# Patient Record
Sex: Male | Born: 1970 | Race: Black or African American | Hispanic: No | Marital: Married | Smoking: Never smoker
Health system: Southern US, Community
[De-identification: ages and names within clinical notes are randomized; demographics above are authoritative.]

## PROBLEM LIST (undated history)

## (undated) DIAGNOSIS — K5790 Diverticulosis of intestine, part unspecified, without perforation or abscess without bleeding: Secondary | ICD-10-CM

## (undated) DIAGNOSIS — I10 Essential (primary) hypertension: Secondary | ICD-10-CM

## (undated) DIAGNOSIS — M542 Cervicalgia: Secondary | ICD-10-CM

## (undated) DIAGNOSIS — N390 Urinary tract infection, site not specified: Secondary | ICD-10-CM

## (undated) DIAGNOSIS — M109 Gout, unspecified: Secondary | ICD-10-CM

## (undated) DIAGNOSIS — Z87442 Personal history of urinary calculi: Secondary | ICD-10-CM

## (undated) DIAGNOSIS — F419 Anxiety disorder, unspecified: Secondary | ICD-10-CM

## (undated) DIAGNOSIS — K219 Gastro-esophageal reflux disease without esophagitis: Secondary | ICD-10-CM

## (undated) DIAGNOSIS — K3184 Gastroparesis: Secondary | ICD-10-CM

## (undated) DIAGNOSIS — G8929 Other chronic pain: Secondary | ICD-10-CM

## (undated) DIAGNOSIS — G4730 Sleep apnea, unspecified: Secondary | ICD-10-CM

## (undated) DIAGNOSIS — M25569 Pain in unspecified knee: Secondary | ICD-10-CM

## (undated) DIAGNOSIS — F32A Depression, unspecified: Secondary | ICD-10-CM

## (undated) DIAGNOSIS — R51 Headache: Secondary | ICD-10-CM

## (undated) DIAGNOSIS — R519 Headache, unspecified: Secondary | ICD-10-CM

## (undated) DIAGNOSIS — M1990 Unspecified osteoarthritis, unspecified site: Secondary | ICD-10-CM

## (undated) DIAGNOSIS — K760 Fatty (change of) liver, not elsewhere classified: Secondary | ICD-10-CM

## (undated) DIAGNOSIS — E119 Type 2 diabetes mellitus without complications: Secondary | ICD-10-CM

## (undated) DIAGNOSIS — R002 Palpitations: Secondary | ICD-10-CM

## (undated) DIAGNOSIS — E669 Obesity, unspecified: Secondary | ICD-10-CM

## (undated) DIAGNOSIS — N200 Calculus of kidney: Secondary | ICD-10-CM

## (undated) DIAGNOSIS — E785 Hyperlipidemia, unspecified: Secondary | ICD-10-CM

## (undated) HISTORY — DX: Fatty (change of) liver, not elsewhere classified: K76.0

## (undated) HISTORY — DX: Palpitations: R00.2

## (undated) HISTORY — DX: Essential (primary) hypertension: I10

## (undated) HISTORY — DX: Unspecified osteoarthritis, unspecified site: M19.90

## (undated) HISTORY — DX: Hyperlipidemia, unspecified: E78.5

## (undated) HISTORY — DX: Sleep apnea, unspecified: G47.30

## (undated) HISTORY — DX: Gout, unspecified: M10.9

## (undated) HISTORY — PX: ESOPHAGOGASTRODUODENOSCOPY ENDOSCOPY: SHX5814

## (undated) HISTORY — DX: Pain in unspecified knee: M25.569

## (undated) HISTORY — DX: Urinary tract infection, site not specified: N39.0

## (undated) HISTORY — PX: KIDNEY STONE SURGERY: SHX686

## (undated) HISTORY — DX: Headache, unspecified: R51.9

## (undated) HISTORY — DX: Obesity, unspecified: E66.9

## (undated) HISTORY — DX: Type 2 diabetes mellitus without complications: E11.9

## (undated) HISTORY — DX: Headache: R51

## (undated) HISTORY — DX: Calculus of kidney: N20.0

---

## 2000-08-10 ENCOUNTER — Emergency Department (HOSPITAL_COMMUNITY): Admission: EM | Admit: 2000-08-10 | Discharge: 2000-08-10 | Payer: Self-pay | Admitting: Emergency Medicine

## 2002-02-13 ENCOUNTER — Emergency Department (HOSPITAL_COMMUNITY): Admission: EM | Admit: 2002-02-13 | Discharge: 2002-02-13 | Payer: Self-pay | Admitting: Emergency Medicine

## 2002-03-03 ENCOUNTER — Emergency Department (HOSPITAL_COMMUNITY): Admission: EM | Admit: 2002-03-03 | Discharge: 2002-03-03 | Payer: Self-pay | Admitting: *Deleted

## 2002-10-19 ENCOUNTER — Encounter: Payer: Self-pay | Admitting: Emergency Medicine

## 2002-10-19 ENCOUNTER — Emergency Department (HOSPITAL_COMMUNITY): Admission: EM | Admit: 2002-10-19 | Discharge: 2002-10-19 | Payer: Self-pay | Admitting: Emergency Medicine

## 2003-01-01 ENCOUNTER — Emergency Department (HOSPITAL_COMMUNITY): Admission: EM | Admit: 2003-01-01 | Discharge: 2003-01-01 | Payer: Self-pay | Admitting: Emergency Medicine

## 2003-02-12 ENCOUNTER — Ambulatory Visit: Admission: RE | Admit: 2003-02-12 | Discharge: 2003-02-12 | Payer: Self-pay | Admitting: Family Medicine

## 2003-05-18 ENCOUNTER — Ambulatory Visit (HOSPITAL_COMMUNITY): Admission: RE | Admit: 2003-05-18 | Discharge: 2003-05-18 | Payer: Self-pay | Admitting: Family Medicine

## 2003-10-11 ENCOUNTER — Ambulatory Visit (HOSPITAL_COMMUNITY): Admission: RE | Admit: 2003-10-11 | Discharge: 2003-10-12 | Payer: Self-pay | Admitting: Family Medicine

## 2004-03-02 ENCOUNTER — Ambulatory Visit (HOSPITAL_COMMUNITY): Admission: RE | Admit: 2004-03-02 | Discharge: 2004-03-02 | Payer: Self-pay | Admitting: Pediatrics

## 2004-03-25 ENCOUNTER — Emergency Department (HOSPITAL_COMMUNITY): Admission: EM | Admit: 2004-03-25 | Discharge: 2004-03-25 | Payer: Self-pay | Admitting: Emergency Medicine

## 2004-09-08 ENCOUNTER — Ambulatory Visit (HOSPITAL_COMMUNITY): Admission: RE | Admit: 2004-09-08 | Discharge: 2004-09-08 | Payer: Self-pay | Admitting: Family Medicine

## 2005-10-12 ENCOUNTER — Emergency Department (HOSPITAL_COMMUNITY): Admission: EM | Admit: 2005-10-12 | Discharge: 2005-10-12 | Payer: Self-pay | Admitting: Emergency Medicine

## 2006-09-05 ENCOUNTER — Ambulatory Visit (HOSPITAL_COMMUNITY): Admission: RE | Admit: 2006-09-05 | Discharge: 2006-09-05 | Payer: Self-pay | Admitting: Family Medicine

## 2007-12-07 ENCOUNTER — Emergency Department (HOSPITAL_COMMUNITY): Admission: EM | Admit: 2007-12-07 | Discharge: 2007-12-07 | Payer: Self-pay | Admitting: Emergency Medicine

## 2009-01-25 ENCOUNTER — Emergency Department (HOSPITAL_COMMUNITY): Admission: EM | Admit: 2009-01-25 | Discharge: 2009-01-25 | Payer: Self-pay | Admitting: Emergency Medicine

## 2009-04-24 ENCOUNTER — Emergency Department (HOSPITAL_COMMUNITY): Admission: EM | Admit: 2009-04-24 | Discharge: 2009-04-24 | Payer: Self-pay | Admitting: Emergency Medicine

## 2009-05-13 ENCOUNTER — Inpatient Hospital Stay (HOSPITAL_COMMUNITY): Admission: EM | Admit: 2009-05-13 | Discharge: 2009-05-16 | Payer: Self-pay | Admitting: Emergency Medicine

## 2009-08-22 ENCOUNTER — Encounter: Admission: RE | Admit: 2009-08-22 | Discharge: 2009-08-22 | Payer: Self-pay | Admitting: Family Medicine

## 2009-09-09 ENCOUNTER — Ambulatory Visit: Payer: Self-pay | Admitting: Adult Health

## 2009-09-09 DIAGNOSIS — I10 Essential (primary) hypertension: Secondary | ICD-10-CM

## 2009-09-09 DIAGNOSIS — G4730 Sleep apnea, unspecified: Secondary | ICD-10-CM

## 2009-09-09 DIAGNOSIS — G4733 Obstructive sleep apnea (adult) (pediatric): Secondary | ICD-10-CM | POA: Insufficient documentation

## 2009-09-09 HISTORY — DX: Essential (primary) hypertension: I10

## 2009-09-09 HISTORY — DX: Sleep apnea, unspecified: G47.30

## 2009-09-29 ENCOUNTER — Encounter: Payer: Self-pay | Admitting: Cardiology

## 2009-09-30 ENCOUNTER — Ambulatory Visit: Payer: Self-pay | Admitting: Cardiology

## 2009-09-30 ENCOUNTER — Encounter: Payer: Self-pay | Admitting: Cardiology

## 2009-09-30 ENCOUNTER — Ambulatory Visit (HOSPITAL_COMMUNITY): Admission: RE | Admit: 2009-09-30 | Discharge: 2009-09-30 | Payer: Self-pay | Admitting: Cardiology

## 2009-09-30 ENCOUNTER — Ambulatory Visit: Payer: Self-pay

## 2009-10-06 ENCOUNTER — Ambulatory Visit: Payer: Self-pay | Admitting: Cardiology

## 2009-10-28 ENCOUNTER — Ambulatory Visit: Admission: RE | Admit: 2009-10-28 | Discharge: 2009-10-28 | Payer: Self-pay | Admitting: Cardiology

## 2009-11-02 ENCOUNTER — Telehealth (INDEPENDENT_AMBULATORY_CARE_PROVIDER_SITE_OTHER): Payer: Self-pay | Admitting: *Deleted

## 2009-11-17 ENCOUNTER — Ambulatory Visit: Payer: Self-pay | Admitting: Cardiology

## 2009-11-18 ENCOUNTER — Encounter: Payer: Self-pay | Admitting: Adult Health

## 2009-11-22 ENCOUNTER — Encounter: Payer: Self-pay | Admitting: Cardiology

## 2010-01-06 ENCOUNTER — Ambulatory Visit: Payer: Self-pay | Admitting: Cardiology

## 2010-02-10 ENCOUNTER — Encounter: Payer: Self-pay | Admitting: Adult Health

## 2010-02-12 ENCOUNTER — Encounter: Payer: Self-pay | Admitting: Family Medicine

## 2010-02-20 ENCOUNTER — Ambulatory Visit
Admission: RE | Admit: 2010-02-20 | Discharge: 2010-02-20 | Payer: Self-pay | Source: Home / Self Care | Attending: Cardiology | Admitting: Cardiology

## 2010-02-21 NOTE — Miscellaneous (Signed)
Summary: Orders Update  Clinical Lists Changes  Orders: Added new Test order of Renal Artery Duplex (Renal Artery Duplex) - Signed 

## 2010-02-21 NOTE — Assessment & Plan Note (Signed)
Summary: 4 wk f/u per checkout on 09/09/09/tg   Visit Type:  Follow-up Primary Provider:  Dr.Mcgowen  CC:  some edema in feet and ankles.  History of Present Illness: Nicholas Turner is a very pleasant morbidly obese AAM who we are seeing on follow-up after evaluation at the request of Dr. Milinda Cave for uncontrolled hypertension.  At that office visit we planned a sleep study, echocardiogram and increased his Doxazosin from 4mg  to 8 mg, and also added Norvac 10mg  daily.  He statea he is feeling better with less headaches and chest fullness.  Unfortunatley the sleep study was not completed or scheduled since last visit. This is to be rescheduled. He also had a renal ultrasound completed but was nondiagnostic secondary to poor images in the setting of large body habitus.  Current Medications (verified): 1)  Oxycodone Hcl 5 Mg Tabs (Oxycodone Hcl) .... Take 1 To 2 Tabs As Needed For Pain 2)  Metoprolol Tartrate 100 Mg Tabs (Metoprolol Tartrate) .... Take 1 Tab Two Times A Day 3)  Allopurinol 300 Mg Tabs (Allopurinol) .... Take 1 Tab Two Times A Day 4)  Glimepiride 4 Mg Tabs (Glimepiride) .... Take 1 Tab Dialy 5)  Colcrys 0.6 Mg Tabs (Colchicine) .... Take 1 Tab Two Times A Day 6)  Pravastatin Sodium 20 Mg Tabs (Pravastatin Sodium) .... Take 1 Tab Daily 7)  Lisinopril 20 Mg Tabs (Lisinopril) .... Take 1 Tab Two Times A Day 8)  Hydrochlorothiazide 25 Mg Tabs (Hydrochlorothiazide) .... Take 1 Tab Daily 9)  Doxazosin Mesylate 8 Mg Tabs (Doxazosin Mesylate) .... Take 1 Tablet By Mouth Once Daily 10)  Norvasc 10 Mg Tabs (Amlodipine Besylate) .... Take 1 Tablet By Mouth Once Daily  Allergies (verified): No Known Drug Allergies  Review of Systems       All other systems have been reviewed and are negative unless stated above.   Vital Signs:  Patient profile:   40 year old male Weight:      332 pounds BMI:     50.66 Pulse rate:   69 / minute BP sitting:   153 / 95  (right arm)  Vitals Entered By:  Dreama Saa, CNA (October 06, 2009 3:05 PM)  Physical Exam  General:  Well developed, well nourished, in no acute distress. Lungs:  Clear bilaterally to auscultation and percussion. Heart:  Distant RRR Abdomen:  Obese, nontender with normal bowel sounds Msk:  Back normal, normal gait. Muscle strength and tone normal. Extremities:  trace left pedal edema and trace right pedal edema.   Neurologic:  Alert and oriented x 3. Psych:  Normal affect.   Impression & Recommendations:  Problem # 1:  HYPERTENSION, UNCONTROLLED (ICD-401.9) His blood pressure is significantly improved with medication changes!  We will make sure before he leaves today to have sleep study completed.  Echocardiogram demonstrated milld LVH, EF 55%-60%, No WMA. Diastolic dysfx was noted, Grade 2. I have counseled him on low sodium diet, written instructions were provided to take home.  I am encourged by his BP improvemnt, but will not add more medications until after sleep study is completed. His updated medication list for this problem includes:    Metoprolol Tartrate 100 Mg Tabs (Metoprolol tartrate) .Marland Kitchen... Take 1 tab two times a day    Lisinopril 20 Mg Tabs (Lisinopril) .Marland Kitchen... Take 1 tab two times a day    Hydrochlorothiazide 25 Mg Tabs (Hydrochlorothiazide) .Marland Kitchen... Take 1 tab daily    Doxazosin Mesylate 8 Mg Tabs (Doxazosin mesylate) .Marland Kitchen... Take  1 tablet by mouth once daily    Norvasc 10 Mg Tabs (Amlodipine besylate) .Marland Kitchen... Take 1 tablet by mouth once daily  Problem # 2:  SLEEP APNEA (ICD-780.57) As above.  Patient Instructions: 1)  Your physician recommends that you schedule a follow-up appointment in: post sleep study 2)  Your physician recommends that you continue on your current medications as directed. Please refer to the Current Medication list given to you today. 3)  Your physician has recommended that you have a sleep study.  This test records several body functions during sleep, including:  brain activity, eye  movement, oxygen and carbon dioxide blood levels, heart rate and rhythm, breathing rate and rhythm, the flow of air through your mouth and nose, snoring, body muscle movements, and chest and belly movement. 4)  You have been given a copy of the Dash diet to help you cut down on sodium and improve yor blood pressure

## 2010-02-21 NOTE — Progress Notes (Signed)
Summary: WAS TOLD TO CALL AFTER SLEPP STUDY WAS DONE  Phone Note Call from Patient Call back at Home Phone [phone]   Caller: PT Reason for Call: Talk to Nurse Summary of Call: PT WAS TOLD TO CALL BACK AFTER HAVING SLEEP STUDY DONE. IT WAS DONE LAST FRIDAY AND THEY ALREADY HAVE RESULTS. PT IS UNSURE WHY KATHRYN LAWRENCE WANTED HIM TO CALL BACK.  I TOLD PT I WOULD HAVE A NURSE TALK TO KATHRYN ON THURSDAY AND SEE WHAT NEEDS TO HAPPEN AND GIVE HIM A CALL BACK. Initial call taken by: Faythe Ghee,  November 02, 2009 2:45 PM  Follow-up for Phone Call        sent message to [facility] [phone] Follow-up by: Teressa Lower RN,  November 04, 2009 4:49 PM  Additional Follow-up for Phone Call Additional follow up Details #1::        [facility] for pt to schedule follow up for results of sleep study Additional Follow-up by: Teressa Lower RN,  November 08, 2009 2:18 PM

## 2010-02-21 NOTE — Assessment & Plan Note (Signed)
Summary: NP6 UNCONTROLLED HYPERTENTION   Visit Type:  Initial Consult Primary Provider:  Dr.Mcgowen  CC:  edema occasionally in feet and legs.  History of Present Illness: [name] is a 40 year old obese AAM who we are seeing on consult as the result of difficult ot control hypertension on multiple medical trails. 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  Since discharge he has been continually hypertensive.  We are asked to evaluate and make recommendations.  He denies chest pain, but has chronic musculoskeletal pain, especially in his knees.  He is not able to exercise as a result of this. He admits to snoring and difficulty sleeping.  He has not had a cardiac w/u in the past.    Preventive Screening-Counseling & Management  Alcohol-Tobacco     Alcohol drinks/day: 0     Smoking Status: never  Caffeine-Diet-Exercise     Diet Counseling: to improve diet; diet is suboptimal     Does Patient Exercise: no  Current Medications (verified): 1)  Oxycodone Hcl 5 Mg Tabs (Oxycodone Hcl) .... Take 1 To 2 Tabs As Needed For Pain 2)  Metoprolol Tartrate 100 Mg Tabs (Metoprolol Tartrate) .... Take 1 Tab Two Times A Day 3)  Allopurinol 300 Mg Tabs (Allopurinol) .... Take 1 Tab Two Times A Day 4)  Glimepiride 4 Mg Tabs (Glimepiride) .... Take 1 Tab Dialy 5)  Colcrys 0.6 Mg Tabs (Colchicine) .... Take 1 Tab Two Times A Day 6)  Pravastatin Sodium 20 Mg Tabs (Pravastatin Sodium) .... Take 1 Tab Daily 7)  Lisinopril 20 Mg Tabs (Lisinopril) .... Take 1 Tab Two Times A Day 8)  Hydrochlorothiazide 25 Mg Tabs (Hydrochlorothiazide) .... Take 1 Tab Daily 9)  Doxazosin Mesylate 8 Mg Tabs (Doxazosin Mesylate) .... Take 1 Tablet By Mouth Once Daily 10)  Norvasc 10 Mg Tabs (Amlodipine Besylate) .... Take 1 Tablet By Mouth Once Daily  Allergies (verified): No Known Drug Allergies  Family History: Father:deceased in his 87's due to presumed  myocardial infarction ESRD, Hypertension Mother:alive age 67 hypertension and some thyroid problems  Social History: Full Time-P[employer] Married  Tobacco Use - No.  Alcohol Use - no Regular Exercise - no Drug Use - no Alcohol drinks/day:  0  Review of Systems       Musculoskeletal pain.  Fatigue  All other systems have been reviewed and are negative unless stated above.   Vital Signs:  Patient profile:   40 year old male Height:      68 inches Weight:      327 pounds BMI:     49.90 Pulse rate:   58 / minute BP sitting:   198 / 105  (right arm)  Vitals Entered By: Dreama Saa, CNA (September 09, 2009 2:13 PM)  Physical Exam  General:  Well developed, well nourished, in no acute distress. Head:  normocephalic and atraumatic Eyes:  PERRLA/EOM intact; conjunctiva and lids normal. Neck:  Obese Lungs:  Clear bilaterally to auscultation and percussion. Heart:  Distant HS, unable to hear S4 murmur Abdomen:  Bowel sounds positive; abdomen soft and non-tender without masses, organomegaly, or hernias noted. No hepatosplenomegaly.Obese Msk:  Back normal, normal gait. Muscle strength and tone normal. Pulses:  pulses normal in all 4 extremities Extremities:  No clubbing or cyanosis. Neurologic:  Alert and oriented x 3. Psych:  Normal affect.   EKG  Procedure date:  09/09/2009  Findings:      Normal sinus  rhythm with rate of: 63 bpm Non-specific ST-T wave changes noted.  Left ventricular hypertrophy.    Impression & Recommendations:  Problem # 1:  HYPERTENSION, UNCONTROLLED (ICD-401.9) Mr. 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  We will add Norvasc 10mg  daily, increase doxazosin to 8mg  from 4 mg daily.  A renal ultrasound will be completed at the [facility] cardiology office along with an ECHO for LV fx.  High risk for end organ damange if BP is not controlled This  has been discussed with the patient and his wife by me and by Dr. Dietrich Pates. He is to bring his BP cuff with him on next visit to compare readings to ours. His updated medication list for this problem includes:    Metoprolol Tartrate 100 Mg Tabs (Metoprolol tartrate) .Marland Kitchen... Take 1 tab two times a day    Lisinopril 20 Mg Tabs (Lisinopril) .Marland Kitchen... Take 1 tab two times a day    Hydrochlorothiazide 25 Mg Tabs (Hydrochlorothiazide) .Marland Kitchen... Take 1 tab daily    Doxazosin Mesylate 8 Mg Tabs (Doxazosin mesylate) .Marland Kitchen... Take 1 tablet by mouth once daily    Norvasc 10 Mg Tabs (Amlodipine besylate) .Marland Kitchen... Take 1 tablet by mouth once daily  Orders: Renal Artery Duplex (Renal Artery Duplex) 2-D Echocardiogram (2D Echo)  Problem # 2:  SLEEP APNEA (ICD-780.57) He admits to snoring and his wife states he stops breathing.  He certainly has the body habitus to have this syndrome.  Sleep study will be completed for evaluation for sleep apnea.  Wt loss is strongly recommended, with consideration for bariatric surgery if necessary. Orders: Sleep Study Other (Sleep Study Other)  Patient Instructions: 1)  Your physician recommends that you schedule a follow-up appointment in: 2 to 3 weeks (post tests) 2)  Your physician has recommended you make the following change in your medication: Start taking Norvasc (amlodipine 10mg  by mouth once daily  and increase Doxazosin to 8mg  by mouth once daily  3)  Your physician has requested that you have a renal artery duplex. During this test, an ultrasound is used to evaluate blood flow to the kidneys. Allow one hour for this exam. Do not eat after midnight the day before and avoid carbonated beverages. Take your medications as you usually do. 4)  Your physician has requested that you have an echocardiogram.  Echocardiography is a painless test that uses sound waves to create images of your heart. It provides your doctor with information about the size and shape of your heart and how well  your heart's chambers and valves are working.  This procedure takes approximately one hour. There are no restrictions for this procedure. 5)  Your physician has recommended that you have a sleep study.  This test records several body functions during sleep, including:  brain activity, eye movement, oxygen and carbon dioxide blood levels, heart rate and rhythm, breathing rate and rhythm, the flow of air through your mouth and nose, snoring, body muscle movements, and chest and belly movement. Prescriptions: DOXAZOSIN MESYLATE 8 MG TABS (DOXAZOSIN MESYLATE) take 1 tablet by mouth once daily  #30 x 3   Entered by:   Larita Fife Via LPN   Authorized by:   Joni Reining, NP   Signed by:   Larita Fife Via LPN on 16/10/9602   Method used:   Print then Give to Patient   RxID:   [id number] NORVASC 10 MG TABS (AMLODIPINE BESYLATE)  take 1 tablet by mouth once daily  #30 x 3   Entered by:   Larita Fife Via LPN   Authorized by:   Joni Reining, NP   Signed by:   Larita Fife Via LPN on 16/10/9602   Method used:   Print then Give to Patient   RxID:   [id number]

## 2010-02-21 NOTE — Assessment & Plan Note (Signed)
Summary: rov post sleep study   Primary Provider:  Dr.Mcgowen   History of Present Illness: Nicholas Turner is a pleasnt 40 y/o morbidly obese AAF with history of difficult to control hypertension on multiple medications and diabetes.  We sent him for a sleep study on last visit and he is here to discuss the results.  He has been medically compliant and is without complaint.  Current Medications (verified): 1)  Oxycodone Hcl 5 Mg Tabs (Oxycodone Hcl) .... Take 1 To 2 Tabs As Needed For Pain 2)  Metoprolol Tartrate 100 Mg Tabs (Metoprolol Tartrate) .... Take 1 Tab Two Times A Day 3)  Allopurinol 300 Mg Tabs (Allopurinol) .... Take 1 Tab Two Times A Day 4)  Glimepiride 4 Mg Tabs (Glimepiride) .... Take 1 Tab Dialy 5)  Colcrys 0.6 Mg Tabs (Colchicine) .... Take 1 Tab Two Times A Day 6)  Pravastatin Sodium 20 Mg Tabs (Pravastatin Sodium) .... Take 1 Tab Daily 7)  Lisinopril 20 Mg Tabs (Lisinopril) .... Take 1 Tab Two Times A Day 8)  Hydrochlorothiazide 25 Mg Tabs (Hydrochlorothiazide) .... Take 1 Tab Daily 9)  Doxazosin Mesylate 8 Mg Tabs (Doxazosin Mesylate) .... Take 1 Tablet By Mouth Once Daily 10)  Norvasc 10 Mg Tabs (Amlodipine Besylate) .... Take 1 Tablet By Mouth Once Daily  Allergies (verified): No Known Drug Allergies  Comments:  Nurse/Medical Assistant: patient reviewed meds from previous ov and stated meds are correct  Review of Systems       All other systems have been reviewed and are negative unless stated above.   Vital Signs:  Patient profile:   40 year old male Weight:      341 pounds BMI:     52.04 O2 Sat:      97 % on Room air Pulse rate:   67 / minute BP sitting:   158 / 98  (right arm)  Vitals Entered By: Dreama Saa, CNA (November 17, 2009 3:45 PM)  O2 Flow:  Room air  Physical Exam  General:  Well developed, well nourished, in no acute distress. Lungs:  Clear bilaterally to auscultation and percussion. Heart:  Non-displaced PMI, chest non-tender;  regular rate and rhythm, S1, S2 without murmurs, rubs or gallops. Carotid upstroke normal, no bruit. Normal abdominal aortic size, no bruits. Femorals normal pulses, no bruits. Pedals normal pulses. No edema, no varicosities. Pulses:  pulses normal in all 4 extremities Extremities:  No clubbing or cyanosis. Psych:  Normal affect.   Impression & Recommendations:  Problem # 1:  SLEEP APNEA (ICD-780.57) Review of sleep study completed on 10/28/2009 demonstrated moderate obstructive sleep apnea.  Average HR of 56 bpm with no significant dysrhythmias noted.  O2 saturation at lowest was 76% during REM sleep.  I have advised CPAP fitting.  He agrees to try this and is willing to proceed with fitting at [facility] in [place].Marland Kitchen He will follow up with primary care physician for continued assessment. Orders: Durable Medical Equipment (DME)  Problem # 2:  HYPERTENSION, UNCONTROLLED (ICD-401.9) Continues moderately elevated. Will not adjust meds at this time as CPAP will be institued.  If sleep apnea improves will need to titrate medications down to prevent hyptension.  Will will followhim in one month and 3 months intervals. His updated medication list for this problem includes:    Metoprolol Tartrate 100 Mg Tabs (Metoprolol tartrate) .Marland Kitchen... Take 1 tab two times a day    Lisinopril 20 Mg Tabs (Lisinopril) .Marland Kitchen... Take 1 tab two times a day  Hydrochlorothiazide 25 Mg Tabs (Hydrochlorothiazide) .Marland Kitchen... Take 1 tab daily    Doxazosin Mesylate 8 Mg Tabs (Doxazosin mesylate) .Marland Kitchen... Take 1 tablet by mouth once daily    Norvasc 10 Mg Tabs (Amlodipine besylate) .Marland Kitchen... Take 1 tablet by mouth once daily  Patient Instructions: 1)  Your physician recommends that you schedule a follow-up appointment in: after cpap machine

## 2010-02-23 NOTE — Assessment & Plan Note (Signed)
Summary: [id number]   Visit Type:  Follow-up Primary Provider:  Dr.Mcgowen  CC:  no cardiology complaints.  History of Present Illness: Nicholas Turner is a friendly AAM we are following for uncontrolled hypertension. Since last visit he was sent for a sleep study as he is morbidly obese and often snores.  He was found to have moderate sleep apnea and qualifies for CPAP.  He is without complaint and is feeling better.    Current Medications (verified): 1)  Metoprolol Tartrate 100 Mg Tabs (Metoprolol Tartrate) .... Take 1 Tab Two Times A Day 2)  Allopurinol 300 Mg Tabs (Allopurinol) .... Take 1 Tab Two Times A Day 3)  Glimepiride 4 Mg Tabs (Glimepiride) .... Take 1 Tab Dialy 4)  Colcrys 0.6 Mg Tabs (Colchicine) .... Take 1 Tab Two Times A Day 5)  Pravastatin Sodium 20 Mg Tabs (Pravastatin Sodium) .... Take 1 Tab Daily 6)  Lisinopril 20 Mg Tabs (Lisinopril) .... Take 1 Tab Two Times A Day 7)  Hydrochlorothiazide 25 Mg Tabs (Hydrochlorothiazide) .... Take 1 Tab Daily 8)  Doxazosin Mesylate 8 Mg Tabs (Doxazosin Mesylate) .... Take 1 Tablet By Mouth Once Daily 9)  Norvasc 10 Mg Tabs (Amlodipine Besylate) .... Take 1 Tablet By Mouth Once Daily  Allergies (verified): No Known Drug Allergies  Comments:  Nurse/Medical Assistant: [facility] in [place] is patients pharmacy reviewed meds from previous ov  Review of Systems       All other systems have been reviewed and are negative unless stated above.    Vital Signs:  Patient profile:   40 year old male Weight:      347 pounds O2 Sat:      95 % on Room air Pulse rate:   68 / minute BP sitting:   139 / 91  (right arm)  Vitals Entered By: Dreama Saa, CNA (January 06, 2010 3:07 PM)  O2 Flow:  Room air  Physical Exam  General:  Well developed, well nourished, in no acute distress. Lungs:  Clear bilaterally to auscultation and percussion. Heart:  Non-displaced PMI, chest non-tender; regular rate and rhythm, S1, S2 without murmurs, rubs  or gallops. Carotid upstroke normal, no bruit. Normal abdominal aortic size, no bruits. Femorals normal pulses, no bruits. Pedals normal pulses. No edema, no varicosities. Abdomen:  Bowel sounds positive; abdomen soft and non-tender without masses, organomegaly, or hernias noted. No hepatosplenomegaly. Extremities:  No clubbing or cyanosis. Neurologic:  Alert and oriented x 3.   Impression & Recommendations:  Problem # 1:  SLEEP APNEA (ICD-780.57) He is having a great deal of difficulty obtaining a CPAP machine.  He has no insurance.  We are referring him to [facility] who are willing to work with him and give him a discount concerning the cost of the the machine.  He will follow-up within a month of beginning this therapy.  Problem # 2:  HYPERTENSION, UNCONTROLLED (ICD-401.9) Much better controlled on current medications.  I am pleased with his progress despite not having the CPAP on board to assist.  Once he able to percure CPAP he will need follow-up in one month to titrate medications. He is also encouraged to lose wt and exercise more. His updated medication list for this problem includes:    Metoprolol Tartrate 100 Mg Tabs (Metoprolol tartrate) .Marland Kitchen... Take 1 tab two times a day    Lisinopril 20 Mg Tabs (Lisinopril) .Marland Kitchen... Take 1 tab two times a day    Hydrochlorothiazide 25 Mg Tabs (Hydrochlorothiazide) .Marland Kitchen... Take 1  tab daily    Doxazosin Mesylate 8 Mg Tabs (Doxazosin mesylate) .Marland Kitchen... Take 1 tablet by mouth once daily    Norvasc 10 Mg Tabs (Amlodipine besylate) .Marland Kitchen... Take 1 tablet by mouth once daily  Other Orders: Durable Medical Equipment (DME) Home Health Referral (Home Health)  Patient Instructions: 1)  Your physician recommends that you schedule a follow-up appointment in: 1 month 2)  Your physician recommends that you continue on your current medications as directed. Please refer to the Current Medication list given to you today. 3)  You have been referred to [facility]  for CPAP machine.  Appended Document: Orders Update    Clinical Lists Changes  Orders: Added new Test order of Durable Medical Equipment (DME) - Signed

## 2010-02-23 NOTE — Miscellaneous (Signed)
Summary: CPAP  CPAP   Imported By: Faythe Ghee 02/10/2010 11:06:43  _____________________________________________________________________  External Attachment:    Type:   Image     Comment:   External Document

## 2010-03-01 NOTE — Assessment & Plan Note (Signed)
Summary: 1 mth f/u per checkout on 01/06/10/tg   Visit Type:  Follow-up Primary Provider:  Dr.Mcgowen   History of Present Illness: Nicholas Turner is a very nice morbidly obese AAM we are following for difficult to control hypertension, OSA recently placed on CPAP.  He has recently obtained CPAP and has been using it over the last few weeks. He expresses much better sleep and energy levels.  His sleep has not been interrupted and he is very pleased with this.  He is without complaints.  Current Medications (verified): 1)  Metoprolol Tartrate 100 Mg Tabs (Metoprolol Tartrate) .... Take 1 Tab Two Times A Day 2)  Allopurinol 300 Mg Tabs (Allopurinol) .... Take 1 Tab Two Times A Day 3)  Glimepiride 4 Mg Tabs (Glimepiride) .... Take 1 Tab Dialy 4)  Colcrys 0.6 Mg Tabs (Colchicine) .... Take 1 Tab Two Times A Day 5)  Pravastatin Sodium 20 Mg Tabs (Pravastatin Sodium) .... Take 1 Tab Daily 6)  Lisinopril 20 Mg Tabs (Lisinopril) .... Take 1 Tab Two Times A Day 7)  Hydrochlorothiazide 25 Mg Tabs (Hydrochlorothiazide) .... Take 1 Tab Daily 8)  Doxazosin Mesylate 8 Mg Tabs (Doxazosin Mesylate) .... Take 1 Tablet By Mouth Once Daily 9)  Norvasc 10 Mg Tabs (Amlodipine Besylate) .... Take 1 Tablet By Mouth Once Daily  Allergies (verified): No Known Drug Allergies  Comments:  Nurse/Medical Assistant: patient and i reviewed meds no list brought metoprolol bottle he is now on 100 mg two times a day instead of 200 mg two times a day [facility] in [place]  Review of Systems       All other systems have been reviewed and are negative unless stated above.   Vital Signs:  Patient profile:   40 year old male Weight:      358 pounds BMI:     54.63 O2 Sat:      95 % on Room air Pulse rate:   86 / minute BP sitting:   136 / 81  (left arm)  Vitals Entered By: Dreama Saa, CNA (February 20, 2010 2:57 PM)  O2 Flow:  Room air  Physical Exam  General:  Well developed, well nourished, in no acute  distress.obese.   Lungs:  Clear bilaterally to auscultation and percussion. Heart:  Non-displaced PMI, chest non-tender; regular rate and rhythm, S1, S2 without murmurs, rubs or gallops. Carotid upstroke normal, no bruit. Normal abdominal aortic size, no bruits. Femorals normal pulses, no bruits. Pedals normal pulses. No edema, no varicosities. Abdomen:  Bowel sounds positive; abdomen soft and non-tender without masses, organomegaly, or hernias noted. No hepatosplenomegaly. Msk:  Back normal, normal gait. Muscle strength and tone normal. Pulses:  pulses normal in all 4 extremities Extremities:  No clubbing or cyanosis. Neurologic:  Alert and oriented x 3. Psych:  Normal affect.   Impression & Recommendations:  Problem # 1:  HYPERTENSION, UNCONTROLLED (ICD-401.9) Blood pressure remains controlled.  I am pleased with this as he is going to have improvement, hopfully every visit since now on CPAP.  He has unforutnately gained about 10 lbs since last visit, before the holidays.  Now that he has better energy he is advised to begin an exercise program. He has a stationary bike at home which he now plans to begin using.  I have advised a day of exercise on this. When the weather gets better, he should try and be outside more and walk.  He verbalizes understanding. Will watch BP closely as use of CPAP  and wt loss my decrease BP.  I will see him every 3 months for a while to manage this. His updated medication list for this problem includes:    Metoprolol Tartrate 100 Mg Tabs (Metoprolol tartrate) .Marland Kitchen... Take 1 tab two times a day    Lisinopril 20 Mg Tabs (Lisinopril) .Marland Kitchen... Take 1 tab two times a day    Hydrochlorothiazide 25 Mg Tabs (Hydrochlorothiazide) .Marland Kitchen... Take 1 tab daily    Doxazosin Mesylate 8 Mg Tabs (Doxazosin mesylate) .Marland Kitchen... Take 1 tablet by mouth once daily    Norvasc 10 Mg Tabs (Amlodipine besylate) .Marland Kitchen... Take 1 tablet by mouth once daily  Problem # 2:  SLEEP APNEA  (ICD-780.57) Use of CPAP is just beginning. He is to continue this every PM to promote better sleep and to keep BP controlled.  Patient Instructions: 1)  Your physician recommends that you schedule a follow-up appointment in: 3 months 2)  Your physician recommends that you continue on your current medications as directed. Please refer to the Current Medication list given to you today. Prescriptions: NORVASC 10 MG TABS (AMLODIPINE BESYLATE) take 1 tablet by mouth once daily  #30 x 12   Entered by:   Larita Fife Via LPN   Authorized by:   Joni Reining, NP   Signed by:   Larita Fife Via LPN on 56/21/3086   Method used:   Electronically to        [facility]  [address] (retail)       [address]       [place], [place]  [address]       Ph: [phone]       Fax: [phone]   RxID:   [id number] DOXAZOSIN MESYLATE 8 MG TABS (DOXAZOSIN MESYLATE) take 1 tablet by mouth once daily  #30 x 12   Entered by:   Larita Fife Via LPN   Authorized by:   Joni Reining, NP   Signed by:   Larita Fife Via LPN on 87/56/4332   Method used:   Electronically to        [facility]  [address] (retail)       [address]       [place], [place]  [address]       Ph: [phone]       Fax: [phone]   RxID:   [id number] HYDROCHLOROTHIAZIDE 25 MG TABS (HYDROCHLOROTHIAZIDE) take 1 tab daily  #30 x 12   Entered by:   Larita Fife Via LPN   Authorized by:   Joni Reining, NP   Signed by:   Larita Fife Via LPN on 83/15/1761   Method used:   Electronically to        [facility]  [address] (retail)       [address]       [place], [place]  [address]       Ph: [phone]       Fax: [phone]   RxID:   [id number] LISINOPRIL 20 MG TABS (LISINOPRIL) take 1 tab two times a day  #60 x 12   Entered by:   Larita Fife Via LPN   Authorized by:   Joni Reining, NP   Signed by:   Larita Fife Via LPN on 81/01/7508   Method used:   Electronically to        [facility]  [address] (retail)  [address]       [place], [place]  [address]       Ph: [phone]       Fax: [phone]   RxID:   [id number] PRAVASTATIN SODIUM 20 MG TABS (PRAVASTATIN SODIUM) take 1 tab daily  #30 x 12   Entered by:   Larita Fife Via LPN   Authorized by:   Joni Reining, NP   Signed by:   Larita Fife Via LPN on 87/56/4332   Method used:   Electronically to        [facility]  [address] (retail)       [address]       [place], [place]  [address]       Ph: [phone]       Fax: [phone]   RxID:   [id number] METOPROLOL TARTRATE 100 MG TABS (METOPROLOL TARTRATE) take 1 tab two times a day  #60 x 12   Entered by:   Larita Fife Via LPN   Authorized by:   Joni Reining, NP   Signed by:   Larita Fife Via LPN on 83/15/1761   Method used:   Electronically to        [facility]  [address] (retail)       [address]       [place], [place]  [address]       Ph: [phone]       Fax: [phone]   RxID:   [id number]

## 2010-03-20 ENCOUNTER — Encounter: Payer: Self-pay | Admitting: Cardiology

## 2010-04-11 LAB — URINALYSIS, ROUTINE W REFLEX MICROSCOPIC
Glucose, UA: 100 mg/dL — AB
Ketones, ur: NEGATIVE mg/dL
Leukocytes, UA: NEGATIVE
Nitrite: NEGATIVE
Protein, ur: 100 mg/dL — AB
Specific Gravity, Urine: >1.03 — ABNORMAL HIGH (ref 1.005–1.030)
Urobilinogen, UA: 4 mg/dL — ABNORMAL HIGH (ref 0.0–1.0)
pH: 5.5 (ref 5.0–8.0)

## 2010-04-11 LAB — DIFFERENTIAL
Basophils Absolute: 0 10*3/uL (ref 0.0–0.1)
Basophils Absolute: 0 10*3/uL (ref 0.0–0.1)
Basophils Absolute: 0 10*3/uL (ref 0.0–0.1)
Basophils Relative: 0 % (ref 0–1)
Basophils Relative: 0 % (ref 0–1)
Basophils Relative: 0 % (ref 0–1)
Eosinophils Absolute: 0 10*3/uL (ref 0.0–0.7)
Eosinophils Absolute: 0 10*3/uL (ref 0.0–0.7)
Eosinophils Absolute: 0.1 10*3/uL (ref 0.0–0.7)
Eosinophils Relative: 0 % (ref 0–5)
Eosinophils Relative: 0 % (ref 0–5)
Eosinophils Relative: 0 % (ref 0–5)
Lymphocytes Relative: 22 % (ref 12–46)
Lymphocytes Relative: 8 % — ABNORMAL LOW (ref 12–46)
Lymphocytes Relative: 8 % — ABNORMAL LOW (ref 12–46)
Lymphs Abs: 1 10*3/uL (ref 0.7–4.0)
Lymphs Abs: 1.1 10*3/uL (ref 0.7–4.0)
Lymphs Abs: 3 10*3/uL (ref 0.7–4.0)
Monocytes Absolute: 0.2 10*3/uL (ref 0.1–1.0)
Monocytes Absolute: 0.5 10*3/uL (ref 0.1–1.0)
Monocytes Absolute: 0.7 10*3/uL (ref 0.1–1.0)
Monocytes Relative: 2 % — ABNORMAL LOW (ref 3–12)
Monocytes Relative: 4 % (ref 3–12)
Monocytes Relative: 5 % (ref 3–12)
Neutro Abs: 10.2 10*3/uL — ABNORMAL HIGH (ref 1.7–7.7)
Neutro Abs: 10.4 10*3/uL — ABNORMAL HIGH (ref 1.7–7.7)
Neutro Abs: 12.3 10*3/uL — ABNORMAL HIGH (ref 1.7–7.7)
Neutrophils Relative %: 73 % (ref 43–77)
Neutrophils Relative %: 87 % — ABNORMAL HIGH (ref 43–77)
Neutrophils Relative %: 90 % — ABNORMAL HIGH (ref 43–77)

## 2010-04-11 LAB — CBC
HCT: 33.8 % — ABNORMAL LOW (ref 39.0–52.0)
HCT: 37.7 % — ABNORMAL LOW (ref 39.0–52.0)
HCT: 38.1 % — ABNORMAL LOW (ref 39.0–52.0)
Hemoglobin: 11.4 g/dL — ABNORMAL LOW (ref 13.0–17.0)
Hemoglobin: 12.8 g/dL — ABNORMAL LOW (ref 13.0–17.0)
Hemoglobin: 12.8 g/dL — ABNORMAL LOW (ref 13.0–17.0)
MCHC: 33.7 g/dL (ref 30.0–36.0)
MCHC: 33.7 g/dL (ref 30.0–36.0)
MCHC: 33.9 g/dL (ref 30.0–36.0)
MCV: 88 fL (ref 78.0–100.0)
MCV: 88.2 fL (ref 78.0–100.0)
MCV: 88.7 fL (ref 78.0–100.0)
Platelets: 329 10*3/uL (ref 150–400)
Platelets: 353 10*3/uL (ref 150–400)
Platelets: 359 10*3/uL (ref 150–400)
RBC: 3.81 MIL/uL — ABNORMAL LOW (ref 4.22–5.81)
RBC: 4.27 MIL/uL (ref 4.22–5.81)
RBC: 4.33 MIL/uL (ref 4.22–5.81)
RDW: 12.7 % (ref 11.5–15.5)
RDW: 12.7 % (ref 11.5–15.5)
RDW: 12.8 % (ref 11.5–15.5)
WBC: 11.9 10*3/uL — ABNORMAL HIGH (ref 4.0–10.5)
WBC: 13.6 10*3/uL — ABNORMAL HIGH (ref 4.0–10.5)
WBC: 13.9 10*3/uL — ABNORMAL HIGH (ref 4.0–10.5)

## 2010-04-11 LAB — LIPID PANEL
Cholesterol: 233 mg/dL — ABNORMAL HIGH (ref 0–200)
HDL: 32 mg/dL — ABNORMAL LOW (ref >39)
LDL Cholesterol: 177 mg/dL — ABNORMAL HIGH (ref 0–99)
Total CHOL/HDL Ratio: 7.3 RATIO
Triglycerides: 121 mg/dL (ref <150)
VLDL: 24 mg/dL (ref 0–40)

## 2010-04-11 LAB — HEPATIC FUNCTION PANEL
ALT: 44 U/L (ref 0–53)
AST: 23 U/L (ref 0–37)
Albumin: 2.6 g/dL — ABNORMAL LOW (ref 3.5–5.2)
Alkaline Phosphatase: 129 U/L — ABNORMAL HIGH (ref 39–117)
Bilirubin, Direct: 0.2 mg/dL (ref 0.0–0.3)
Indirect Bilirubin: 0.5 mg/dL (ref 0.3–0.9)
Total Bilirubin: 0.7 mg/dL (ref 0.3–1.2)
Total Protein: 7.8 g/dL (ref 6.0–8.3)

## 2010-04-11 LAB — GLUCOSE, CAPILLARY
Glucose-Capillary: 117 mg/dL — ABNORMAL HIGH (ref 70–99)
Glucose-Capillary: 147 mg/dL — ABNORMAL HIGH (ref 70–99)
Glucose-Capillary: 147 mg/dL — ABNORMAL HIGH (ref 70–99)
Glucose-Capillary: 152 mg/dL — ABNORMAL HIGH (ref 70–99)
Glucose-Capillary: 154 mg/dL — ABNORMAL HIGH (ref 70–99)
Glucose-Capillary: 155 mg/dL — ABNORMAL HIGH (ref 70–99)
Glucose-Capillary: 161 mg/dL — ABNORMAL HIGH (ref 70–99)
Glucose-Capillary: 189 mg/dL — ABNORMAL HIGH (ref 70–99)
Glucose-Capillary: 200 mg/dL — ABNORMAL HIGH (ref 70–99)
Glucose-Capillary: 206 mg/dL — ABNORMAL HIGH (ref 70–99)
Glucose-Capillary: 245 mg/dL — ABNORMAL HIGH (ref 70–99)
Glucose-Capillary: 252 mg/dL — ABNORMAL HIGH (ref 70–99)
Glucose-Capillary: 262 mg/dL — ABNORMAL HIGH (ref 70–99)

## 2010-04-11 LAB — URINE MICROSCOPIC-ADD ON

## 2010-04-11 LAB — BASIC METABOLIC PANEL
BUN: 11 mg/dL (ref 6–23)
BUN: 14 mg/dL (ref 6–23)
BUN: 16 mg/dL (ref 6–23)
CO2: 29 mEq/L (ref 19–32)
CO2: 30 mEq/L (ref 19–32)
CO2: 32 mEq/L (ref 19–32)
Calcium: 8.7 mg/dL (ref 8.4–10.5)
Calcium: 9.1 mg/dL (ref 8.4–10.5)
Calcium: 9.3 mg/dL (ref 8.4–10.5)
Chloride: 100 mEq/L (ref 96–112)
Chloride: 103 mEq/L (ref 96–112)
Chloride: 96 mEq/L (ref 96–112)
Creatinine, Ser: 0.69 mg/dL (ref 0.4–1.5)
Creatinine, Ser: 0.71 mg/dL (ref 0.4–1.5)
Creatinine, Ser: 0.78 mg/dL (ref 0.4–1.5)
GFR calc Af Amer: >60 mL/min (ref >60)
GFR calc Af Amer: >60 mL/min (ref >60)
GFR calc Af Amer: >60 mL/min (ref >60)
GFR calc non Af Amer: >60 mL/min (ref >60)
GFR calc non Af Amer: >60 mL/min (ref >60)
GFR calc non Af Amer: >60 mL/min (ref >60)
Glucose, Bld: 107 mg/dL — ABNORMAL HIGH (ref 70–99)
Glucose, Bld: 201 mg/dL — ABNORMAL HIGH (ref 70–99)
Glucose, Bld: 216 mg/dL — ABNORMAL HIGH (ref 70–99)
Potassium: 3.8 mEq/L (ref 3.5–5.1)
Potassium: 4.5 mEq/L (ref 3.5–5.1)
Potassium: 4.8 mEq/L (ref 3.5–5.1)
Sodium: 136 mEq/L (ref 135–145)
Sodium: 137 mEq/L (ref 135–145)
Sodium: 137 mEq/L (ref 135–145)

## 2010-04-11 LAB — IRON AND TIBC
Iron: 57 ug/dL (ref 42–135)
Saturation Ratios: 25 % (ref 20–55)
TIBC: 229 ug/dL (ref 215–435)
UIBC: 172 ug/dL

## 2010-04-11 LAB — TSH: TSH: 0.928 u[IU]/mL (ref 0.350–4.500)

## 2010-04-11 LAB — URINE CULTURE
Colony Count: NO GROWTH
Culture: NO GROWTH
Special Requests: POSITIVE

## 2010-04-11 LAB — RHEUMATOID FACTOR: Rhuematoid fact SerPl-aCnc: <20 IU/mL (ref 0–20)

## 2010-04-11 LAB — SEDIMENTATION RATE
Sed Rate: 120 mm/hr — ABNORMAL HIGH (ref 0–16)
Sed Rate: 122 mm/hr — ABNORMAL HIGH (ref 0–16)

## 2010-04-11 LAB — FERRITIN: Ferritin: 1219 ng/mL — ABNORMAL HIGH (ref 22–322)

## 2010-04-11 LAB — HEMOGLOBIN A1C
Hgb A1c MFr Bld: 7.7 % — ABNORMAL HIGH (ref <5.7)
Mean Plasma Glucose: 174 mg/dL — ABNORMAL HIGH (ref <117)

## 2010-04-11 LAB — URIC ACID: Uric Acid, Serum: 3.5 mg/dL — ABNORMAL LOW (ref 4.0–7.8)

## 2010-04-11 LAB — T4, FREE: Free T4: 1.15 ng/dL (ref 0.80–1.80)

## 2010-04-11 LAB — C-REACTIVE PROTEIN: CRP: 5.8 mg/dL — ABNORMAL HIGH (ref <0.6)

## 2010-04-11 LAB — ANA: Anti Nuclear Antibody(ANA): NEGATIVE

## 2010-04-11 LAB — VITAMIN B12: Vitamin B-12: 691 pg/mL (ref 211–911)

## 2010-04-12 LAB — DIFFERENTIAL
Basophils Absolute: 0.2 10*3/uL — ABNORMAL HIGH (ref 0.0–0.1)
Basophils Relative: 2 % — ABNORMAL HIGH (ref 0–1)
Eosinophils Absolute: 0.1 10*3/uL (ref 0.0–0.7)
Eosinophils Relative: 1 % (ref 0–5)
Lymphocytes Relative: 42 % (ref 12–46)
Lymphs Abs: 4.8 10*3/uL — ABNORMAL HIGH (ref 0.7–4.0)
Monocytes Absolute: 0.6 10*3/uL (ref 0.1–1.0)
Monocytes Relative: 5 % (ref 3–12)
Neutro Abs: 5.8 10*3/uL (ref 1.7–7.7)
Neutrophils Relative %: 51 % (ref 43–77)

## 2010-04-12 LAB — BASIC METABOLIC PANEL
BUN: 16 mg/dL (ref 6–23)
CO2: 30 mEq/L (ref 19–32)
Calcium: 9 mg/dL (ref 8.4–10.5)
Chloride: 98 mEq/L (ref 96–112)
Creatinine, Ser: 0.75 mg/dL (ref 0.4–1.5)
GFR calc Af Amer: >60 mL/min (ref >60)
GFR calc non Af Amer: >60 mL/min (ref >60)
Glucose, Bld: 197 mg/dL — ABNORMAL HIGH (ref 70–99)
Potassium: 3.9 mEq/L (ref 3.5–5.1)
Sodium: 137 mEq/L (ref 135–145)

## 2010-04-12 LAB — CBC
HCT: 41.9 % (ref 39.0–52.0)
Hemoglobin: 13.7 g/dL (ref 13.0–17.0)
MCHC: 32.8 g/dL (ref 30.0–36.0)
MCV: 89.2 fL (ref 78.0–100.0)
Platelets: 336 10*3/uL (ref 150–400)
RBC: 4.7 MIL/uL (ref 4.22–5.81)
RDW: 12.9 % (ref 11.5–15.5)
WBC: 11.5 10*3/uL — ABNORMAL HIGH (ref 4.0–10.5)

## 2010-04-12 LAB — URIC ACID: Uric Acid, Serum: 7.1 mg/dL (ref 4.0–7.8)

## 2010-05-10 ENCOUNTER — Ambulatory Visit (INDEPENDENT_AMBULATORY_CARE_PROVIDER_SITE_OTHER): Payer: Self-pay | Admitting: Adult Health

## 2010-05-10 ENCOUNTER — Encounter: Payer: Self-pay | Admitting: Adult Health

## 2010-05-10 DIAGNOSIS — I10 Essential (primary) hypertension: Secondary | ICD-10-CM

## 2010-05-10 DIAGNOSIS — G4730 Sleep apnea, unspecified: Secondary | ICD-10-CM

## 2010-05-10 NOTE — Progress Notes (Signed)
HPI:  Nicholas Turner is a very pleasant morbidly obese AAM we are following with difficult to control hypertension,OSA.  He is compliant with CPAP and medications and on last visit was doing extremely well with BP control.  He comes today feeling very well.  He has lost 30 lbs and is cutting his calories to 1200 a day, eating low fat and no sweets. He states he is feeling much better, his energy is starting to come back. He says that the seat belt actually fits across his stomach now and he in not breathing as hard.  His wife, who is with him, states that he is very strict on his diet and actually is using a iPhone app to track his calories and progress.  He is without complaint.  Allergies not on file  Current Outpatient Prescriptions  Medication Sig Dispense Refill  . allopurinol (ZYLOPRIM) 300 MG tablet Take 300 mg by mouth 2 (two) times daily.        Marland Kitchen amLODipine (NORVASC) 10 MG tablet Take 10 mg by mouth daily.        . colchicine 0.6 MG tablet Take 0.6 mg by mouth 2 (two) times daily.        Marland Kitchen doxazosin (CARDURA) 8 MG tablet Take 8 mg by mouth at bedtime.        Marland Kitchen glimepiride (AMARYL) 4 MG tablet Take 4 mg by mouth daily before breakfast.        . hydrochlorothiazide 25 MG tablet Take 25 mg by mouth daily.        Marland Kitchen lisinopril (PRINIVIL,ZESTRIL) 20 MG tablet Take 20 mg by mouth 2 (two) times daily.        . metoprolol (LOPRESSOR) 100 MG tablet Take 100 mg by mouth 2 (two) times daily.        . pravastatin (PRAVACHOL) 20 MG tablet Take 20 mg by mouth daily.          Past Medical History  Diagnosis Date  . Knee pain     feet   . Gout   . Hypertension   . Diabetes mellitus   . Kidney disease   . Palpitation     Past Surgical History  Procedure Date  . Kidney stone surgery     post ureteral stent and status post lithotripsy (10-15 yrs Ago)    JSE:GBTDVV of systems complete and found to be negative unless listed above  PHYSICAL EXAM General: Well developed, well nourished,obese  in  no acute distress Head: Eyes PERRLA, No xanthomas.   Normal cephalic and atramatic  Lungs: Clear bilaterally to auscultation and percussion. Heart: HRRR S1 S2, .  Pulses are 2+ & equal.            No carotid bruit. No JVD.  No abdominal bruits. No femoral bruits. Abdomen: Bowel sounds are positive, abdomen soft and non-tender without masses or                  Hernia's noted. Msk:  Back normal, normal gait. Normal strength and tone for age. Extremities: No clubbing, cyanosis or edema.  DP +1 Neuro: Alert and oriented X 3. Psych:  Good affect, responds appropriately  EKG:  ASSESSMENT AND PLAN

## 2010-05-10 NOTE — Assessment & Plan Note (Signed)
He is complaint with CPAP and will continue this as long as necessary.

## 2010-05-10 NOTE — Patient Instructions (Signed)
**Note De-Identified Cheria Sadiq Obfuscation** Your physician recommends that you schedule a follow-up appointment in: 3 months  Your physician has requested that you regularly monitor your blood pressure at home. Please use the same machine at around the same time of day everyday.

## 2010-05-10 NOTE — Assessment & Plan Note (Signed)
I am extremely pleased with Nicholas Turner progress.  He has really taken responsibility for his health.  I have encouraged him to continue his low calorie diet and increase his exercise.  I have warned him that with the weight loss, we will have to be more careful with his BP medications and titrate them down as necessary.  He is to take his BP daily. If his BP is persistently lower than 110 systolic or if he is having dizzy spells, he is to call me.  We will see him in 3 months unless symptomatic.

## 2010-05-10 NOTE — Progress Notes (Signed)
Npt

## 2010-06-21 ENCOUNTER — Other Ambulatory Visit: Payer: Self-pay | Admitting: Family Medicine

## 2010-09-01 ENCOUNTER — Emergency Department (HOSPITAL_COMMUNITY): Payer: Medicaid Other

## 2010-09-01 ENCOUNTER — Inpatient Hospital Stay (HOSPITAL_COMMUNITY)
Admission: EM | Admit: 2010-09-01 | Discharge: 2010-09-03 | DRG: 554 | Disposition: A | Discharge status: Home / Self Care | Payer: Medicaid Other | Attending: Internal Medicine | Admitting: Internal Medicine

## 2010-09-01 ENCOUNTER — Encounter (HOSPITAL_COMMUNITY): Payer: Self-pay | Admitting: *Deleted

## 2010-09-01 DIAGNOSIS — E119 Type 2 diabetes mellitus without complications: Secondary | ICD-10-CM

## 2010-09-01 DIAGNOSIS — I10 Essential (primary) hypertension: Secondary | ICD-10-CM

## 2010-09-01 DIAGNOSIS — N390 Urinary tract infection, site not specified: Secondary | ICD-10-CM

## 2010-09-01 DIAGNOSIS — E669 Obesity, unspecified: Secondary | ICD-10-CM

## 2010-09-01 DIAGNOSIS — G4730 Sleep apnea, unspecified: Secondary | ICD-10-CM

## 2010-09-01 DIAGNOSIS — M109 Gout, unspecified: Principal | ICD-10-CM

## 2010-09-01 DIAGNOSIS — M7120 Synovial cyst of popliteal space [Baker], unspecified knee: Secondary | ICD-10-CM | POA: Diagnosis present

## 2010-09-01 HISTORY — DX: Obesity, unspecified: E66.9

## 2010-09-01 HISTORY — DX: Type 2 diabetes mellitus without complications: E11.9

## 2010-09-01 LAB — GLUCOSE, CAPILLARY
Glucose-Capillary: 174 mg/dL — ABNORMAL HIGH (ref 70–99)
Glucose-Capillary: 303 mg/dL — ABNORMAL HIGH (ref 70–99)

## 2010-09-01 LAB — BASIC METABOLIC PANEL
BUN: 15 mg/dL (ref 6–23)
CO2: 27 mEq/L (ref 19–32)
Calcium: 9.2 mg/dL (ref 8.4–10.5)
Chloride: 98 mEq/L (ref 96–112)
Creatinine, Ser: 0.9 mg/dL (ref 0.50–1.35)
GFR calc Af Amer: >60 mL/min (ref >60)
GFR calc non Af Amer: >60 mL/min (ref >60)
Glucose, Bld: 172 mg/dL — ABNORMAL HIGH (ref 70–99)
Potassium: 3.3 mEq/L — ABNORMAL LOW (ref 3.5–5.1)
Sodium: 135 mEq/L (ref 135–145)

## 2010-09-01 LAB — DIFFERENTIAL
Basophils Absolute: 0 10*3/uL (ref 0.0–0.1)
Basophils Relative: 0 % (ref 0–1)
Eosinophils Absolute: 0 10*3/uL (ref 0.0–0.7)
Eosinophils Relative: 0 % (ref 0–5)
Lymphocytes Relative: 16 % (ref 12–46)
Lymphs Abs: 1.7 10*3/uL (ref 0.7–4.0)
Monocytes Absolute: 1.4 10*3/uL — ABNORMAL HIGH (ref 0.1–1.0)
Monocytes Relative: 13 % — ABNORMAL HIGH (ref 3–12)
Neutro Abs: 7.6 10*3/uL (ref 1.7–7.7)
Neutrophils Relative %: 71 % (ref 43–77)

## 2010-09-01 LAB — CBC
HCT: 37.6 % — ABNORMAL LOW (ref 39.0–52.0)
Hemoglobin: 12.3 g/dL — ABNORMAL LOW (ref 13.0–17.0)
MCH: 29.5 pg (ref 26.0–34.0)
MCHC: 32.7 g/dL (ref 30.0–36.0)
MCV: 90.2 fL (ref 78.0–100.0)
Platelets: 218 10*3/uL (ref 150–400)
RBC: 4.17 MIL/uL — ABNORMAL LOW (ref 4.22–5.81)
RDW: 12.8 % (ref 11.5–15.5)
WBC: 10.7 10*3/uL — ABNORMAL HIGH (ref 4.0–10.5)

## 2010-09-01 LAB — URINALYSIS, ROUTINE W REFLEX MICROSCOPIC
Glucose, UA: 100 mg/dL — AB
Hgb urine dipstick: NEGATIVE
Leukocytes, UA: NEGATIVE
Nitrite: POSITIVE — AB
Protein, ur: 30 mg/dL — AB
Specific Gravity, Urine: >1.03 — ABNORMAL HIGH (ref 1.005–1.030)
Urobilinogen, UA: 4 mg/dL — ABNORMAL HIGH (ref 0.0–1.0)
pH: 5.5 (ref 5.0–8.0)

## 2010-09-01 LAB — URINE MICROSCOPIC-ADD ON

## 2010-09-01 LAB — URIC ACID: Uric Acid, Serum: 8.2 mg/dL — ABNORMAL HIGH (ref 4.0–7.8)

## 2010-09-01 MED ORDER — AMLODIPINE BESYLATE 5 MG PO TABS
10.0000 mg | ORAL_TABLET | Freq: Every day | ORAL | Status: DC
  Administered 2010-09-02 – 2010-09-03 (×2): 10 mg via ORAL
  Filled 2010-09-01 (×2): qty 2

## 2010-09-01 MED ORDER — PANTOPRAZOLE SODIUM 40 MG PO TBEC
40.0000 mg | DELAYED_RELEASE_TABLET | Freq: Every day | ORAL | Status: DC
  Administered 2010-09-01 – 2010-09-02 (×2): 40 mg via ORAL
  Filled 2010-09-01 (×2): qty 1

## 2010-09-01 MED ORDER — METOPROLOL TARTRATE 50 MG PO TABS
100.0000 mg | ORAL_TABLET | Freq: Two times a day (BID) | ORAL | Status: DC
  Administered 2010-09-01 – 2010-09-03 (×4): 100 mg via ORAL
  Filled 2010-09-01 (×4): qty 2

## 2010-09-01 MED ORDER — ACETAMINOPHEN 500 MG PO TABS: 1000.0000 mg | ORAL_TABLET | Freq: Four times a day (QID) | ORAL | Status: DC | PRN

## 2010-09-01 MED ORDER — ZOLPIDEM TARTRATE 5 MG PO TABS: 5.0000 mg | ORAL_TABLET | Freq: Every evening | ORAL | Status: DC | PRN

## 2010-09-01 MED ORDER — ENOXAPARIN SODIUM 40 MG/0.4ML ~~LOC~~ SOLN
40.0000 mg | SUBCUTANEOUS | Status: DC
  Administered 2010-09-01 – 2010-09-02 (×2): 40 mg via SUBCUTANEOUS
  Filled 2010-09-01 (×2): qty 0.4

## 2010-09-01 MED ORDER — OXYCODONE HCL 5 MG PO TABS: 5.0000 mg | ORAL_TABLET | ORAL | Status: DC | PRN

## 2010-09-01 MED ORDER — INSULIN ASPART 100 UNIT/ML ~~LOC~~ SOLN
0.0000 [IU] | Freq: Three times a day (TID) | SUBCUTANEOUS | Status: DC
  Administered 2010-09-01: 4 [IU] via SUBCUTANEOUS
  Administered 2010-09-02: 7 [IU] via SUBCUTANEOUS
  Administered 2010-09-02 – 2010-09-03 (×3): 11 [IU] via SUBCUTANEOUS
  Filled 2010-09-01: qty 3

## 2010-09-01 MED ORDER — MORPHINE SULFATE 4 MG/ML IJ SOLN
4.0000 mg | Freq: Once | INTRAMUSCULAR | Status: AC
  Administered 2010-09-01: 4 mg via INTRAVENOUS
  Filled 2010-09-01: qty 1

## 2010-09-01 MED ORDER — SODIUM CHLORIDE 0.9 % IV BOLUS (SEPSIS): 500.0000 mL | Freq: Once | INTRAVENOUS | Status: DC

## 2010-09-01 MED ORDER — ONDANSETRON HCL 4 MG/2ML IJ SOLN
4.0000 mg | Freq: Once | INTRAMUSCULAR | Status: AC
  Administered 2010-09-01: 4 mg via INTRAVENOUS
  Filled 2010-09-01: qty 2

## 2010-09-01 MED ORDER — ONDANSETRON HCL 4 MG PO TABS: 4.0000 mg | ORAL_TABLET | Freq: Four times a day (QID) | ORAL | Status: DC | PRN

## 2010-09-01 MED ORDER — SIMVASTATIN 20 MG PO TABS
20.0000 mg | ORAL_TABLET | Freq: Every day | ORAL | Status: DC
  Administered 2010-09-01 – 2010-09-02 (×2): 20 mg via ORAL
  Filled 2010-09-01 (×2): qty 1

## 2010-09-01 MED ORDER — SODIUM CHLORIDE 0.9 % IJ SOLN
INTRAMUSCULAR | Status: AC
  Filled 2010-09-01: qty 10

## 2010-09-01 MED ORDER — METHYLPREDNISOLONE SODIUM SUCC 125 MG IJ SOLR
125.0000 mg | Freq: Three times a day (TID) | INTRAMUSCULAR | Status: DC
  Administered 2010-09-01 – 2010-09-02 (×2): 125 mg via INTRAVENOUS
  Filled 2010-09-01 (×2): qty 2

## 2010-09-01 MED ORDER — SODIUM CHLORIDE 0.9 % IV BOLUS (SEPSIS)
500.0000 mL | Freq: Once | INTRAVENOUS | Status: AC
  Administered 2010-09-01: 500 mL via INTRAVENOUS

## 2010-09-01 MED ORDER — COLCHICINE 0.6 MG PO TABS
0.6000 mg | ORAL_TABLET | Freq: Two times a day (BID) | ORAL | Status: DC
  Administered 2010-09-01 – 2010-09-03 (×4): 0.6 mg via ORAL
  Filled 2010-09-01 (×4): qty 1

## 2010-09-01 MED ORDER — DOXAZOSIN MESYLATE 8 MG PO TABS
8.0000 mg | ORAL_TABLET | Freq: Every day | ORAL | Status: DC
  Administered 2010-09-01 – 2010-09-02 (×2): 8 mg via ORAL
  Filled 2010-09-01 (×2): qty 1

## 2010-09-01 MED ORDER — METHYLPREDNISOLONE SODIUM SUCC 125 MG IJ SOLR: 125.0000 mg | Freq: Three times a day (TID) | INTRAMUSCULAR | Status: DC

## 2010-09-01 MED ORDER — SENNOSIDES-DOCUSATE SODIUM 8.6-50 MG PO TABS: 1.0000 | ORAL_TABLET | Freq: Every day | ORAL | Status: DC | PRN

## 2010-09-01 MED ORDER — ONDANSETRON HCL 4 MG/2ML IJ SOLN: 4.0000 mg | Freq: Four times a day (QID) | INTRAMUSCULAR | Status: DC | PRN

## 2010-09-01 MED ORDER — LISINOPRIL 10 MG PO TABS
20.0000 mg | ORAL_TABLET | Freq: Two times a day (BID) | ORAL | Status: DC
  Administered 2010-09-01 – 2010-09-03 (×4): 20 mg via ORAL
  Filled 2010-09-01 (×4): qty 2

## 2010-09-01 MED ORDER — DEXTROSE 5 % IV SOLN
1.0000 g | Freq: Once | INTRAVENOUS | Status: AC
  Administered 2010-09-01: 1 g via INTRAVENOUS
  Filled 2010-09-01: qty 1

## 2010-09-01 MED ORDER — METHYLPREDNISOLONE SODIUM SUCC 125 MG IJ SOLR
125.0000 mg | Freq: Once | INTRAMUSCULAR | Status: AC
  Administered 2010-09-01: 125 mg via INTRAVENOUS
  Filled 2010-09-01: qty 2

## 2010-09-01 NOTE — ED Notes (Signed)
Attempted to ambulate pt in room pt stood and was only able to bear weight to right leg. Pt states he felt like his left leg would give out and could not tolerate weight to left side. Pt did not ambulate.

## 2010-09-01 NOTE — ED Provider Notes (Signed)
History     CSN: [id number] Arrival date & time: 09/01/2010  7:57 AM  Chief Complaint  Patient presents with  . Gout   HPI Comments: Patient with hx of gout c/o increasing pain, swelling to both knees for 4-5 days.  Pain to left knee is greater than right.  Unable to bear weight due to pain. Also c/o "knots" to the posterior left knee and lateral right calf.  He denies recent injury, fever, chest pain or shortness of breath  Patient is a 40 y.o. male presenting with knee pain. The history is provided by the patient.  Knee Pain This is a recurrent problem. The current episode started in the past 7 days. The problem occurs constantly. The problem has been gradually worsening. Associated symptoms include arthralgias and joint swelling. Pertinent negatives include no abdominal pain, anorexia, chest pain, chills, fatigue, fever, headaches, nausea, neck pain, numbness, rash, sore throat, swollen glands, vomiting or weakness. The symptoms are aggravated by standing and walking. Treatments tried: his own medication. The treatment provided no relief.    Past Medical History  Diagnosis Date  . Knee pain     feet   . Gout   . Hypertension   . Diabetes mellitus   . Kidney disease   . Palpitation     Past Surgical History  Procedure Date  . Kidney stone surgery     post ureteral stent and status post lithotripsy (10-15 yrs Ago)    History reviewed. No pertinent family history.  History  Substance Use Topics  . Smoking status: Never Smoker   . Smokeless tobacco: Never Used  . Alcohol Use: No      Review of Systems  Constitutional: Positive for activity change. Negative for fever, chills, appetite change and fatigue.  HENT: Negative for sore throat, trouble swallowing, neck pain and neck stiffness.   Respiratory: Negative.   Cardiovascular: Negative.  Negative for chest pain.  Gastrointestinal: Negative for nausea, vomiting, abdominal pain and anorexia.  Genitourinary: Negative for  dysuria and hematuria.  Musculoskeletal: Positive for joint swelling, arthralgias and gait problem. Negative for back pain.  Skin: Negative.  Negative for rash.  Neurological: Negative for dizziness, weakness, numbness and headaches.  Hematological: Negative for adenopathy. Does not bruise/bleed easily.    Physical Exam  BP 121/74  Pulse 89  Temp(Src) 99.8 F (37.7 C) (Oral)  Resp 16  Ht 5\' 9"  (1.753 m)  Wt 315 lb (142.883 kg)  BMI 46.52 kg/m2  SpO2 98%  Physical Exam  Nursing note and vitals reviewed. Constitutional: He is oriented to person, place, and time. He appears well-developed and well-nourished. No distress.  HENT:  Head: Normocephalic and atraumatic.  Mouth/Throat: Oropharynx is clear and moist.  Neck: Normal range of motion. Neck supple. No thyromegaly present.  Cardiovascular: Normal rate, regular rhythm and normal heart sounds.   Pulmonary/Chest: Effort normal and breath sounds normal. No respiratory distress. He exhibits no tenderness.  Abdominal: Soft. There is no tenderness. There is no rebound and no guarding.  Musculoskeletal: He exhibits edema and tenderness.       Right knee: He exhibits swelling and effusion. He exhibits no ecchymosis and no erythema. tenderness found. No patellar tendon tenderness noted.       Left knee: He exhibits decreased range of motion, swelling and effusion. He exhibits no ecchymosis, no deformity and no erythema. tenderness found. No patellar tendon tenderness noted.  Lymphadenopathy:    He has no cervical adenopathy.  Neurological: He is alert and  oriented to person, place, and time. He has normal reflexes. He displays normal reflexes. No cranial nerve deficit. He exhibits normal muscle tone. Coordination normal.  Skin: Skin is warm. No rash noted. No erythema. No pallor.    ED Course  Procedures  MDM  1100  Patient is resting, states pain has improved somewhat.  Vitals remain stable.  I have reviewed his lab and imagining  results and discussed them with him and the EDP.  Pain, effusion and elevated uric acid level c/w acute gouty arthritis.  Clinical suspicion for septic arthritis is low.    1250  Patient tried to ambulate but only able to make two steps.  Antalgic gait.  Also has UTI, urine culture is pending  I will consult for admission  1340  Consulted Dr. Karilyn Cota.  Will admit  Isack Lavalley L. Trisha Mangle, [place] 09/01/10 1341

## 2010-09-01 NOTE — ED Notes (Signed)
Pt c/o swelling and pain to bilateral knees since Monday. Pt also c/o a knot behind his left knee. Hardened area noted. Pt able to move extremities but causes pain.

## 2010-09-01 NOTE — H&P (Signed)
Nicholas Turner MRN: [id number] DOB/AGE: October 04, 1970 40 y.o. Primary Care Physician:HELMAN,STEVEN, MD Admit date: 09/01/2010 Chief Complaint: Left knee pain and swelling. HPI: This  pleasant 40 year old African American gentleman gives a 3 to four-day history of severe left knee pain associated with inability to walk. He has been seen in the emergency room and despite analgesics he still is not able to walk. He has a history of gout and he has had a gout flare just like this one previously. Denies any trauma to the leg or the knee.   Past medical history: 1. Gout affecting the knee. 2. Hypertension. 3. Diabetes mellitus, diet controlled at the present time. 4. Obesity.      Family history: Noncontributory.  Social history: He has been married for 18 years and lives with his wife and 3 daughters. He does not smoke. He does not drink alcohol. He is currently unemployed.  Allergies:  Allergies  Allergen Reactions  . Clonidine Derivatives Other (See Comments)    Welps  . Oxycodone Nausea And Vomiting    Medications Prior to Admission  Medication Dose Route Frequency Provider Last Rate Last Dose  . cefTRIAXone (ROCEPHIN) 1 g in dextrose 5 % 50 mL IVPB  1 g Intravenous Once Tammy L. Triplett, PA   1 g at 09/01/10 1347  . methylPREDNISolone sodium succinate (SOLU-MEDROL) injection 125 mg  125 mg Intravenous Once Tammy L. Triplett, PA   125 mg at 09/01/10 1153  . morphine injection 4 mg  4 mg Intravenous Once Tammy L. Triplett, PA   4 mg at 09/01/10 0904  . morphine injection 4 mg  4 mg Intravenous Once Tammy L. Triplett, PA   4 mg at 09/01/10 1108  . ondansetron (ZOFRAN) injection 4 mg  4 mg Intravenous Once Tammy L. Triplett, PA   4 mg at 09/01/10 0905  . sodium chloride 0.9 % bolus 500 mL  500 mL Intravenous Once Tammy L. Triplett, PA   500 mL at 09/01/10 0901  . sodium chloride 0.9 % bolus 500 mL  500 mL Intravenous Once Tammy L. Triplett, PA       Medications Prior to Admission    Medication Sig Dispense Refill  . amLODipine (NORVASC) 10 MG tablet Take 10 mg by mouth daily.        . colchicine 0.6 MG tablet Take 0.6 mg by mouth 2 (two) times daily.        Marland Kitchen doxazosin (CARDURA) 8 MG tablet Take 8 mg by mouth at bedtime.        . hydrochlorothiazide 25 MG tablet Take 25 mg by mouth daily.        Marland Kitchen lisinopril (PRINIVIL,ZESTRIL) 20 MG tablet Take 20 mg by mouth 2 (two) times daily.        . metoprolol (LOPRESSOR) 100 MG tablet Take 100 mg by mouth 2 (two) times daily.        . pravastatin (PRAVACHOL) 20 MG tablet Take 20 mg by mouth daily.        Marland Kitchen allopurinol (ZYLOPRIM) 300 MG tablet Take 300 mg by mouth 2 (two) times daily.        Marland Kitchen glimepiride (AMARYL) 4 MG tablet Take 4 mg by mouth daily before breakfast.             WUJ:WJXBJ from the symptoms mentioned above,there are no other symptoms referable to all systems reviewed.  Physical Exam: Blood pressure 100/55, pulse 76, temperature 98.5 F (36.9 C), temperature source Oral, resp. rate 18,  height 5\' 9"  (1.753 m), weight 142.883 kg (315 lb), SpO2 93.00%. He looks systemically well. He is obese. He is not in any acute distress. Cardiovascular: Heart sounds are present and normal without murmurs. Respiratory: Lung fields are clear. Abdomen: Soft, nontender, no masses, no hepatosplenomegaly. Neurological: Alert and orientated, no focal neurological signs. Musculoskeletal: Swelling of the left knee with associated joint effusion. There is also slight swelling of the right knee. He is unable to flex his left knee. Skin: No abnormalities.  Results for orders placed during the hospital encounter of 09/01/10 (from the past 48 hour(s))  CBC     Status: Abnormal   Collection Time   09/01/10  8:50 AM      Component Value Range Comment   WBC 10.7 (*) 4.0 - 10.5 (K/uL)    RBC 4.17 (*) 4.22 - 5.81 (MIL/uL)    Hemoglobin 12.3 (*) 13.0 - 17.0 (g/dL)    HCT 16.1 (*) 09.6 - 52.0 (%)    MCV 90.2  78.0 - 100.0 (fL)    MCH 29.5   26.0 - 34.0 (pg)    MCHC 32.7  30.0 - 36.0 (g/dL)    RDW 04.5  40.9 - 81.1 (%)    Platelets 218  150 - 400 (K/uL)   DIFFERENTIAL     Status: Abnormal   Collection Time   09/01/10  8:50 AM      Component Value Range Comment   Neutrophils Relative 71  43 - 77 (%)    Neutro Abs 7.6  1.7 - 7.7 (K/uL)    Lymphocytes Relative 16  12 - 46 (%)    Lymphs Abs 1.7  0.7 - 4.0 (K/uL)    Monocytes Relative 13 (*) 3 - 12 (%)    Monocytes Absolute 1.4 (*) 0.1 - 1.0 (K/uL)    Eosinophils Relative 0  0 - 5 (%)    Eosinophils Absolute 0.0  0.0 - 0.7 (K/uL)    Basophils Relative 0  0 - 1 (%)    Basophils Absolute 0.0  0.0 - 0.1 (K/uL)   BASIC METABOLIC PANEL     Status: Abnormal   Collection Time   09/01/10  8:50 AM      Component Value Range Comment   Sodium 135  135 - 145 (mEq/L)    Potassium 3.3 (*) 3.5 - 5.1 (mEq/L)    Chloride 98  96 - 112 (mEq/L)    CO2 27  19 - 32 (mEq/L)    Glucose, Bld 172 (*) 70 - 99 (mg/dL)    BUN 15  6 - 23 (mg/dL)    Creatinine, Ser 9.14  0.50 - 1.35 (mg/dL)    Calcium 9.2  8.4 - 10.5 (mg/dL)    GFR calc non Af Amer >60  >60 (mL/min)    GFR calc Af Amer >60  >60 (mL/min)   URIC ACID     Status: Abnormal   Collection Time   09/01/10  8:50 AM      Component Value Range Comment   Uric Acid, Serum 8.2 (*) 4.0 - 7.8 (mg/dL)   URINALYSIS, ROUTINE W REFLEX MICROSCOPIC     Status: Abnormal   Collection Time   09/01/10 10:22 AM      Component Value Range Comment   Color, Urine BROWN (*) YELLOW  BIOCHEMICALS MAY BE AFFECTED BY COLOR   Appearance CLEAR  CLEAR     Specific Gravity, Urine >1.030 (*) 1.005 - 1.030     pH 5.5  5.0 -  8.0     Glucose, UA 100 (*) NEGATIVE (mg/dL)    Hgb urine dipstick NEGATIVE  NEGATIVE     Bilirubin Urine MODERATE (*) NEGATIVE     Ketones, ur TRACE (*) NEGATIVE (mg/dL)    Protein, ur 30 (*) NEGATIVE (mg/dL)    Urobilinogen, UA 4.0 (*) 0.0 - 1.0 (mg/dL)    Nitrite POSITIVE (*) NEGATIVE     Leukocytes, UA NEGATIVE  NEGATIVE    URINE  MICROSCOPIC-ADD ON     Status: Abnormal   Collection Time   09/01/10 10:22 AM      Component Value Range Comment   Squamous Epithelial / LPF FEW (*) RARE     WBC, UA 3-6  <3 (WBC/hpf)    RBC / HPF 0-2  <3 (RBC/hpf)    Bacteria, UA MANY (*) RARE     Urine-Other MUCOUS PRESENT      No results found for this or any previous visit (from the past 240 hour(s)).  Dg Knee Complete 4 Views Left  09/01/2010  *RADIOLOGY REPORT*  Clinical Data: Pain and swelling, knot in the posterior left knee, history of gout  LEFT KNEE - COMPLETE 4+ VIEW  Comparison: None.  Findings: No fracture or dislocation.  Mild tricompartmental degenerative change with joint space loss, subchondral sclerosis and osteophytosis.  Moderate sized joint effusion.  No evidence of chondrocalcinosis.  Regional soft tissues are normal with special attention paid to the posterior knee.  IMPRESSION: 1.  Mild tricompartmental degenerative change.  2.  Moderate size joint effusion.  Original Report Authenticated By: Waynard Reeds, M.D.   Impression: 1. Acute left knee gout attack. 2. Hypertension. 3. Diabetes mellitus, diet controlled. 4. Obesity.     Plan: 1. Admit. 2. Intravenous steroids. 3. Continue all medications for gout that he was taking at home. Further recommendations will depend on patient's hospital progress.      Jamontae Thwaites C 09/01/2010, 2:10 PM

## 2010-09-01 NOTE — ED Notes (Signed)
C/o increased pain/swelling to bil knees x 5 days.  States he thinks it's his gout.  Also c/o knots to back of left knee and back of right calf.

## 2010-09-01 NOTE — ED Notes (Signed)
Family at bedside. 

## 2010-09-02 ENCOUNTER — Inpatient Hospital Stay (HOSPITAL_COMMUNITY): Payer: Medicaid Other

## 2010-09-02 LAB — COMPREHENSIVE METABOLIC PANEL
ALT: 23 U/L (ref 0–53)
AST: 12 U/L (ref 0–37)
Albumin: 3.1 g/dL — ABNORMAL LOW (ref 3.5–5.2)
Alkaline Phosphatase: 102 U/L (ref 39–117)
BUN: 23 mg/dL (ref 6–23)
CO2: 25 mEq/L (ref 19–32)
Calcium: 9.4 mg/dL (ref 8.4–10.5)
Chloride: 99 mEq/L (ref 96–112)
Creatinine, Ser: 0.92 mg/dL (ref 0.50–1.35)
GFR calc Af Amer: >60 mL/min (ref >60)
GFR calc non Af Amer: >60 mL/min (ref >60)
Glucose, Bld: 231 mg/dL — ABNORMAL HIGH (ref 70–99)
Potassium: 4 mEq/L (ref 3.5–5.1)
Sodium: 138 mEq/L (ref 135–145)
Total Bilirubin: 0.3 mg/dL (ref 0.3–1.2)
Total Protein: 8.9 g/dL — ABNORMAL HIGH (ref 6.0–8.3)

## 2010-09-02 LAB — CBC
HCT: 38.2 % — ABNORMAL LOW (ref 39.0–52.0)
Hemoglobin: 12.4 g/dL — ABNORMAL LOW (ref 13.0–17.0)
MCH: 29.5 pg (ref 26.0–34.0)
MCHC: 32.5 g/dL (ref 30.0–36.0)
MCV: 90.7 fL (ref 78.0–100.0)
Platelets: 245 10*3/uL (ref 150–400)
RBC: 4.21 MIL/uL — ABNORMAL LOW (ref 4.22–5.81)
RDW: 12.7 % (ref 11.5–15.5)
WBC: 15.4 10*3/uL — ABNORMAL HIGH (ref 4.0–10.5)

## 2010-09-02 LAB — URINE CULTURE
Colony Count: 3000
Culture  Setup Time: 201208102300

## 2010-09-02 LAB — GLUCOSE, CAPILLARY
Glucose-Capillary: 203 mg/dL — ABNORMAL HIGH (ref 70–99)
Glucose-Capillary: 257 mg/dL — ABNORMAL HIGH (ref 70–99)
Glucose-Capillary: 265 mg/dL — ABNORMAL HIGH (ref 70–99)
Glucose-Capillary: 312 mg/dL — ABNORMAL HIGH (ref 70–99)

## 2010-09-02 LAB — HEMOGLOBIN A1C
Hgb A1c MFr Bld: 6.2 % — ABNORMAL HIGH (ref <5.7)
Mean Plasma Glucose: 131 mg/dL — ABNORMAL HIGH (ref <117)

## 2010-09-02 MED ORDER — SODIUM CHLORIDE 0.9 % IJ SOLN
INTRAMUSCULAR | Status: AC
  Administered 2010-09-02: 3 mL
  Filled 2010-09-02: qty 3

## 2010-09-02 MED ORDER — HYDROCODONE-ACETAMINOPHEN 5-325 MG PO TABS
1.5000 | ORAL_TABLET | Freq: Four times a day (QID) | ORAL | Status: DC | PRN
  Administered 2010-09-02 (×2): 1.5 via ORAL
  Filled 2010-09-02 (×2): qty 2

## 2010-09-02 MED ORDER — HYDROCODONE-ACETAMINOPHEN 7.5-325 MG PO TABS: 1.0000 | ORAL_TABLET | Freq: Four times a day (QID) | ORAL | Status: DC | PRN

## 2010-09-02 MED ORDER — SODIUM CHLORIDE 0.9 % IJ SOLN
INTRAMUSCULAR | Status: AC
  Administered 2010-09-02: 05:00:00
  Filled 2010-09-02: qty 10

## 2010-09-02 MED ORDER — METHYLPREDNISOLONE SODIUM SUCC 125 MG IJ SOLR
125.0000 mg | Freq: Two times a day (BID) | INTRAMUSCULAR | Status: DC
  Administered 2010-09-02 – 2010-09-03 (×2): 125 mg via INTRAVENOUS
  Filled 2010-09-02 (×2): qty 2

## 2010-09-02 NOTE — Progress Notes (Signed)
Subjective: This man's L knee has improved but not completely better.Walking with walker.Complains of swelling L lower leg.           Physical Exam: Blood pressure 156/95, pulse 69, temperature 97.6 F (36.4 C), temperature source Oral, resp. rate 20, height 5\' 9"  (1.753 m), weight 142.9 kg (315 lb 0.6 oz), SpO2 95.00%. L knee less swollen.?Swelling L calf.   Investigations: Results for orders placed during the hospital encounter of 09/01/10 (from the past 48 hour(s))  CBC     Status: Abnormal   Collection Time   09/01/10  8:50 AM      Component Value Range Comment   WBC 10.7 (*) 4.0 - 10.5 (K/uL)    RBC 4.17 (*) 4.22 - 5.81 (MIL/uL)    Hemoglobin 12.3 (*) 13.0 - 17.0 (g/dL)    HCT 91.4 (*) 78.2 - 52.0 (%)    MCV 90.2  78.0 - 100.0 (fL)    MCH 29.5  26.0 - 34.0 (pg)    MCHC 32.7  30.0 - 36.0 (g/dL)    RDW 95.6  21.3 - 08.6 (%)    Platelets 218  150 - 400 (K/uL)   DIFFERENTIAL     Status: Abnormal   Collection Time   09/01/10  8:50 AM      Component Value Range Comment   Neutrophils Relative 71  43 - 77 (%)    Neutro Abs 7.6  1.7 - 7.7 (K/uL)    Lymphocytes Relative 16  12 - 46 (%)    Lymphs Abs 1.7  0.7 - 4.0 (K/uL)    Monocytes Relative 13 (*) 3 - 12 (%)    Monocytes Absolute 1.4 (*) 0.1 - 1.0 (K/uL)    Eosinophils Relative 0  0 - 5 (%)    Eosinophils Absolute 0.0  0.0 - 0.7 (K/uL)    Basophils Relative 0  0 - 1 (%)    Basophils Absolute 0.0  0.0 - 0.1 (K/uL)   BASIC METABOLIC PANEL     Status: Abnormal   Collection Time   09/01/10  8:50 AM      Component Value Range Comment   Sodium 135  135 - 145 (mEq/L)    Potassium 3.3 (*) 3.5 - 5.1 (mEq/L)    Chloride 98  96 - 112 (mEq/L)    CO2 27  19 - 32 (mEq/L)    Glucose, Bld 172 (*) 70 - 99 (mg/dL)    BUN 15  6 - 23 (mg/dL)    Creatinine, Ser 5.78  0.50 - 1.35 (mg/dL)    Calcium 9.2  8.4 - 10.5 (mg/dL)    GFR calc non Af Amer >60  >60 (mL/min)    GFR calc Af Amer >60  >60 (mL/min)   URIC ACID     Status: Abnormal     Collection Time   09/01/10  8:50 AM      Component Value Range Comment   Uric Acid, Serum 8.2 (*) 4.0 - 7.8 (mg/dL)   HEMOGLOBIN I6N     Status: Abnormal   Collection Time   09/01/10  8:50 AM      Component Value Range Comment   Hemoglobin A1C 6.2 (*) <5.7 (%)    Mean Plasma Glucose 131 (*) <117 (mg/dL)   URINALYSIS, ROUTINE W REFLEX MICROSCOPIC     Status: Abnormal   Collection Time   09/01/10 10:22 AM      Component Value Range Comment   Color, Urine BROWN (*) YELLOW  BIOCHEMICALS  MAY BE AFFECTED BY COLOR   Appearance CLEAR  CLEAR     Specific Gravity, Urine >1.030 (*) 1.005 - 1.030     pH 5.5  5.0 - 8.0     Glucose, UA 100 (*) NEGATIVE (mg/dL)    Hgb urine dipstick NEGATIVE  NEGATIVE     Bilirubin Urine MODERATE (*) NEGATIVE     Ketones, ur TRACE (*) NEGATIVE (mg/dL)    Protein, ur 30 (*) NEGATIVE (mg/dL)    Urobilinogen, UA 4.0 (*) 0.0 - 1.0 (mg/dL)    Nitrite POSITIVE (*) NEGATIVE     Leukocytes, UA NEGATIVE  NEGATIVE    URINE MICROSCOPIC-ADD ON     Status: Abnormal   Collection Time   09/01/10 10:22 AM      Component Value Range Comment   Squamous Epithelial / LPF FEW (*) RARE     WBC, UA 3-6  <3 (WBC/hpf)    RBC / HPF 0-2  <3 (RBC/hpf)    Bacteria, UA MANY (*) RARE     Urine-Other MUCOUS PRESENT     GLUCOSE, CAPILLARY     Status: Abnormal   Collection Time   09/01/10  4:37 PM      Component Value Range Comment   Glucose-Capillary 174 (*) 70 - 99 (mg/dL)   GLUCOSE, CAPILLARY     Status: Abnormal   Collection Time   09/01/10  9:32 PM      Component Value Range Comment   Glucose-Capillary 303 (*) 70 - 99 (mg/dL)    Comment 1 Notify RN     COMPREHENSIVE METABOLIC PANEL     Status: Abnormal   Collection Time   09/02/10  5:49 AM      Component Value Range Comment   Sodium 138  135 - 145 (mEq/L)    Potassium 4.0  3.5 - 5.1 (mEq/L)    Chloride 99  96 - 112 (mEq/L)    CO2 25  19 - 32 (mEq/L)    Glucose, Bld 231 (*) 70 - 99 (mg/dL)    BUN 23  6 - 23 (mg/dL)     Creatinine, Ser 1.61  0.50 - 1.35 (mg/dL)    Calcium 9.4  8.4 - 10.5 (mg/dL)    Total Protein 8.9 (*) 6.0 - 8.3 (g/dL)    Albumin 3.1 (*) 3.5 - 5.2 (g/dL)    AST 12  0 - 37 (U/L)    ALT 23  0 - 53 (U/L)    Alkaline Phosphatase 102  39 - 117 (U/L)    Total Bilirubin 0.3  0.3 - 1.2 (mg/dL)    GFR calc non Af Amer >60  >60 (mL/min)    GFR calc Af Amer >60  >60 (mL/min)   CBC     Status: Abnormal   Collection Time   09/02/10  5:49 AM      Component Value Range Comment   WBC 15.4 (*) 4.0 - 10.5 (K/uL)    RBC 4.21 (*) 4.22 - 5.81 (MIL/uL)    Hemoglobin 12.4 (*) 13.0 - 17.0 (g/dL)    HCT 09.6 (*) 04.5 - 52.0 (%)    MCV 90.7  78.0 - 100.0 (fL)    MCH 29.5  26.0 - 34.0 (pg)    MCHC 32.5  30.0 - 36.0 (g/dL)    RDW 40.9  81.1 - 91.4 (%)    Platelets 245  150 - 400 (K/uL)   GLUCOSE, CAPILLARY     Status: Abnormal   Collection Time   09/02/10  7:44 AM      Component Value Range Comment   Glucose-Capillary 203 (*) 70 - 99 (mg/dL)    No results found for this or any previous visit (from the past 240 hour(s)).  Dg Knee Complete 4 Views Left  09/01/2010  *RADIOLOGY REPORT*  Clinical Data: Pain and swelling, knot in the posterior left knee, history of gout  LEFT KNEE - COMPLETE 4+ VIEW  Comparison: None.  Findings: No fracture or dislocation.  Mild tricompartmental degenerative change with joint space loss, subchondral sclerosis and osteophytosis.  Moderate sized joint effusion.  No evidence of chondrocalcinosis.  Regional soft tissues are normal with special attention paid to the posterior knee.  IMPRESSION: 1.  Mild tricompartmental degenerative change.  2.  Moderate size joint effusion.  Original Report Authenticated By: Waynard Reeds, M.D.      Medications: I have reviewed the patient's current medications.  Impression: 1.Acute gout L Knee,improving. 2.Swelling L calf. 3.Elevated BP. 4.DM 5.Obesity     Plan: 1.Reduce iv steroids. 2.L LEG VENOUS DOPPLER. 3.Monitor HTN.      LOS: 1 day   Ayaana Biondo C 09/02/2010, 9:01 AM

## 2010-09-03 LAB — CBC
HCT: 36.6 % — ABNORMAL LOW (ref 39.0–52.0)
Hemoglobin: 11.6 g/dL — ABNORMAL LOW (ref 13.0–17.0)
MCH: 29 pg (ref 26.0–34.0)
MCHC: 31.7 g/dL (ref 30.0–36.0)
MCV: 91.5 fL (ref 78.0–100.0)
Platelets: 322 K/uL (ref 150–400)
RBC: 4 MIL/uL — ABNORMAL LOW (ref 4.22–5.81)
RDW: 12.8 % (ref 11.5–15.5)
WBC: 18.1 K/uL — ABNORMAL HIGH (ref 4.0–10.5)

## 2010-09-03 LAB — BASIC METABOLIC PANEL
BUN: 28 mg/dL — ABNORMAL HIGH (ref 6–23)
CO2: 24 mEq/L (ref 19–32)
Calcium: 8.9 mg/dL (ref 8.4–10.5)
Chloride: 98 mEq/L (ref 96–112)
Creatinine, Ser: 0.81 mg/dL (ref 0.50–1.35)
GFR calc Af Amer: >60 mL/min (ref >60)
GFR calc non Af Amer: >60 mL/min (ref >60)
Glucose, Bld: 326 mg/dL — ABNORMAL HIGH (ref 70–99)
Potassium: 4.1 mEq/L (ref 3.5–5.1)
Sodium: 136 mEq/L (ref 135–145)

## 2010-09-03 LAB — GLUCOSE, CAPILLARY: Glucose-Capillary: 254 mg/dL — ABNORMAL HIGH (ref 70–99)

## 2010-09-03 MED ORDER — METFORMIN HCL 500 MG PO TABS: 500.0000 mg | ORAL_TABLET | Freq: Two times a day (BID) | ORAL | Status: DC

## 2010-09-03 MED ORDER — HYDROCODONE-ACETAMINOPHEN 5-325 MG PO TABS: 1.5000 | ORAL_TABLET | Freq: Four times a day (QID) | ORAL | Status: AC | PRN

## 2010-09-03 MED ORDER — PREDNISONE 20 MG PO TABS: ORAL_TABLET | ORAL | Status: DC

## 2010-09-03 NOTE — Progress Notes (Signed)
Patient received discharge instructions along with follow up appointments. Patient verbalized understanding of all instructions. Patient was escorted by staff via wheelchair to vehicle. Patient discharged to home in stable condition. 

## 2010-09-03 NOTE — Discharge Summary (Signed)
Physician Discharge Summary  Patient ID: Nicholas Turner MRN: [id number] DOB/AGE: 22-Apr-1970 40 y.o.  Admit date: 09/01/2010 Discharge date: 09/03/2010    Discharge Diagnoses:  1. Acute left knee gout, improved. 2. Left Baker's cyst. 3. Type 2 diabetes mellitus. 4. Obesity.   Current Discharge Medication List    START taking these medications   Details  HYDROcodone-acetaminophen (NORCO) 5-325 MG per tablet Take 1.5 tablets by mouth every 6 (six) hours as needed. Qty: 30 tablet, Refills: 0    metFORMIN (GLUCOPHAGE) 500 MG tablet Take 1 tablet (500 mg total) by mouth 2 (two) times daily with a meal. Qty: 60 tablet, Refills: 0    predniSONE (DELTASONE) 20 MG tablet Take 2 tabs daily for 3 days,then 1 tab daily for 3 days,then 1/2 tab daily for 3 days,then STOP Qty: 15 tablet, Refills: 0      CONTINUE these medications which have NOT CHANGED   Details  acetaminophen (TYLENOL) 500 MG tablet Take 1,000 mg by mouth every 6 (six) hours as needed. Pain     amLODipine (NORVASC) 10 MG tablet Take 10 mg by mouth daily.      colchicine 0.6 MG tablet Take 0.6 mg by mouth 2 (two) times daily.      doxazosin (CARDURA) 8 MG tablet Take 8 mg by mouth at bedtime.      lisinopril (PRINIVIL,ZESTRIL) 20 MG tablet Take 20 mg by mouth 2 (two) times daily.      metoprolol (LOPRESSOR) 100 MG tablet Take 100 mg by mouth 2 (two) times daily.      pravastatin (PRAVACHOL) 20 MG tablet Take 20 mg by mouth daily.        STOP taking these medications     hydrochlorothiazide 25 MG tablet      allopurinol (ZYLOPRIM) 300 MG tablet      glimepiride (AMARYL) 4 MG tablet         Discharged Condition: Stable and improved.    Consults: None.  Significant Diagnostic Studies: US Venous Img Lower Unilateral Left  09/02/2010  *RADIOLOGY REPORT*  Clinical Data: Pain and swelling, posterior popliteal palpable abnormality  LEFT LOWER EXTREMITY VENOUS DOPPLER ULTRASOUND  Technique: Gray-scale  sonography with compression, as well as color and duplex ultrasound, were performed to evaluate the deep venous system from the level of the common femoral vein through the popliteal and proximal calf veins.  Comparison: 09/06/2004  Findings:  Normal compressibility of  the common femoral, superficial femoral, and popliteal veins, as well as the proximal calf veins.  No filling defects to suggest DVT on grayscale or color Doppler imaging.  Doppler waveforms show normal direction of venous flow, normal respiratory phasicity and response to augmentation. There is an elongated 19 x 51 x 84 mm collection in the posterior popliteal fossa.  IMPRESSION: 1. No evidence of  lower extremity deep vein thrombosis. 2.  Baker's cyst.  Original Report Authenticated By: Osa Craver, M.D.   Dg Knee Complete 4 Views Left  09/01/2010  *RADIOLOGY REPORT*  Clinical Data: Pain and swelling, knot in the posterior left knee, history of gout  LEFT KNEE - COMPLETE 4+ VIEW  Comparison: None.  Findings: No fracture or dislocation.  Mild tricompartmental degenerative change with joint space loss, subchondral sclerosis and osteophytosis.  Moderate sized joint effusion.  No evidence of chondrocalcinosis.  Regional soft tissues are normal with special attention paid to the posterior knee.  IMPRESSION: 1.  Mild tricompartmental degenerative change.  2.  Moderate size joint effusion.  Original Report Authenticated By: Waynard Reeds, M.D.    Lab Results: Results for orders placed during the hospital encounter of 09/01/10 (from the past 48 hour(s))  CBC     Status: Abnormal   Collection Time   09/01/10  8:50 AM      Component Value Range Comment   WBC 10.7 (*) 4.0 - 10.5 (K/uL)    RBC 4.17 (*) 4.22 - 5.81 (MIL/uL)    Hemoglobin 12.3 (*) 13.0 - 17.0 (g/dL)    HCT 69.6 (*) 29.5 - 52.0 (%)    MCV 90.2  78.0 - 100.0 (fL)    MCH 29.5  26.0 - 34.0 (pg)    MCHC 32.7  30.0 - 36.0 (g/dL)    RDW 28.4  13.2 - 44.0 (%)    Platelets  218  150 - 400 (K/uL)   DIFFERENTIAL     Status: Abnormal   Collection Time   09/01/10  8:50 AM      Component Value Range Comment   Neutrophils Relative 71  43 - 77 (%)    Neutro Abs 7.6  1.7 - 7.7 (K/uL)    Lymphocytes Relative 16  12 - 46 (%)    Lymphs Abs 1.7  0.7 - 4.0 (K/uL)    Monocytes Relative 13 (*) 3 - 12 (%)    Monocytes Absolute 1.4 (*) 0.1 - 1.0 (K/uL)    Eosinophils Relative 0  0 - 5 (%)    Eosinophils Absolute 0.0  0.0 - 0.7 (K/uL)    Basophils Relative 0  0 - 1 (%)    Basophils Absolute 0.0  0.0 - 0.1 (K/uL)   BASIC METABOLIC PANEL     Status: Abnormal   Collection Time   09/01/10  8:50 AM      Component Value Range Comment   Sodium 135  135 - 145 (mEq/L)    Potassium 3.3 (*) 3.5 - 5.1 (mEq/L)    Chloride 98  96 - 112 (mEq/L)    CO2 27  19 - 32 (mEq/L)    Glucose, Bld 172 (*) 70 - 99 (mg/dL)    BUN 15  6 - 23 (mg/dL)    Creatinine, Ser 1.02  0.50 - 1.35 (mg/dL)    Calcium 9.2  8.4 - 10.5 (mg/dL)    GFR calc non Af Amer >60  >60 (mL/min)    GFR calc Af Amer >60  >60 (mL/min)   URIC ACID     Status: Abnormal   Collection Time   09/01/10  8:50 AM      Component Value Range Comment   Uric Acid, Serum 8.2 (*) 4.0 - 7.8 (mg/dL)   HEMOGLOBIN V2Z     Status: Abnormal   Collection Time   09/01/10  8:50 AM      Component Value Range Comment   Hemoglobin A1C 6.2 (*) <5.7 (%)    Mean Plasma Glucose 131 (*) <117 (mg/dL)   URINALYSIS, ROUTINE W REFLEX MICROSCOPIC     Status: Abnormal   Collection Time   09/01/10 10:22 AM      Component Value Range Comment   Color, Urine BROWN (*) YELLOW  BIOCHEMICALS MAY BE AFFECTED BY COLOR   Appearance CLEAR  CLEAR     Specific Gravity, Urine >1.030 (*) 1.005 - 1.030     pH 5.5  5.0 - 8.0     Glucose, UA 100 (*) NEGATIVE (mg/dL)    Hgb urine dipstick NEGATIVE  NEGATIVE  Bilirubin Urine MODERATE (*) NEGATIVE     Ketones, ur TRACE (*) NEGATIVE (mg/dL)    Protein, ur 30 (*) NEGATIVE (mg/dL)    Urobilinogen, UA 4.0 (*) 0.0 - 1.0  (mg/dL)    Nitrite POSITIVE (*) NEGATIVE     Leukocytes, UA NEGATIVE  NEGATIVE    URINE MICROSCOPIC-ADD ON     Status: Abnormal   Collection Time   09/01/10 10:22 AM      Component Value Range Comment   Squamous Epithelial / LPF FEW (*) RARE     WBC, UA 3-6  <3 (WBC/hpf)    RBC / HPF 0-2  <3 (RBC/hpf)    Bacteria, UA MANY (*) RARE     Urine-Other MUCOUS PRESENT     URINE CULTURE     Status: Normal   Collection Time   09/01/10 11:52 AM      Component Value Range Comment   Specimen Description URINE, CLEAN CATCH      Special Requests NONE      Setup Time [id number]      Colony Count 3,000 COLONIES/ML      Culture INSIGNIFICANT GROWTH      Report Status 09/02/2010 FINAL     GLUCOSE, CAPILLARY     Status: Abnormal   Collection Time   09/01/10  4:37 PM      Component Value Range Comment   Glucose-Capillary 174 (*) 70 - 99 (mg/dL)   GLUCOSE, CAPILLARY     Status: Abnormal   Collection Time   09/01/10  9:32 PM      Component Value Range Comment   Glucose-Capillary 303 (*) 70 - 99 (mg/dL)    Comment 1 Notify RN     COMPREHENSIVE METABOLIC PANEL     Status: Abnormal   Collection Time   09/02/10  5:49 AM      Component Value Range Comment   Sodium 138  135 - 145 (mEq/L)    Potassium 4.0  3.5 - 5.1 (mEq/L)    Chloride 99  96 - 112 (mEq/L)    CO2 25  19 - 32 (mEq/L)    Glucose, Bld 231 (*) 70 - 99 (mg/dL)    BUN 23  6 - 23 (mg/dL)    Creatinine, Ser 4.13  0.50 - 1.35 (mg/dL)    Calcium 9.4  8.4 - 10.5 (mg/dL)    Total Protein 8.9 (*) 6.0 - 8.3 (g/dL)    Albumin 3.1 (*) 3.5 - 5.2 (g/dL)    AST 12  0 - 37 (U/L)    ALT 23  0 - 53 (U/L)    Alkaline Phosphatase 102  39 - 117 (U/L)    Total Bilirubin 0.3  0.3 - 1.2 (mg/dL)    GFR calc non Af Amer >60  >60 (mL/min)    GFR calc Af Amer >60  >60 (mL/min)   CBC     Status: Abnormal   Collection Time   09/02/10  5:49 AM      Component Value Range Comment   WBC 15.4 (*) 4.0 - 10.5 (K/uL)    RBC 4.21 (*) 4.22 - 5.81 (MIL/uL)     Hemoglobin 12.4 (*) 13.0 - 17.0 (g/dL)    HCT 24.4 (*) 01.0 - 52.0 (%)    MCV 90.7  78.0 - 100.0 (fL)    MCH 29.5  26.0 - 34.0 (pg)    MCHC 32.5  30.0 - 36.0 (g/dL)    RDW 27.2  53.6 - 64.4 (%)  Platelets 245  150 - 400 (K/uL)   GLUCOSE, CAPILLARY     Status: Abnormal   Collection Time   09/02/10  7:44 AM      Component Value Range Comment   Glucose-Capillary 203 (*) 70 - 99 (mg/dL)   GLUCOSE, CAPILLARY     Status: Abnormal   Collection Time   09/02/10 11:16 AM      Component Value Range Comment   Glucose-Capillary 257 (*) 70 - 99 (mg/dL)   GLUCOSE, CAPILLARY     Status: Abnormal   Collection Time   09/02/10  4:16 PM      Component Value Range Comment   Glucose-Capillary 265 (*) 70 - 99 (mg/dL)   GLUCOSE, CAPILLARY     Status: Abnormal   Collection Time   09/02/10  9:19 PM      Component Value Range Comment   Glucose-Capillary 312 (*) 70 - 99 (mg/dL)    Comment 1 Notify RN     CBC     Status: Abnormal   Collection Time   09/03/10  4:44 AM      Component Value Range Comment   WBC 18.1 (*) 4.0 - 10.5 (K/uL)    RBC 4.00 (*) 4.22 - 5.81 (MIL/uL)    Hemoglobin 11.6 (*) 13.0 - 17.0 (g/dL)    HCT 16.1 (*) 09.6 - 52.0 (%)    MCV 91.5  78.0 - 100.0 (fL)    MCH 29.0  26.0 - 34.0 (pg)    MCHC 31.7  30.0 - 36.0 (g/dL)    RDW 04.5  40.9 - 81.1 (%)    Platelets 322  150 - 400 (K/uL) DELTA CHECK NOTED  BASIC METABOLIC PANEL     Status: Abnormal   Collection Time   09/03/10  4:44 AM      Component Value Range Comment   Sodium 136  135 - 145 (mEq/L)    Potassium 4.1  3.5 - 5.1 (mEq/L)    Chloride 98  96 - 112 (mEq/L)    CO2 24  19 - 32 (mEq/L)    Glucose, Bld 326 (*) 70 - 99 (mg/dL)    BUN 28 (*) 6 - 23 (mg/dL)    Creatinine, Ser 9.14  0.50 - 1.35 (mg/dL)    Calcium 8.9  8.4 - 10.5 (mg/dL)    GFR calc non Af Amer >60  >60 (mL/min)    GFR calc Af Amer >60  >60 (mL/min)   GLUCOSE, CAPILLARY     Status: Abnormal   Collection Time   09/03/10  7:44 AM      Component Value Range Comment    Glucose-Capillary 254 (*) 70 - 99 (mg/dL)    Recent Results (from the past 240 hour(s))  URINE CULTURE     Status: Normal   Collection Time   09/01/10 11:52 AM      Component Value Range Status Comment   Specimen Description URINE, CLEAN CATCH   Final    Special Requests NONE   Final    Setup Time [id number]   Final    Colony Count 3,000 COLONIES/ML   Final    Culture INSIGNIFICANT GROWTH   Final    Report Status 09/02/2010 FINAL   Final      Hospital Course: This very pleasant 40 year old man was admitted with severe left knee pain which stopped him from walking. The left knee became swollen and painful over the last 3-4 days prior to admission. Also, his right knee, to a lesser  degree became swollen and painful. He has a history of gout and this is one of several gout attacks. He was admitted to the hospital and started on intravenous steroids in addition to his antigout medicine that he was taking at home. This achieved a good improvement over the next 24-36 hours. He has been diagnosed previously as a borderline diabetic but it was clear that he is diabetic. His hemoglobin A1c is 6.2% and his blood glucoses have been over 150 every time. Some of this could be accounted for by steroids but I think he does have underlying diabetes. He was put on a sliding scale for the sugars to control them. Today he feels much improved and has been able to walk with minimal pain. The swelling in both his knees have improved although still present especially in the left knee. Also, during his hospitalization, he complained of a swelling behind his left knee. Venous Doppler of the left lower leg showed the presence of a Baker's cyst. There was no evidence of DVT.  Discharge Exam: Blood pressure 131/73, pulse 56, temperature 97.4 F (36.3 C), temperature source Oral, resp. rate 20, height 5\' 9"  (1.753 m), weight 142.9 kg (315 lb 0.6 oz), SpO2 100.00%. He looks systemically well. He is not in any acute pain.  The left knee is less swollen but does show still the presence of a joint effusion. Cardiovascular: Within normal limits. Respiratory: Within normal limits. Neurological: Alert and orientated without any focal neurological signs.  Disposition: Home. Because he has no health insurance, he will likely need to followup with the health department for the time being.  Discharge Orders    Future Orders Please Complete By Expires   Diet Carb Modified      Increase activity slowly      Discharge instructions      Comments:   Stop taking hydrochlorothiazide.        SignedWilson Singer 09/03/2010, 8:31 AM

## 2010-09-04 NOTE — ED Provider Notes (Signed)
Evaluation and management procedures were performed by the PA/NP under my supervision/collaboration.    Felisa Bonier, MD 09/04/10 [phone]

## 2010-09-07 NOTE — Progress Notes (Signed)
Encounter addended by: Ree Shay, RN on: 09/07/2010  1:30 PM<BR>     Documentation filed: Charges VN

## 2010-09-22 NOTE — Progress Notes (Signed)
Encounter addended by: Clarene Critchley on: 09/22/2010 11:27 AM<BR>     Documentation filed: Flowsheet VN

## 2010-11-26 ENCOUNTER — Other Ambulatory Visit: Payer: Self-pay | Admitting: Family Medicine

## 2010-11-27 ENCOUNTER — Other Ambulatory Visit: Payer: Self-pay

## 2010-11-27 MED ORDER — COLCHICINE 0.6 MG PO TABS: 0.6000 mg | ORAL_TABLET | Freq: Two times a day (BID) | ORAL | Status: DC

## 2010-12-20 ENCOUNTER — Encounter (HOSPITAL_COMMUNITY): Payer: Self-pay | Admitting: *Deleted

## 2010-12-20 ENCOUNTER — Emergency Department (HOSPITAL_COMMUNITY)
Admission: EM | Admit: 2010-12-20 | Discharge: 2010-12-21 | Disposition: A | Discharge status: Home / Self Care | Payer: Medicaid Other | Attending: Emergency Medicine | Admitting: Emergency Medicine

## 2010-12-20 DIAGNOSIS — I10 Essential (primary) hypertension: Secondary | ICD-10-CM | POA: Insufficient documentation

## 2010-12-20 DIAGNOSIS — M109 Gout, unspecified: Secondary | ICD-10-CM

## 2010-12-20 DIAGNOSIS — E119 Type 2 diabetes mellitus without complications: Secondary | ICD-10-CM | POA: Insufficient documentation

## 2010-12-20 LAB — URINALYSIS, ROUTINE W REFLEX MICROSCOPIC
Bilirubin Urine: NEGATIVE
Glucose, UA: NEGATIVE mg/dL
Hgb urine dipstick: NEGATIVE
Leukocytes, UA: NEGATIVE
Nitrite: NEGATIVE
Protein, ur: NEGATIVE mg/dL
Specific Gravity, Urine: 1.025 (ref 1.005–1.030)
Urobilinogen, UA: 0.2 mg/dL (ref 0.0–1.0)
pH: 5.5 (ref 5.0–8.0)

## 2010-12-20 LAB — BASIC METABOLIC PANEL
BUN: 12 mg/dL (ref 6–23)
CO2: 29 mEq/L (ref 19–32)
Calcium: 9.4 mg/dL (ref 8.4–10.5)
Chloride: 97 mEq/L (ref 96–112)
Creatinine, Ser: 0.79 mg/dL (ref 0.50–1.35)
GFR calc Af Amer: >90 mL/min (ref >90)
GFR calc non Af Amer: >90 mL/min (ref >90)
Glucose, Bld: 170 mg/dL — ABNORMAL HIGH (ref 70–99)
Potassium: 3.6 mEq/L (ref 3.5–5.1)
Sodium: 134 mEq/L — ABNORMAL LOW (ref 135–145)

## 2010-12-20 MED ORDER — HYDROCODONE-ACETAMINOPHEN 5-325 MG PO TABS
2.0000 | ORAL_TABLET | Freq: Once | ORAL | Status: AC
  Administered 2010-12-20: 2 via ORAL
  Filled 2010-12-20: qty 2

## 2010-12-20 NOTE — ED Provider Notes (Signed)
History     CSN: [id number] Arrival date & time: 12/20/2010  8:28 PM   First MD Initiated Contact with Patient 12/20/10 2029      Chief Complaint  Patient presents with  . Gout    (Consider location/radiation/quality/duration/timing/severity/associated sxs/prior treatment) HPI Comments: Wife at bedside also expresses concern over history of renal insufficiency and his diet controlled diabetes.  Was treated for uti last month and has not been checked for resolution of this infection.  He denies urinary pain, frequency or other urinary complaints.  Patient is a 40 y.o. male presenting with leg pain. The history is provided by the patient.  Leg Pain  The incident occurred yesterday. There was no injury mechanism (Patient has a history of gout and reports constant discomfort in his feet , ankles and knees from this condition.  Pain has increased since yesterday.). Pain location: Pain is worst in his left heel and achilles area,  but has discomfort in both feet.  Reports last flair mostly involved his right achilles. The quality of the pain is described as sharp and throbbing. The pain is at a severity of 10/10. The pain is severe. The pain has been constant since onset. Pertinent negatives include no numbness. The symptoms are aggravated by bearing weight. He has tried acetaminophen for the symptoms. The treatment provided no relief.    Past Medical History  Diagnosis Date  . Knee pain     feet   . Gout   . Hypertension   . Diabetes mellitus   . Palpitation     Past Surgical History  Procedure Date  . Kidney stone surgery     post ureteral stent and status post lithotripsy (10-15 yrs Ago)    History reviewed. No pertinent family history.  History  Substance Use Topics  . Smoking status: Never Smoker   . Smokeless tobacco: Never Used  . Alcohol Use: No      Review of Systems  Constitutional: Negative for fever.  HENT: Negative for congestion, sore throat and neck pain.     Eyes: Negative.   Respiratory: Negative for chest tightness and shortness of breath.   Cardiovascular: Negative for chest pain.  Gastrointestinal: Negative for nausea and abdominal pain.  Genitourinary: Negative.   Musculoskeletal: Positive for arthralgias. Negative for myalgias and joint swelling.  Skin: Negative.  Negative for color change, rash and wound.  Neurological: Negative for dizziness, weakness, light-headedness, numbness and headaches.  Hematological: Negative.   Psychiatric/Behavioral: Negative.     Allergies  Clonidine derivatives and Oxycodone  Home Medications   Current Outpatient Rx  Name Route Sig Dispense Refill  . ACETAMINOPHEN 500 MG PO TABS Oral Take 1,000 mg by mouth every 6 (six) hours as needed. Pain     . AMLODIPINE BESYLATE 10 MG PO TABS Oral Take 10 mg by mouth daily.      . COLCHICINE 0.6 MG PO TABS Oral Take 1 tablet (0.6 mg total) by mouth 2 (two) times daily. 60 tablet 0  . DOXAZOSIN MESYLATE 8 MG PO TABS Oral Take 8 mg by mouth daily.     Marland Kitchen LISINOPRIL 20 MG PO TABS Oral Take 20 mg by mouth 2 (two) times daily.      Marland Kitchen METFORMIN HCL 500 MG PO TABS Oral Take 1 tablet (500 mg total) by mouth 2 (two) times daily with a meal. 60 tablet 0  . METOPROLOL TARTRATE 100 MG PO TABS Oral Take 100 mg by mouth 2 (two) times daily.      Marland Kitchen  PRAVASTATIN SODIUM 20 MG PO TABS Oral Take 20 mg by mouth daily.        BP 157/91  Pulse 116  Temp(Src) 99.6 F (37.6 C) (Oral)  Resp 20  Ht 5\' 9"  (1.753 m)  Wt 315 lb (142.883 kg)  BMI 46.52 kg/m2  SpO2 97%  Physical Exam  Nursing note and vitals reviewed. Constitutional: He is oriented to person, place, and time. He appears well-developed and well-nourished.  HENT:  Head: Normocephalic and atraumatic.  Eyes: Conjunctivae are normal.  Neck: Normal range of motion.  Cardiovascular: Normal rate, regular rhythm, normal heart sounds and intact distal pulses.   Pulmonary/Chest: Effort normal and breath sounds normal. He  has no wheezes.  Abdominal: Soft. Bowel sounds are normal. There is no tenderness.  Musculoskeletal: Normal range of motion.       TTP left lateral malleolar area with slight edema,  No erythema.  Patient displays FROM of ankle.  TTP left achilles with no evidence of trauma,  Edema or erythema.   Neurological: He is alert and oriented to person, place, and time.  Skin: Skin is warm and dry.  Psychiatric: He has a normal mood and affect.    ED Course  Procedures (including critical care time)  Labs Reviewed  BASIC METABOLIC PANEL - Abnormal; Notable for the following:    Sodium 134 (*)    Glucose, Bld 170 (*)    All other components within normal limits  URINALYSIS, ROUTINE W REFLEX MICROSCOPIC - Abnormal; Notable for the following:    Color, Urine AMBER (*) BIOCHEMICALS MAY BE AFFECTED BY COLOR   Ketones, ur TRACE (*)    All other components within normal limits   No results found.   No diagnosis found.    MDM  Patient pain improving after given hydrocodone in ed.  Also started on short 6 day prednisone taper.  Advised to check blood glucose daily while on prednisone.  Pt to f/u with pcp for further eval and tx prn.          Candis Musa, PA 12/21/10 1409

## 2010-12-20 NOTE — ED Notes (Signed)
Pt w/ hx of gout. Started w/ flare up yesterday & has become worse today. Unable to walk on today.

## 2010-12-21 MED ORDER — HYDROCODONE-ACETAMINOPHEN 7.5-500 MG PO TABS: 1.0000 | ORAL_TABLET | Freq: Four times a day (QID) | ORAL | Status: AC | PRN

## 2010-12-21 MED ORDER — PREDNISONE 20 MG PO TABS
60.0000 mg | ORAL_TABLET | Freq: Once | ORAL | Status: AC
  Administered 2010-12-21: 60 mg via ORAL
  Filled 2010-12-21: qty 3

## 2010-12-21 MED ORDER — PREDNISONE 10 MG PO TABS: ORAL_TABLET | ORAL | Status: DC

## 2010-12-21 NOTE — ED Provider Notes (Signed)
Medical screening examination/treatment/procedure(s) were performed by non-physician practitioner and as supervising physician I was immediately available for consultation/collaboration.  Dezi Schaner S. Jatorian Renault, MD 12/21/10 1612 

## 2010-12-21 NOTE — ED Notes (Signed)
Pt given discharge instructions, paperwork & prescription(s), pt verbalized understanding.   

## 2011-03-09 ENCOUNTER — Other Ambulatory Visit: Payer: Self-pay | Admitting: Adult Health

## 2011-03-09 MED ORDER — PRAVASTATIN SODIUM 20 MG PO TABS: 20.0000 mg | ORAL_TABLET | Freq: Every day | ORAL | Status: DC

## 2011-03-09 MED ORDER — LISINOPRIL 20 MG PO TABS: 20.0000 mg | ORAL_TABLET | Freq: Two times a day (BID) | ORAL | Status: DC

## 2011-03-09 MED ORDER — DOXAZOSIN MESYLATE 8 MG PO TABS: 8.0000 mg | ORAL_TABLET | Freq: Every day | ORAL | Status: DC

## 2011-03-09 MED ORDER — AMLODIPINE BESYLATE 10 MG PO TABS: 10.0000 mg | ORAL_TABLET | Freq: Every day | ORAL | Status: DC

## 2011-03-09 MED ORDER — METOPROLOL TARTRATE 100 MG PO TABS: 100.0000 mg | ORAL_TABLET | Freq: Two times a day (BID) | ORAL | Status: DC

## 2011-03-09 NOTE — Telephone Encounter (Signed)
Patient needs refills on all medicines checked above.  Also on Hydrochlorothiazide.  It is not on medication list but patient states that it was prescribed by Samara Deist. / tg

## 2011-03-13 ENCOUNTER — Other Ambulatory Visit: Payer: Self-pay | Admitting: *Deleted

## 2011-03-13 MED ORDER — METOPROLOL TARTRATE 100 MG PO TABS: 100.0000 mg | ORAL_TABLET | Freq: Two times a day (BID) | ORAL | Status: DC

## 2011-03-19 ENCOUNTER — Ambulatory Visit (INDEPENDENT_AMBULATORY_CARE_PROVIDER_SITE_OTHER): Payer: Self-pay | Admitting: Adult Health

## 2011-03-19 ENCOUNTER — Encounter: Payer: Self-pay | Admitting: Adult Health

## 2011-03-19 VITALS — BP 172/129 | HR 70 | Resp 16 | Ht 69.0 in | Wt 329.0 lb

## 2011-03-19 DIAGNOSIS — I5032 Chronic diastolic (congestive) heart failure: Secondary | ICD-10-CM

## 2011-03-19 DIAGNOSIS — I10 Essential (primary) hypertension: Secondary | ICD-10-CM

## 2011-03-19 DIAGNOSIS — I509 Heart failure, unspecified: Secondary | ICD-10-CM

## 2011-03-19 MED ORDER — HYDRALAZINE HCL 25 MG PO TABS: 25.0000 mg | ORAL_TABLET | Freq: Two times a day (BID) | ORAL | Status: DC

## 2011-03-19 MED ORDER — ISOSORBIDE MONONITRATE ER 30 MG PO TB24: 30.0000 mg | ORAL_TABLET | Freq: Every day | ORAL | Status: DC

## 2011-03-19 NOTE — Assessment & Plan Note (Addendum)
He has been off of his medications for over 2 months. BP is higher than it has been with renewed dosing of current medications. I will have a BMET checked for kidney fx, He is maxed out on his multiple BP medication doses. Will begin BiDil for BP control BID ([place] divided medications of hydralazine and isosorbide). He will return in 1 week for BP check. I will see him in a month. I have asked him to be more active and to try and lose wt again. He was doing well until he was laid off and will need to get back on track.

## 2011-03-19 NOTE — Patient Instructions (Signed)
**Note De-Identified Nicholas Turner Obfuscation** Your physician recommends that you return for lab work in: today  Your physician has recommended you make the following change in your medication: start taking Hydralazine 25 mg twice daily and Isosorbide 30 mg daily  Your physician recommends that you schedule a follow-up appointment in: 1 month

## 2011-03-19 NOTE — Progress Notes (Signed)
HPI: [name] is a very pleasant morbidly obese patient of Dr. Dietrich Pates we are following for ongoing assessment and treatment of hypertension with history of sleep apnea. He has not come to his last few appointments because he was laid off and has no insurance. Did not renew his contract with [facility] assistance program. He ran out of his medications for 2 months. He has recently started back on them 2 weeks ago.  He has gained some wt back and stopped exercising. He is frustrated with this but states it could not be helped. BP is elevated today. He denies chest pain, has ongoing DOE.   Allergies  Allergen Reactions  . Clonidine Derivatives Other (See Comments)    Welps  . Oxycodone Nausea And Vomiting    Current Outpatient Prescriptions  Medication Sig Dispense Refill  . acetaminophen (TYLENOL) 500 MG tablet Take 1,000 mg by mouth every 6 (six) hours as needed. Pain       . amLODipine (NORVASC) 10 MG tablet TAKE ONE TABLET BY MOUTH EVERY DAY  30 tablet  6  . colchicine 0.6 MG tablet Take 1 tablet (0.6 mg total) by mouth 2 (two) times daily.  60 tablet  0  . doxazosin (CARDURA) 8 MG tablet TAKE ONE TABLET BY MOUTH EVERY DAY  30 tablet  6  . hydrochlorothiazide (HYDRODIURIL) 25 MG tablet TAKE ONE TABLET BY MOUTH EVERY DAY  30 tablet  6  . lisinopril (PRINIVIL,ZESTRIL) 20 MG tablet TAKE ONE TABLET BY MOUTH TWICE DAILY  60 tablet  6  . metFORMIN (GLUCOPHAGE) 500 MG tablet Take 1 tablet (500 mg total) by mouth 2 (two) times daily with a meal.  60 tablet  0  . metoprolol (LOPRESSOR) 100 MG tablet Take 1 tablet (100 mg total) by mouth 2 (two) times daily. Pt. Is overdue for OV and needs to schedule  180 tablet  3  . pravastatin (PRAVACHOL) 20 MG tablet TAKE ONE TABLET BY MOUTH EVERY DAY  30 tablet  6  . hydrALAZINE (APRESOLINE) 25 MG tablet Take 1 tablet (25 mg total) by mouth 2 (two) times daily.  60 tablet  3  . isosorbide mononitrate (IMDUR) 30 MG 24 hr tablet Take 1 tablet (30 mg total)  by mouth daily.  30 tablet  3    Past Medical History  Diagnosis Date  . Knee pain     feet   . Gout   . Hypertension   . Diabetes mellitus   . Palpitation     Past Surgical History  Procedure Date  . Kidney stone surgery     post ureteral stent and status post lithotripsy (10-15 yrs Ago)    AVW:UJWJXB of systems complete and found to be negative unless listed above PHYSICAL EXAM BP 172/129  Pulse 70  Resp 16  Ht 5\' 9"  (1.753 m)  Wt 329 lb (149.233 kg)  BMI 48.58 kg/m2  General: Well developed, well nourished, in no acute distress Head: Eyes PERRLA, No xanthomas.   Normal cephalic and atramatic  Lungs: Clear bilaterally to auscultation and percussion diminished at the bases.Marland Kitchen Heart: HRRR S1 S2, S 4 is noted.   Pulses are 2+ & equal.            No carotid bruit. No JVD.  No abdominal bruits. No femoral bruits. Abdomen: Bowel sounds are positive, abdomen soft and non-tender without masses or                  Hernia's  noted. Msk:  Back normal, lumbering gait. Normal strength and tone for age. Extremities: No clubbing, cyanosis or edema.  DP +1 Neuro: Alert and oriented X 3. Psych:  Good affect, responds appropriately  NSR with T-wave abnormality in the lateral leads. Poor R-wave progression:  ASSESSMENT AND PLAN

## 2011-03-20 LAB — COMPREHENSIVE METABOLIC PANEL
ALT: 54 U/L — ABNORMAL HIGH (ref 0–53)
AST: 66 U/L — ABNORMAL HIGH (ref 0–37)
Albumin: 4.4 g/dL (ref 3.5–5.2)
Alkaline Phosphatase: 117 U/L (ref 39–117)
BUN: 16 mg/dL (ref 6–23)
CO2: 28 mEq/L (ref 19–32)
Calcium: 9.9 mg/dL (ref 8.4–10.5)
Chloride: 101 mEq/L (ref 96–112)
Creat: 0.84 mg/dL (ref 0.50–1.35)
Glucose, Bld: 117 mg/dL — ABNORMAL HIGH (ref 70–99)
Potassium: 4.2 mEq/L (ref 3.5–5.3)
Sodium: 142 mEq/L (ref 135–145)
Total Bilirubin: 0.4 mg/dL (ref 0.3–1.2)
Total Protein: 8.2 g/dL (ref 6.0–8.3)

## 2011-03-20 LAB — CBC WITH DIFFERENTIAL/PLATELET
Basophils Absolute: 0 10*3/uL (ref 0.0–0.1)
Basophils Relative: 1 % (ref 0–1)
Eosinophils Absolute: 0.3 10*3/uL (ref 0.0–0.7)
Eosinophils Relative: 5 % (ref 0–5)
HCT: 43 % (ref 39.0–52.0)
Hemoglobin: 13.7 g/dL (ref 13.0–17.0)
Lymphocytes Relative: 48 % — ABNORMAL HIGH (ref 12–46)
Lymphs Abs: 2.7 10*3/uL (ref 0.7–4.0)
MCH: 29.1 pg (ref 26.0–34.0)
MCHC: 31.9 g/dL (ref 30.0–36.0)
MCV: 91.5 fL (ref 78.0–100.0)
Monocytes Absolute: 0.4 10*3/uL (ref 0.1–1.0)
Monocytes Relative: 7 % (ref 3–12)
Neutro Abs: 2.2 10*3/uL (ref 1.7–7.7)
Neutrophils Relative %: 39 % — ABNORMAL LOW (ref 43–77)
Platelets: 295 10*3/uL (ref 150–400)
RBC: 4.7 MIL/uL (ref 4.22–5.81)
RDW: 13.3 % (ref 11.5–15.5)
WBC: 5.7 10*3/uL (ref 4.0–10.5)

## 2011-03-20 LAB — URIC ACID: Uric Acid, Serum: 9.3 mg/dL — ABNORMAL HIGH (ref 4.0–7.8)

## 2011-03-29 ENCOUNTER — Telehealth: Payer: Self-pay | Admitting: *Deleted

## 2011-03-29 NOTE — Telephone Encounter (Signed)
Pt states his HCTZ was increased his last visit with Samara Deist. Please advised and send to [facility]

## 2011-03-29 NOTE — Telephone Encounter (Signed)
**Note De-Identified Dorrene Bently Obfuscation** On last OV with Joni Reining, NP on 03-19-11 pt. states that Nicholas Turner told him to increase HCTZ to bid. Last OV notes reviewed and there is no mention of increase but there is recommendations to start taking Hydralazine 25 mg bid and Isosorbide 30 mg qd which pt. states he is taking. Please advise./LV

## 2011-03-30 MED ORDER — HYDROCHLOROTHIAZIDE 25 MG PO TABS: 25.0000 mg | ORAL_TABLET | Freq: Every day | ORAL | Status: DC

## 2011-03-30 NOTE — Telephone Encounter (Signed)
Take HCTZ daily as originally prescribed and the hydralazine/isosorbide BID as directed BID,.  Nicholas Turner

## 2011-03-30 NOTE — Telephone Encounter (Signed)
**Note De-Identified Nicholas Turner Obfuscation** Pt. advised, he verbalized understanding. Per pt. request, a refill for HCTZ sent to [facility] in [place] as pt. has been taking BID./LV

## 2011-04-02 ENCOUNTER — Ambulatory Visit (INDEPENDENT_AMBULATORY_CARE_PROVIDER_SITE_OTHER): Payer: Self-pay

## 2011-04-02 VITALS — BP 149/98 | HR 72 | Ht 69.0 in | Wt 332.0 lb

## 2011-04-02 DIAGNOSIS — I10 Essential (primary) hypertension: Secondary | ICD-10-CM

## 2011-04-03 NOTE — Progress Notes (Signed)
**Note De-Identified [name] Obfuscation** S: Pt. arrives in office for a 1 week BP check. B: On last OV with Joni Reining, NP pt. was advised to start taking hydralazine 25 mg BID and Isosorbide 30 mg daily. A: Pt. has no complaints at this time. His BP today is elevated at 149/98 and at last OV BP was 172/129. He states he takes his morning meds at around 8:30am every morning. Pt. Is due to f/u on 3-25 with Joni Reining, NP. R: Pt. Is advised to continue current medical treatment and that we will call with Harriet Pho, NP's recommendations./LV

## 2011-04-04 IMAGING — CR DG ANKLE COMPLETE 3+V*L*
3 series · 3 of 3 positions shown · non-contrast
Comparison: 09/05/2006

CLINICAL DATA: Bilateral ankle pain.

LEFT ANKLE COMPLETE - 3+ VIEW

[view not recorded (1 of 3)]
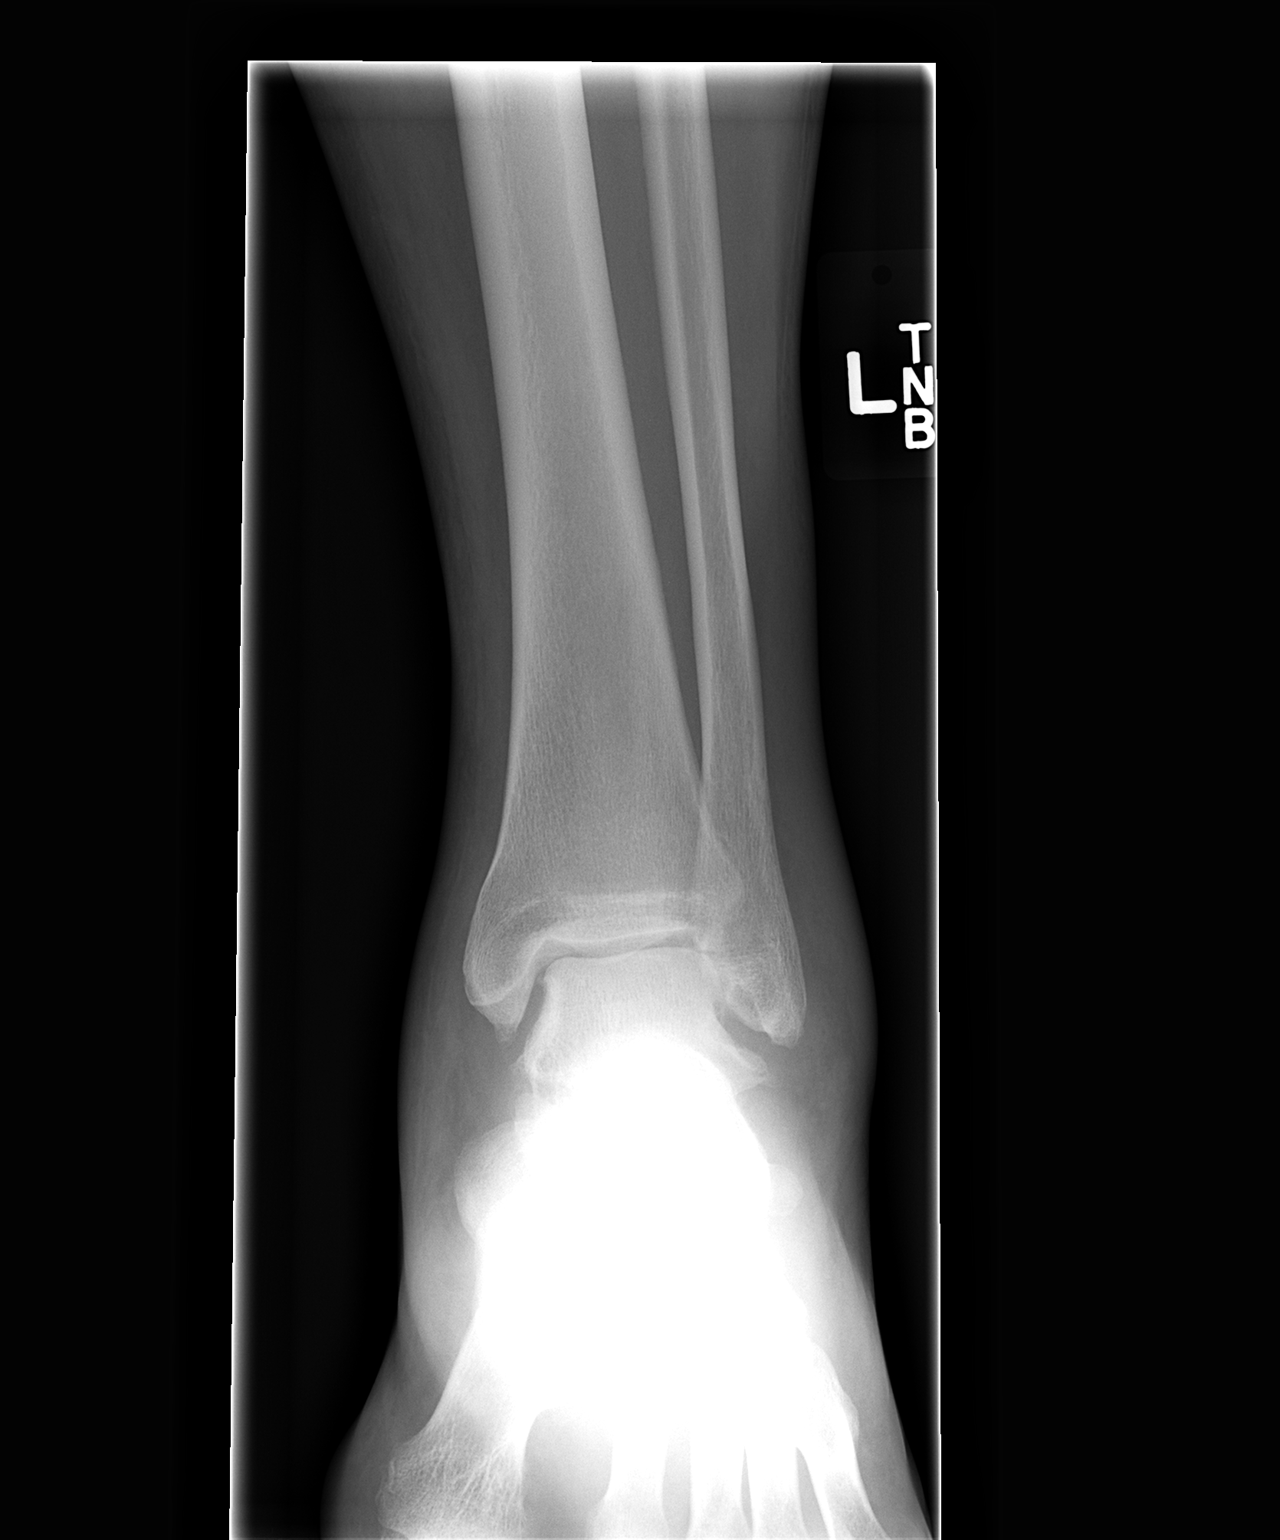

[view not recorded (2 of 3)]
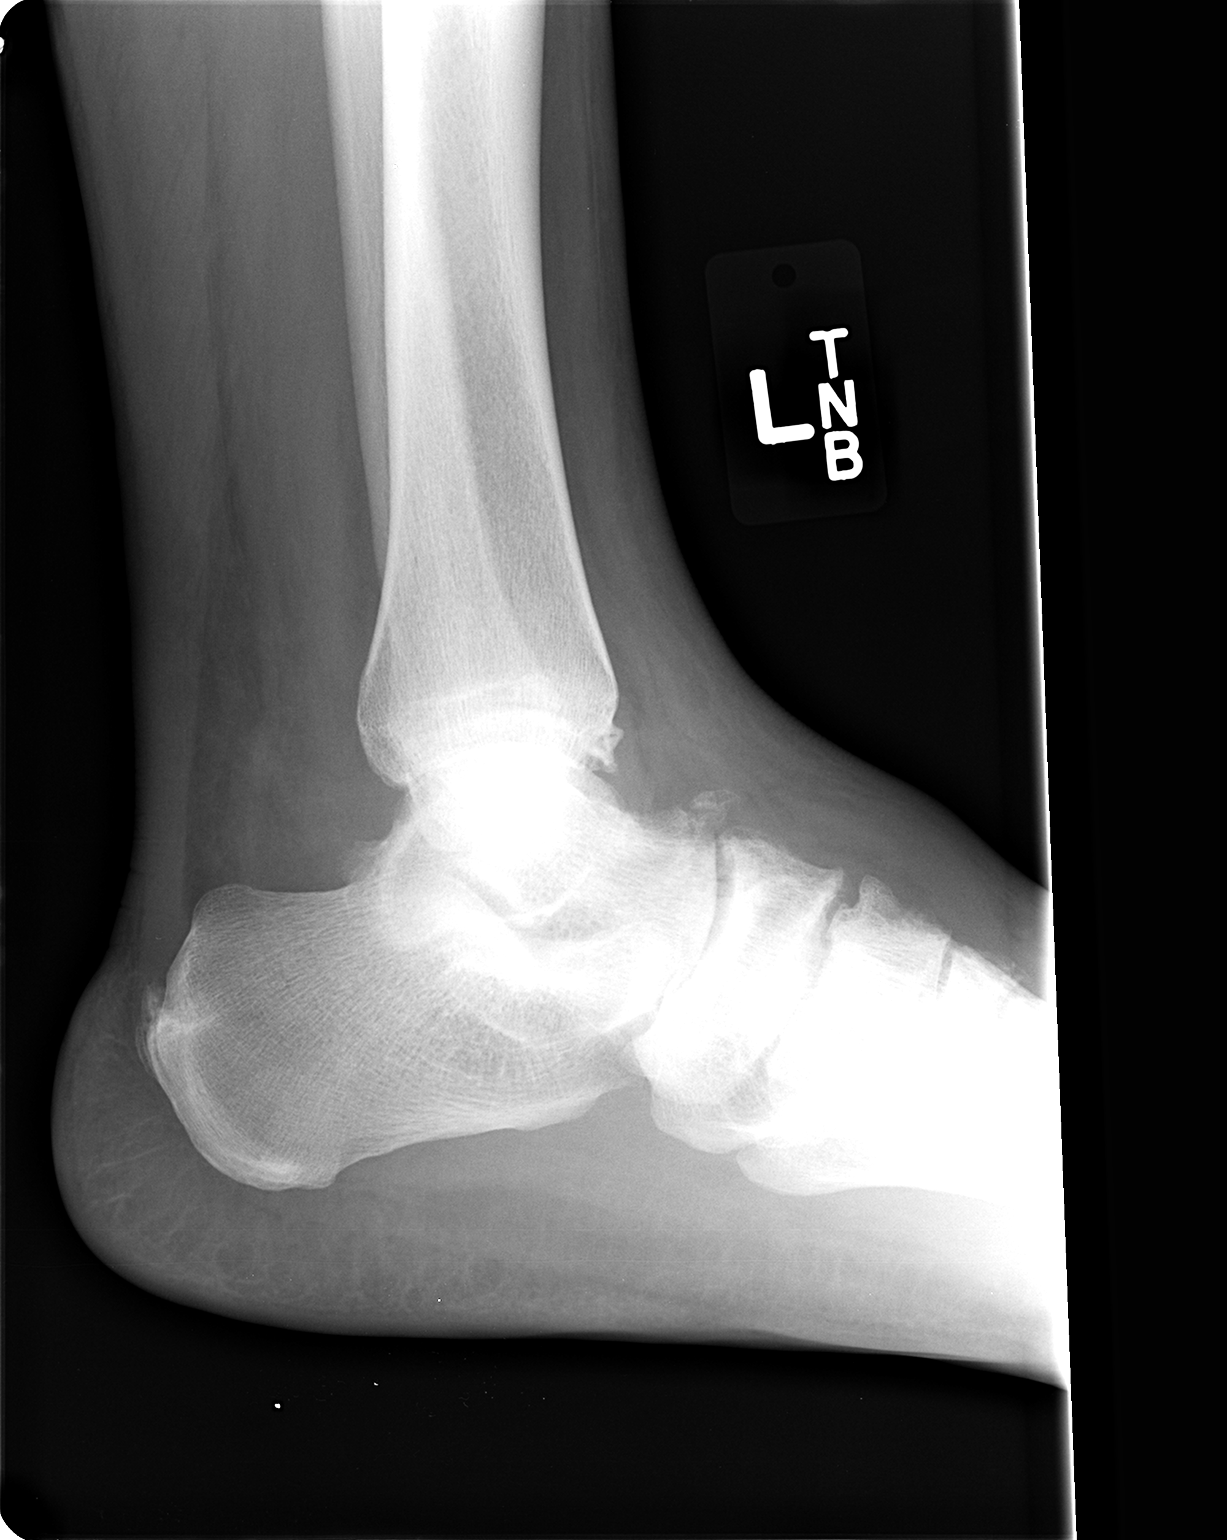

[view not recorded (3 of 3)]
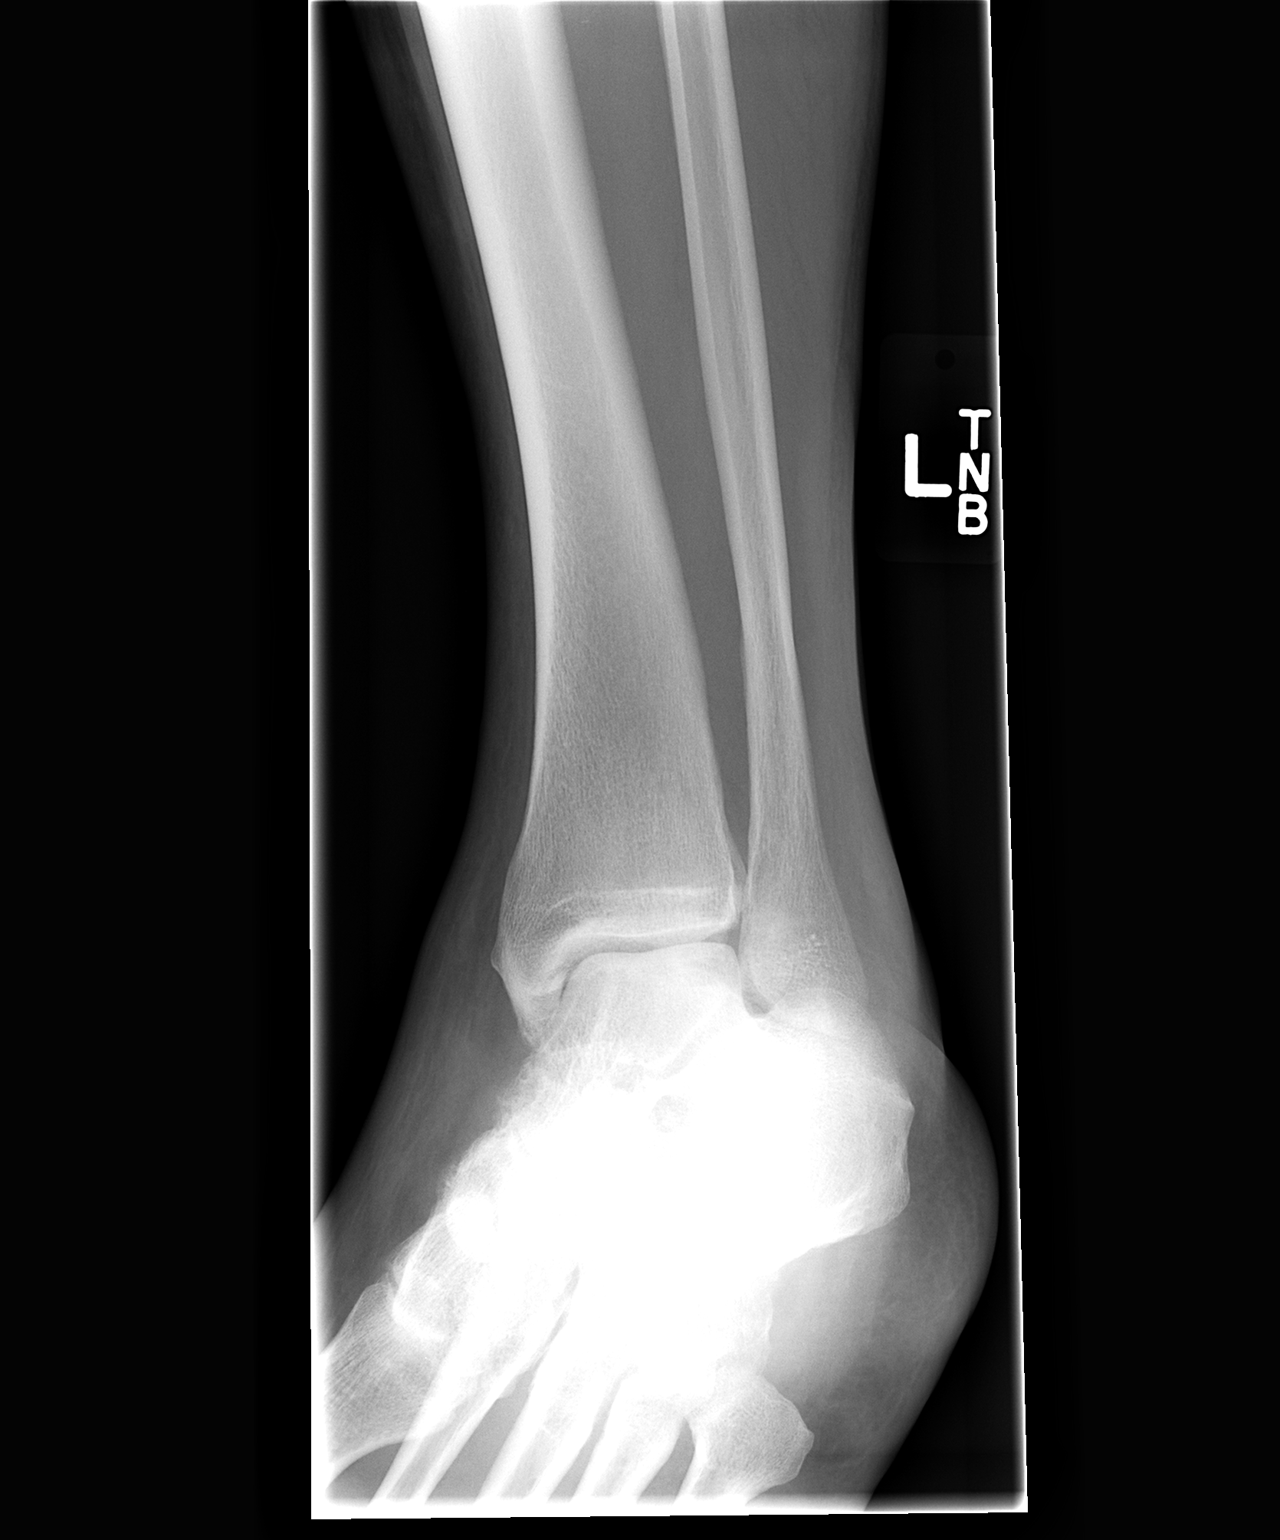

[3 of 3 positions shown; findings below may reference images not displayed]

FINDINGS: Lateral soft tissue swelling is noted.  There are mild
degenerative changes within the left ankle. No acute bony
abnormality.  Specifically, no fracture, subluxation, or
dislocation.  Soft tissues are intact.
IMPRESSION: Mild degenerative changes.  No acute findings.

## 2011-04-04 IMAGING — CR DG CHEST 1V PORT
1 series · 1 of 1 positions shown · non-contrast
Comparison: 01/25/2009

CLINICAL DATA: Short of breath/lower extremity swelling

PORTABLE CHEST - 1 VIEW

[view not recorded]
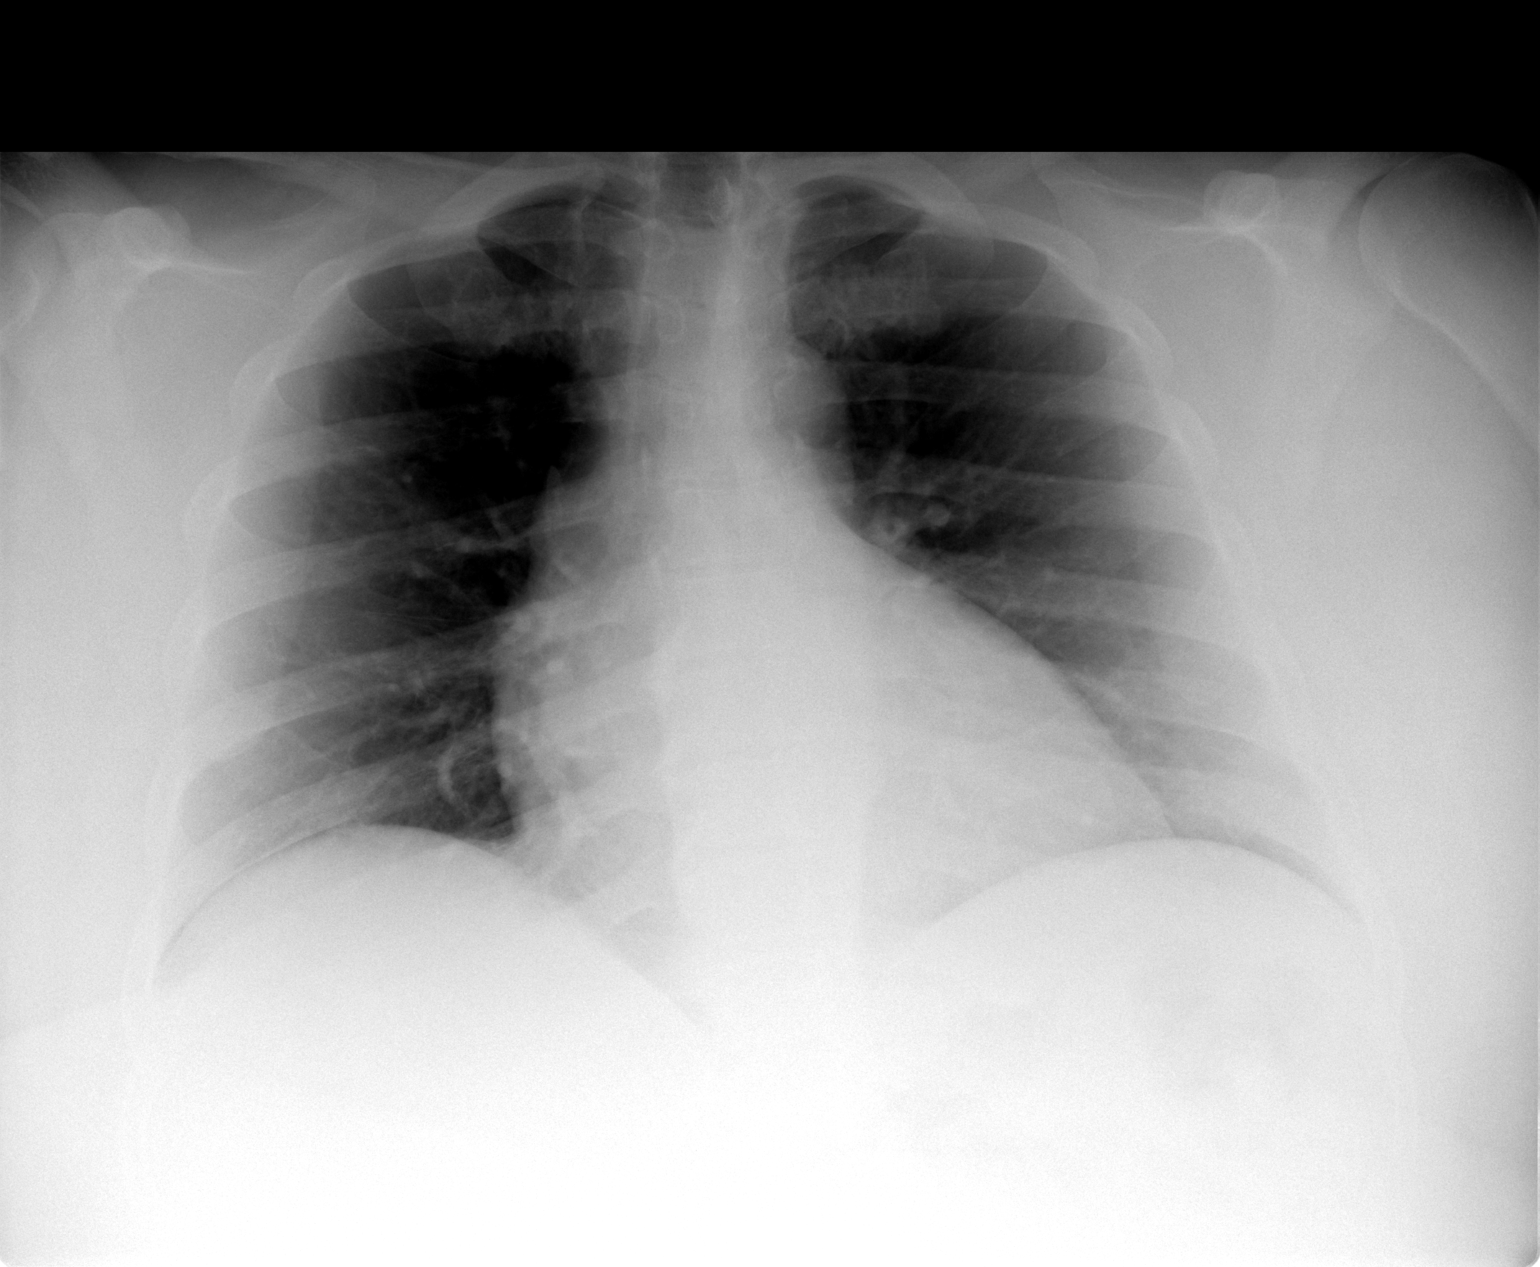

[1 of 1 positions shown; findings below may reference images not displayed]

FINDINGS: Heart and mediastinal contours normal.  Lungs clear.
Osseous structures intact in one-view.
IMPRESSION: No active disease in one-view.

## 2011-04-05 NOTE — Progress Notes (Signed)
Reviewed. No changes. BMET this week for renal function on HCTZ

## 2011-04-09 ENCOUNTER — Other Ambulatory Visit: Payer: Self-pay

## 2011-04-09 DIAGNOSIS — I10 Essential (primary) hypertension: Secondary | ICD-10-CM

## 2011-04-09 NOTE — Progress Notes (Signed)
**Note De-Identified Marbin Olshefski Obfuscation** Pt. advised, he verbalized understanding and states he will have BMET drawn this week. BMET order faxed to [facility] lab./LV

## 2011-04-11 LAB — BASIC METABOLIC PANEL
BUN: 11 mg/dL (ref 6–23)
CO2: 26 mEq/L (ref 19–32)
Calcium: 9.4 mg/dL (ref 8.4–10.5)
Chloride: 99 mEq/L (ref 96–112)
Creat: 0.85 mg/dL (ref 0.50–1.35)
Glucose, Bld: 192 mg/dL — ABNORMAL HIGH (ref 70–99)
Potassium: 4.3 mEq/L (ref 3.5–5.3)
Sodium: 138 mEq/L (ref 135–145)

## 2011-04-16 ENCOUNTER — Ambulatory Visit (INDEPENDENT_AMBULATORY_CARE_PROVIDER_SITE_OTHER): Payer: Self-pay | Admitting: Adult Health

## 2011-04-16 ENCOUNTER — Encounter: Payer: Self-pay | Admitting: *Deleted

## 2011-04-16 ENCOUNTER — Encounter: Payer: Self-pay | Admitting: Adult Health

## 2011-04-16 DIAGNOSIS — E119 Type 2 diabetes mellitus without complications: Secondary | ICD-10-CM

## 2011-04-16 DIAGNOSIS — I10 Essential (primary) hypertension: Secondary | ICD-10-CM

## 2011-04-16 DIAGNOSIS — I5030 Unspecified diastolic (congestive) heart failure: Secondary | ICD-10-CM

## 2011-04-16 DIAGNOSIS — R110 Nausea: Secondary | ICD-10-CM

## 2011-04-16 DIAGNOSIS — R7989 Other specified abnormal findings of blood chemistry: Secondary | ICD-10-CM

## 2011-04-16 NOTE — Progress Notes (Signed)
HPI: Nicholas Turner is a very pleasant morbidly obese patient of Dr. Dietrich Pates we are following for ongoing assessment and treatment of hypertension with history of sleep apnea. He has not come to his last few appointments because he was laid off and has no insurance. Did not renew his contract with [facility] assistance program. He ran out of his medications for 2 months. He has recently started back on them 2 weeks ago.  He has gained some wt back and stopped exercising. Marland Kitchen He denies chest pain, has ongoing DOE. Onlast visit, he was started on Bidil BID, He is here for recheck after these changes and to discuss labs.He has been having complaints of low grade nausea over the last few weeks.  Allergies  Allergen Reactions  . Clonidine Derivatives Other (See Comments)    Welps  . Oxycodone Nausea And Vomiting    Current Outpatient Prescriptions  Medication Sig Dispense Refill  . acetaminophen (TYLENOL) 500 MG tablet Take 1,000 mg by mouth every 6 (six) hours as needed. Pain       . amLODipine (NORVASC) 10 MG tablet TAKE ONE TABLET BY MOUTH EVERY DAY  30 tablet  6  . colchicine 0.6 MG tablet Take 1 tablet (0.6 mg total) by mouth 2 (two) times daily.  60 tablet  0  . doxazosin (CARDURA) 8 MG tablet TAKE ONE TABLET BY MOUTH EVERY DAY  30 tablet  6  . hydrALAZINE (APRESOLINE) 25 MG tablet Take 1 tablet (25 mg total) by mouth 2 (two) times daily.  60 tablet  3  . hydrochlorothiazide (HYDRODIURIL) 25 MG tablet Take 1 tablet (25 mg total) by mouth daily.  30 tablet  6  . isosorbide mononitrate (IMDUR) 30 MG 24 hr tablet Take 1 tablet (30 mg total) by mouth daily.  30 tablet  3  . lisinopril (PRINIVIL,ZESTRIL) 20 MG tablet TAKE ONE TABLET BY MOUTH TWICE DAILY  60 tablet  6  . metoprolol (LOPRESSOR) 100 MG tablet Take 1 tablet (100 mg total) by mouth 2 (two) times daily. Pt. Is overdue for OV and needs to schedule  180 tablet  3  . pravastatin (PRAVACHOL) 20 MG tablet TAKE ONE TABLET BY MOUTH EVERY DAY  30  tablet  6    Past Medical History  Diagnosis Date  . Knee pain     feet   . Gout   . Hypertension   . Diabetes mellitus   . Palpitation     Past Surgical History  Procedure Date  . Kidney stone surgery     post ureteral stent and status post lithotripsy (10-15 yrs Ago)    AVW:UJWJXB of systems complete and found to be negative unless listed above PHYSICAL EXAM BP 152/93  Pulse 63  Ht 5\' 9"  (1.753 m)  Wt 334 lb (151.501 kg)  BMI 49.32 kg/m2  General: Well developed, well nourished, in no acute distress Head: Eyes PERRLA, No xanthomas.   Normal cephalic and atramatic  Lungs: Clear bilaterally to auscultation and percussion diminished at the bases.Marland Kitchen Heart: HRRR S1 S2,.   Pulses are 2+ & equal.            No carotid bruit. No JVD.  No abdominal bruits. No femoral bruits. Abdomen: Bowel sounds are positive, abdomen soft and non-tender without masses or  Hernia's noted.Obese Msk:  Back normal, lumbering gait. Normal strength and tone for age. Extremities: No clubbing, cyanosis or edema.  DP +1 Neuro: Alert and oriented X 3. Psych:  Peri [place]  affect, responds appropriately  NSR with T-wave abnormality in the lateral leads. Poor R-wave progression:  ASSESSMENT AND PLAN

## 2011-04-16 NOTE — Assessment & Plan Note (Signed)
He has daily episodes but no actual vomiting. This has been going on for several months. He states in the past he has had trouble with his GB but no actual stones were found. On review of recent labs, he has elevation in AST to 66.  This is new from prior labs.  I will order a HIDA scan to evaluate for biliary dyskinesia.  He also has history of diabetes in the past, now controlled without medications.  He may be having some gastroparesis.  Will defer to PCP for work-up.

## 2011-04-16 NOTE — Patient Instructions (Addendum)
Your physician recommends that you schedule a follow-up appointment in: 1 month  Referral has been made to Internal medicine at [facility] for [facility]. They will call you with an appointment.  If you do not hear from them within the week, call our office.  Hida scan will be scheduled to evaluate gallbladder

## 2011-04-16 NOTE — Assessment & Plan Note (Signed)
Nicholas Turner is doing better with BP control with addition of BiDil. He is not at optimal level but significantly improved from last visit. Will continue to follow him for this. If necessary, will increase BiDil to TID instead of adding another medication. He will need follow-up labs on next visit.  He continues to use CPAP as directed.  He is now part of the [facility] Assistance program for help with medications and other services.   He needs a PCP. He is referred to the [facility] on the campus of [facility] hospital for PCP evaluation.

## 2011-04-18 ENCOUNTER — Telehealth: Payer: Self-pay | Admitting: *Deleted

## 2011-04-18 NOTE — Telephone Encounter (Signed)
Called to advise patient that The [facility] at [place] is not taking any new patients at this time and they do not anticipate taking any in the near future.    

## 2011-04-19 ENCOUNTER — Encounter (HOSPITAL_COMMUNITY): Payer: Self-pay

## 2011-04-19 ENCOUNTER — Other Ambulatory Visit: Payer: Self-pay | Admitting: Adult Health

## 2011-04-19 ENCOUNTER — Ambulatory Visit (HOSPITAL_COMMUNITY)
Admission: RE | Admit: 2011-04-19 | Discharge: 2011-04-19 | Disposition: A | Discharge status: Home / Self Care | Payer: Self-pay | Source: Ambulatory Visit | Attending: Adult Health | Admitting: Adult Health

## 2011-04-19 DIAGNOSIS — R7989 Other specified abnormal findings of blood chemistry: Secondary | ICD-10-CM

## 2011-04-19 DIAGNOSIS — R109 Unspecified abdominal pain: Secondary | ICD-10-CM | POA: Insufficient documentation

## 2011-04-19 DIAGNOSIS — R112 Nausea with vomiting, unspecified: Secondary | ICD-10-CM | POA: Insufficient documentation

## 2011-04-19 MED ORDER — TECHNETIUM TC 99M MEBROFENIN IV KIT
5.0000 | PACK | Freq: Once | INTRAVENOUS | Status: AC | PRN
  Administered 2011-04-19: 5 via INTRAVENOUS

## 2011-04-19 MED ORDER — SINCALIDE 5 MCG IJ SOLR
INTRAMUSCULAR | Status: AC
  Administered 2011-04-19: 3.04 ug via INTRAVENOUS
  Filled 2011-04-19: qty 5

## 2011-04-23 ENCOUNTER — Telehealth: Payer: Self-pay | Admitting: Adult Health

## 2011-04-23 NOTE — Telephone Encounter (Signed)
PT CALLING FOR GALLBLADDER SCAN RESULTS DONE LAST WEEK/TMJ

## 2011-04-23 NOTE — Telephone Encounter (Signed)
**Note De-identified Samba Cumba Obfuscation** Pt. advised, he verbalized understanding./LV 

## 2011-05-18 ENCOUNTER — Ambulatory Visit (INDEPENDENT_AMBULATORY_CARE_PROVIDER_SITE_OTHER): Payer: Self-pay | Admitting: Adult Health

## 2011-05-18 ENCOUNTER — Encounter: Payer: Self-pay | Admitting: Adult Health

## 2011-05-18 VITALS — BP 128/82 | HR 78 | Resp 18 | Ht 69.0 in | Wt 338.0 lb

## 2011-05-18 DIAGNOSIS — I10 Essential (primary) hypertension: Secondary | ICD-10-CM

## 2011-05-18 DIAGNOSIS — R110 Nausea: Secondary | ICD-10-CM

## 2011-05-18 NOTE — Progress Notes (Signed)
   HPI: Mr. Sheahan is a morbidly obese patient of Dr. Dietrich Pates we are following for ongoing assessment and treatment of hypertension, with history of sleep apnea and hypercholesterolemia. He is still looking for PCP. On last visit he was complaining of nausea and has had a history of gallstones. He was not a candidate for surgical intervention at that time. I checked a HIDA and continued him on BilDil, hydralazine, and cardura.  He is here without complaints.  Nausea is now intermittent.   Allergies  Allergen Reactions  . Clonidine Derivatives Other (See Comments)    Welps  . Oxycodone Nausea And Vomiting    Current Outpatient Prescriptions  Medication Sig Dispense Refill  . acetaminophen (TYLENOL) 500 MG tablet Take 1,000 mg by mouth every 6 (six) hours as needed. Pain       . amLODipine (NORVASC) 10 MG tablet TAKE ONE TABLET BY MOUTH EVERY DAY  30 tablet  6  . colchicine 0.6 MG tablet Take 1 tablet (0.6 mg total) by mouth 2 (two) times daily.  60 tablet  0  . doxazosin (CARDURA) 8 MG tablet TAKE ONE TABLET BY MOUTH EVERY DAY  30 tablet  6  . hydrALAZINE (APRESOLINE) 25 MG tablet Take 1 tablet (25 mg total) by mouth 2 (two) times daily.  60 tablet  3  . hydrochlorothiazide (HYDRODIURIL) 25 MG tablet Take 1 tablet (25 mg total) by mouth daily.  30 tablet  6  . isosorbide mononitrate (IMDUR) 30 MG 24 hr tablet Take 1 tablet (30 mg total) by mouth daily.  30 tablet  3  . lisinopril (PRINIVIL,ZESTRIL) 20 MG tablet TAKE ONE TABLET BY MOUTH TWICE DAILY  60 tablet  6  . metoprolol (LOPRESSOR) 100 MG tablet Take 1 tablet (100 mg total) by mouth 2 (two) times daily. Pt. Is overdue for OV and needs to schedule  180 tablet  3  . pravastatin (PRAVACHOL) 20 MG tablet TAKE ONE TABLET BY MOUTH EVERY DAY  30 tablet  6  . DISCONTD: metFORMIN (GLUCOPHAGE) 500 MG tablet Take 1 tablet (500 mg total) by mouth 2 (two) times daily with a meal.  60 tablet  0    Past Medical History  Diagnosis Date  . Knee pain      feet   . Gout   . Hypertension   . Diabetes mellitus   . Palpitation     Past Surgical History  Procedure Date  . Kidney stone surgery     post ureteral stent and status post lithotripsy (10-15 yrs Ago)    AVW:UJWJXB of systems complete and found to be negative unless listed above PHYSICAL EXAM BP 128/82  Pulse 78  Resp 18  Ht 5\' 9"  (1.753 m)  Wt 338 lb (153.316 kg)  BMI 49.91 kg/m2 General: Well developed, well nourished, in no acute distress Head: Eyes PERRLA, No xanthomas.   Normal cephalic and atramatic  Lungs: Clear bilaterally to auscultation and percussion. Heart: HRRR S1 S2, without MRG.  Pulses are 2+ & equal.            No carotid bruit. No JVD.  No abdominal bruits. No femoral bruits. Abdomen: Bowel sounds are positive, abdomen soft and non-tender without masses or  Hernia's noted. Msk:  Back normal, normal gait. Normal strength and tone for age. Extremities: No clubbing, cyanosis or edema.  DP +1 Neuro: Alert and oriented X 3. Psych:  Good affect, responds appropriately   ASSESSMENT AND PLAN

## 2011-05-18 NOTE — Patient Instructions (Signed)
**Note De-identified Nicholas Turner Obfuscation** Your physician recommends that you continue on your current medications as directed. Please refer to the Current Medication list given to you today.  Your physician recommends that you schedule a follow-up appointment in: 9 months  

## 2011-05-18 NOTE — Assessment & Plan Note (Signed)
Blood pressure remains stable on current medications. No changes. He will be seen in 9 months unless he is symptomatic.

## 2011-05-18 NOTE — Assessment & Plan Note (Signed)
HIDA scan was negative for biliary dyskinesia.  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 It is my hope the he finds a PCP in the [facility] system with an opening to address his noncardiac issues.

## 2011-07-17 ENCOUNTER — Emergency Department (HOSPITAL_COMMUNITY)
Admission: EM | Admit: 2011-07-17 | Discharge: 2011-07-17 | Disposition: A | Discharge status: Home / Self Care | Payer: Self-pay | Attending: Emergency Medicine | Admitting: Emergency Medicine

## 2011-07-17 ENCOUNTER — Encounter (HOSPITAL_COMMUNITY): Payer: Self-pay | Admitting: *Deleted

## 2011-07-17 DIAGNOSIS — M25579 Pain in unspecified ankle and joints of unspecified foot: Secondary | ICD-10-CM | POA: Insufficient documentation

## 2011-07-17 DIAGNOSIS — M25561 Pain in right knee: Secondary | ICD-10-CM

## 2011-07-17 DIAGNOSIS — I10 Essential (primary) hypertension: Secondary | ICD-10-CM | POA: Insufficient documentation

## 2011-07-17 DIAGNOSIS — M109 Gout, unspecified: Secondary | ICD-10-CM | POA: Insufficient documentation

## 2011-07-17 DIAGNOSIS — E119 Type 2 diabetes mellitus without complications: Secondary | ICD-10-CM | POA: Insufficient documentation

## 2011-07-17 MED ORDER — METHYLPREDNISOLONE SODIUM SUCC 125 MG IJ SOLR
125.0000 mg | Freq: Once | INTRAMUSCULAR | Status: AC
  Administered 2011-07-17: 125 mg via INTRAMUSCULAR
  Filled 2011-07-17: qty 2

## 2011-07-17 MED ORDER — OXYCODONE-ACETAMINOPHEN 5-325 MG PO TABS: 1.0000 | ORAL_TABLET | Freq: Four times a day (QID) | ORAL | Status: AC | PRN

## 2011-07-17 MED ORDER — PREDNISONE 50 MG PO TABS: 50.0000 mg | ORAL_TABLET | Freq: Every day | ORAL | Status: AC

## 2011-07-17 NOTE — Discharge Instructions (Signed)
Arthralgia Arthralgia is joint pain. A joint is a place where two bones meet. Joint pain can happen for many reasons. The joint can be bruised, stiff, infected, or weak from aging. Pain usually goes away after resting and taking medicine for soreness.  HOME CARE  Rest the joint as told by your doctor.   Keep the sore joint raised (elevated) for the first 24 hours.   Put ice on the joint area.   Put ice in a plastic bag.   Place a towel between your skin and the bag.   Leave the ice on for 15 to 20 minutes, 3 to 4 times a day.   Wear your splint, casting, elastic bandage, or sling as told by your doctor.   Only take medicine as told by your doctor. Do not take aspirin.   Use crutches as told by your doctor. Do not put weight on the joint until told to by your doctor.  GET HELP RIGHT AWAY IF:   You have bruising, puffiness (swelling), or more pain.   Your fingers or toes turn blue or start to lose feeling (numb).   Your medicine does not lessen the pain.   Your pain becomes severe.   You have a temperature by mouth above 102 F (38.9 C), not controlled by medicine.   You cannot move or use the joint.  MAKE SURE YOU:   Understand these instructions.   Will watch your condition.   Will get help right away if you are not doing well or get worse.  Document Released: 12/27/2008 Document Revised: 12/28/2010 Document Reviewed: 12/27/2008 [facility] Patient Information 2012 [place], [place].  Ibuprofen 200 mg 4 tablets 3 times a day for a short period of time.  Medication for pain.  Start prednisone pills tomorrow. Ice. Elevate. Follow up with a rheumatologist

## 2011-07-17 NOTE — ED Provider Notes (Signed)
This chart was scribed for Donnetta Hutching, MD by Wallis Mart. The patient was seen in room APA12/APA12 and the patient's care was started at 4:24 PM.   CSN: [id number]  Arrival date & time 07/17/11  1559   First MD Initiated Contact with Patient 07/17/11 1610      Chief Complaint  Patient presents with  . Ankle Pain  . Knee Pain    (Consider location/radiation/quality/duration/timing/severity/associated sxs/prior treatment) HPI  Nicholas Turner is a 41 y.o. male who presents to the Emergency Department complaining of sudden onset, persistence of constant, gradually worsening pain and swelling to right knee and ankle onset 3 days ago. Pt w./ associated fever (100.1) Pt states that he was weedeating in his yard on Saturday which triggered the pain . Pt w/ h/o gout which causes similar pain, although the pain usually occurs in both ankles and knees. Pt has had multiple episodes of inflammation and pain that occur after he spends time walking around. Pt c/o leg spasms and knots on the back of his calves as well. Pt is usually given prednisone which temporarily relieves the pain and inflammation. Pt takes colchicine and tylenol/ibuprofen w/ mild relief of sx. There are no other associated symptoms and no other alleviating or aggravating factors.     Rheumatologist: Dr Conception Chancy ??  PCP: Milford Cage  Past Medical History  Diagnosis Date  . Knee pain     feet   . Gout   . Hypertension   . Diabetes mellitus   . Palpitation     Past Surgical History  Procedure Date  . Kidney stone surgery     post ureteral stent and status post lithotripsy (10-15 yrs Ago)    History reviewed. No pertinent family history.  History  Substance Use Topics  . Smoking status: Never Smoker   . Smokeless tobacco: Never Used  . Alcohol Use: No      Review of Systems  10 Systems reviewed and all are negative for acute change except as noted in the HPI.    Allergies  Clonidine derivatives and  Oxycodone  Home Medications   Current Outpatient Rx  Name Route Sig Dispense Refill  . ACETAMINOPHEN 500 MG PO TABS Oral Take 1,000 mg by mouth every 6 (six) hours as needed. Pain     . AMLODIPINE BESYLATE 10 MG PO TABS  TAKE ONE TABLET BY MOUTH EVERY DAY 30 tablet 6  . COLCHICINE 0.6 MG PO TABS Oral Take 1 tablet (0.6 mg total) by mouth 2 (two) times daily. 60 tablet 0  . DOXAZOSIN MESYLATE 8 MG PO TABS  TAKE ONE TABLET BY MOUTH EVERY DAY 30 tablet 6  . HYDRALAZINE HCL 25 MG PO TABS Oral Take 1 tablet (25 mg total) by mouth 2 (two) times daily. 60 tablet 3  . HYDROCHLOROTHIAZIDE 25 MG PO TABS Oral Take 1 tablet (25 mg total) by mouth daily. 30 tablet 6  . ISOSORBIDE MONONITRATE ER 30 MG PO TB24 Oral Take 1 tablet (30 mg total) by mouth daily. 30 tablet 3  . LISINOPRIL 20 MG PO TABS  TAKE ONE TABLET BY MOUTH TWICE DAILY 60 tablet 6  . METOPROLOL TARTRATE 100 MG PO TABS Oral Take 1 tablet (100 mg total) by mouth 2 (two) times daily. Pt. Is overdue for OV and needs to schedule 180 tablet 3    Pt. Is overdue for OV and needs to schedule  . PRAVASTATIN SODIUM 20 MG PO TABS  TAKE ONE TABLET BY MOUTH EVERY  DAY 30 tablet 6    BP 188/127  Pulse 118  Temp 100.1 F (37.8 C) (Oral)  Resp 20  Ht 5\' 9"  (1.753 m)  Wt 330 lb (149.687 kg)  BMI 48.73 kg/m2  SpO2 100%  Physical Exam  Nursing note and vitals reviewed. Constitutional: He is oriented to person, place, and time. He appears well-developed and well-nourished. No distress.  HENT:  Head: Normocephalic and atraumatic.  Eyes: EOM are normal. Pupils are equal, round, and reactive to light.  Neck: Normal range of motion. Neck supple. No tracheal deviation present.  Cardiovascular: Normal rate and regular rhythm.   Pulmonary/Chest: Effort normal and breath sounds normal. No respiratory distress.  Abdominal: Soft. He exhibits no distension.  Musculoskeletal: Normal range of motion. He exhibits tenderness.       Right knee is puffy, tender to  anterior knee,  mild bilateral ankle tenderness   Neurological: He is alert and oriented to person, place, and time. No sensory deficit.  Skin: Skin is warm and dry.  Psychiatric: He has a normal mood and affect. His behavior is normal.    ED Course  Procedures (including critical care time) DIAGNOSTIC STUDIES: Oxygen Saturation is 100% on room air, normal by my interpretation.    COORDINATION OF CARE:  4:38 - Pt to be given a shot of steroids, pain medication,  given rx for prednisone, rx for pain medication  Labs Reviewed - No data to display No results found.   No diagnosis found.    MDM  Patient has long-standing history of gouty arthritis. Symptoms appear to be the same. Rx IM solumedrol, discharge home on oral prednisone and pain medicine.  Encouraged to see a rheumatologist  I personally performed the services described in this documentation, which was scribed in my presence. The recorded information has been reviewed and considered.       Donnetta Hutching, MD 07/17/11 [phone]

## 2011-07-17 NOTE — ED Notes (Signed)
Patient with no complaints at this time. Respirations even and unlabored. Skin warm/dry. Discharge instructions reviewed with patient at this time. Patient given opportunity to voice concerns/ask questions. Patient discharged at this time and left Emergency Department with steady gait.   

## 2011-07-17 NOTE — ED Notes (Signed)
C/o pain and swelling to right knee and right ankle that started on Saturday, pt also states that he has been experiencing numbness to left hand and sometimes the right hand as well, pt states that this started 6 months ago.

## 2011-08-07 ENCOUNTER — Other Ambulatory Visit: Payer: Self-pay | Admitting: Adult Health

## 2011-08-08 ENCOUNTER — Other Ambulatory Visit: Payer: Self-pay | Admitting: Adult Health

## 2011-08-08 NOTE — Telephone Encounter (Signed)
COLCHICINE NEEDS CALLED, THEY STATE WE DO NOT NORMALLY FILL BUT THEY HAVE NOT SEEN PCP IN OVER A YEAR. STATES HE DOESN'T HAVE ONE.  TOLD WIFE THAT WE TYPICALLY DO NOT FILL PCP MEDS BUT I WOULD CHECK WITH THE NURSE.  IF WE ARE GOING TO FILL PLEASE SEND TO [facility] FA[facility] [phone]/ RX # [id number]

## 2011-08-08 NOTE — Telephone Encounter (Signed)
**Note De-Identified Zelma Snead Obfuscation** Is it ok to fill [facility]? Please advise./LV

## 2011-08-09 MED ORDER — COLCHICINE 0.6 MG PO TABS: 0.6000 mg | ORAL_TABLET | Freq: Two times a day (BID) | ORAL | Status: DC

## 2011-08-09 NOTE — Telephone Encounter (Signed)
Give on month's supply only. Advise to find PCP

## 2011-08-09 NOTE — Telephone Encounter (Signed)
As discussed. No more than 30 day supply.   Nicholas Turner

## 2011-08-09 NOTE — Telephone Encounter (Signed)
**Note De-Identified Nicholas Turner Obfuscation** Pt. Advised, he verbalized understanding. RX faxed to [phone]./LV

## 2011-09-19 ENCOUNTER — Other Ambulatory Visit: Payer: Self-pay | Admitting: Adult Health

## 2011-09-19 NOTE — Telephone Encounter (Signed)
THIS WAS CALLED IN TO WRONG PHARMACY THEY NEED IT TO GO TO [facility] ORDER [id number] FAX [phone].

## 2011-09-20 ENCOUNTER — Other Ambulatory Visit: Payer: Self-pay | Admitting: Adult Health

## 2011-09-20 NOTE — Telephone Encounter (Signed)
Pt needs this called in to mail order. Per pt wife Samara Deist stated she would give one refill.

## 2011-09-25 ENCOUNTER — Other Ambulatory Visit: Payer: Self-pay | Admitting: Adult Health

## 2011-09-25 ENCOUNTER — Other Ambulatory Visit: Payer: Self-pay | Admitting: *Deleted

## 2011-09-25 MED ORDER — COLCHICINE 0.6 MG PO TABS: 0.6000 mg | ORAL_TABLET | Freq: Two times a day (BID) | ORAL | Status: DC

## 2011-09-25 NOTE — Telephone Encounter (Signed)
PT HAS CALLED THREE TIME FOR THIS TO BE FILLED ( I HAVE TOOK MESSAGE THREE TIME AND EACH TIME IT IS NOT STAYING IN THE SYSTEM. PLEASE CALL IN FOR PT ASAP. KATHYRN TOLD HIM SHE WOULD FILL ONE TIME AT LAST OFFICE VISIT TO GET HIM THROUGH TILL HE GETS A PCP.

## 2011-11-26 ENCOUNTER — Encounter: Payer: Self-pay | Admitting: Internal Medicine

## 2011-11-26 ENCOUNTER — Ambulatory Visit (HOSPITAL_COMMUNITY)
Admission: RE | Admit: 2011-11-26 | Discharge: 2011-11-26 | Disposition: A | Discharge status: Home / Self Care | Payer: Self-pay | Source: Ambulatory Visit | Attending: Internal Medicine | Admitting: Internal Medicine

## 2011-11-26 ENCOUNTER — Other Ambulatory Visit (INDEPENDENT_AMBULATORY_CARE_PROVIDER_SITE_OTHER): Payer: Self-pay

## 2011-11-26 ENCOUNTER — Ambulatory Visit (INDEPENDENT_AMBULATORY_CARE_PROVIDER_SITE_OTHER): Payer: Self-pay | Admitting: Internal Medicine

## 2011-11-26 VITALS — BP 180/124 | HR 103 | Temp 99.7°F | Ht 68.0 in | Wt 319.0 lb

## 2011-11-26 DIAGNOSIS — I10 Essential (primary) hypertension: Secondary | ICD-10-CM

## 2011-11-26 DIAGNOSIS — M25561 Pain in right knee: Secondary | ICD-10-CM

## 2011-11-26 DIAGNOSIS — Z0000 Encounter for general adult medical examination without abnormal findings: Secondary | ICD-10-CM

## 2011-11-26 DIAGNOSIS — M25469 Effusion, unspecified knee: Secondary | ICD-10-CM | POA: Insufficient documentation

## 2011-11-26 DIAGNOSIS — M25579 Pain in unspecified ankle and joints of unspecified foot: Secondary | ICD-10-CM

## 2011-11-26 DIAGNOSIS — M25571 Pain in right ankle and joints of right foot: Secondary | ICD-10-CM

## 2011-11-26 DIAGNOSIS — E785 Hyperlipidemia, unspecified: Secondary | ICD-10-CM | POA: Insufficient documentation

## 2011-11-26 DIAGNOSIS — M109 Gout, unspecified: Secondary | ICD-10-CM

## 2011-11-26 DIAGNOSIS — E119 Type 2 diabetes mellitus without complications: Secondary | ICD-10-CM

## 2011-11-26 DIAGNOSIS — N200 Calculus of kidney: Secondary | ICD-10-CM | POA: Insufficient documentation

## 2011-11-26 DIAGNOSIS — M1990 Unspecified osteoarthritis, unspecified site: Secondary | ICD-10-CM | POA: Insufficient documentation

## 2011-11-26 DIAGNOSIS — M25569 Pain in unspecified knee: Secondary | ICD-10-CM

## 2011-11-26 HISTORY — DX: Gout, unspecified: M10.9

## 2011-11-26 LAB — BASIC METABOLIC PANEL
BUN: 11 mg/dL (ref 6–23)
CO2: 29 mEq/L (ref 19–32)
Calcium: 9.3 mg/dL (ref 8.4–10.5)
Chloride: 103 mEq/L (ref 96–112)
Creatinine, Ser: 0.8 mg/dL (ref 0.4–1.5)
GFR: 139.95 mL/min (ref >60.00)
Glucose, Bld: 206 mg/dL — ABNORMAL HIGH (ref 70–99)
Potassium: 4.3 mEq/L (ref 3.5–5.1)
Sodium: 139 mEq/L (ref 135–145)

## 2011-11-26 LAB — MICROALBUMIN / CREATININE URINE RATIO
Creatinine,U: 71.5 mg/dL
Microalb Creat Ratio: 52.7 mg/g — ABNORMAL HIGH (ref 0.0–30.0)
Microalb, Ur: 37.7 mg/dL — ABNORMAL HIGH (ref 0.0–1.9)

## 2011-11-26 LAB — CBC WITH DIFFERENTIAL/PLATELET
Basophils Absolute: 0 10*3/uL (ref 0.0–0.1)
Basophils Relative: 0.6 % (ref 0.0–3.0)
Eosinophils Absolute: 0.2 10*3/uL (ref 0.0–0.7)
Eosinophils Relative: 3 % (ref 0.0–5.0)
HCT: 41.8 % (ref 39.0–52.0)
Hemoglobin: 13.2 g/dL (ref 13.0–17.0)
Lymphocytes Relative: 27.2 % (ref 12.0–46.0)
Lymphs Abs: 1.6 10*3/uL (ref 0.7–4.0)
MCHC: 31.7 g/dL (ref 30.0–36.0)
MCV: 88.7 fl (ref 78.0–100.0)
Monocytes Absolute: 0.4 10*3/uL (ref 0.1–1.0)
Monocytes Relative: 7 % (ref 3.0–12.0)
Neutro Abs: 3.8 10*3/uL (ref 1.4–7.7)
Neutrophils Relative %: 62.2 % (ref 43.0–77.0)
Platelets: 276 10*3/uL (ref 150.0–400.0)
RBC: 4.71 Mil/uL (ref 4.22–5.81)
RDW: 13.9 % (ref 11.5–14.6)
WBC: 6.1 10*3/uL (ref 4.5–10.5)

## 2011-11-26 LAB — TSH: TSH: 1.18 u[IU]/mL (ref 0.35–5.50)

## 2011-11-26 LAB — HEPATIC FUNCTION PANEL
ALT: 22 U/L (ref 0–53)
AST: 26 U/L (ref 0–37)
Albumin: 3.7 g/dL (ref 3.5–5.2)
Alkaline Phosphatase: 116 U/L (ref 39–117)
Bilirubin, Direct: 0.1 mg/dL (ref 0.0–0.3)
Total Bilirubin: 0.5 mg/dL (ref 0.3–1.2)
Total Protein: 8 g/dL (ref 6.0–8.3)

## 2011-11-26 LAB — PSA: PSA: 0.52 ng/mL (ref 0.10–4.00)

## 2011-11-26 LAB — URINALYSIS, ROUTINE W REFLEX MICROSCOPIC
Bilirubin Urine: NEGATIVE
Hgb urine dipstick: NEGATIVE
Ketones, ur: NEGATIVE
Leukocytes, UA: NEGATIVE
Nitrite: NEGATIVE
Specific Gravity, Urine: 1.02 (ref 1.000–1.030)
Total Protein, Urine: 30
Urine Glucose: 100
Urobilinogen, UA: 0.2 (ref 0.0–1.0)
pH: 7 (ref 5.0–8.0)

## 2011-11-26 LAB — HEMOGLOBIN A1C: Hgb A1c MFr Bld: 7.5 % — ABNORMAL HIGH (ref 4.6–6.5)

## 2011-11-26 LAB — LIPID PANEL
Cholesterol: 261 mg/dL — ABNORMAL HIGH (ref 0–200)
HDL: 45.9 mg/dL (ref >39.00)
Total CHOL/HDL Ratio: 6
Triglycerides: 84 mg/dL (ref 0.0–149.0)
VLDL: 16.8 mg/dL (ref 0.0–40.0)

## 2011-11-26 LAB — SEDIMENTATION RATE: Sed Rate: 73 mm/hr — ABNORMAL HIGH (ref 0–22)

## 2011-11-26 LAB — LDL CHOLESTEROL, DIRECT: Direct LDL: 218.8 mg/dL

## 2011-11-26 MED ORDER — COLCHICINE 0.6 MG PO TABS: 0.6000 mg | ORAL_TABLET | Freq: Two times a day (BID) | ORAL | Status: DC

## 2011-11-26 NOTE — Progress Notes (Signed)
Subjective:    Patient ID: Nicholas Turner, male    DOB: Mar 02, 1970, 41 y.o.   MRN: [id number]  HPI  Here for wellness and f/u;  Overall doing ok;  Pt denies CP, worsening SOB, DOE, wheezing, orthopnea, PND, worsening LE edema, palpitations, dizziness or syncope.  Pt denies neurological change such as new Headache, facial or extremity weakness.  Pt denies polydipsia, polyuria, or low sugar symptoms. Pt states overall good compliance with treatment and medications, good tolerability, and trying to follow lower cholesterol diet.  Pt denies worsening depressive symptoms, suicidal ideation or panic. No fever, wt loss, night sweats, loss of appetite, or other constitutional symptoms.  Pt states good ability with ADL's, low fall risk, home safety reviewed and adequate, no significant changes in hearing or vision, and occasionally active with exercise, really very little recently due to severe OA pain/swelling to right knee and ankle, as well as milder OA to left knee.  Has hx of gout but fortunately no overt flares.  Has been on allopurinol and uloric in the past, but has done the best on colchrys, but needs PT Assist form filled out for [facility] for free med.  Currently has [facility] care, good for about the next 10 mo, but would be ok with attempted referral to ortho as the pain is worse even if out of the [facility] system.  Has very little funds, and is essentially taking no meds in the past few wks, inclduing the BP Past Medical History  Diagnosis Date  . Knee pain     feet   . Gout   . Hypertension   . Diabetes mellitus   . Palpitation   . Generalized headaches   . UTI (lower urinary tract infection)   . DM (diabetes mellitus) 09/01/2010    Diet controlled   . Obesity (BMI 30-39.9) 09/01/2010  . Gout 11/26/2011  . HYPERTENSION, UNCONTROLLED 09/09/2009    Qualifier: Diagnosis of  By: Via LPN, Larita Fife    . SLEEP APNEA 09/09/2009    Qualifier: Diagnosis of  By: Via LPN, Larita Fife    . Arthritis   .  Hyperlipidemia   . Kidney stones    Past Surgical History  Procedure Date  . Kidney stone surgery     post ureteral stent and status post lithotripsy (10-15 yrs Ago)    reports that he has never smoked. He has never used smokeless tobacco. He reports that he does not drink alcohol or use illicit drugs. family history includes Alcohol abuse in his other; Arthritis in his mother and others; Asthma in his other; Cancer in his other; Diabetes in his other; Heart disease in his others; Hypertension in his others; and Sudden death in his other. Allergies  Allergen Reactions  . Clonidine Derivatives Other (See Comments)    Welps  . Oxycodone Nausea And Vomiting   Current Outpatient Prescriptions on File Prior to Visit  Medication Sig Dispense Refill  . acetaminophen (TYLENOL) 500 MG tablet Take 1,000 mg by mouth every 6 (six) hours as needed. Pain       . amLODipine (NORVASC) 10 MG tablet TAKE ONE TABLET BY MOUTH EVERY DAY  30 tablet  6  . colchicine 0.6 MG tablet Take 1 tablet (0.6 mg total) by mouth 2 (two) times daily.  60 tablet  0  . doxazosin (CARDURA) 8 MG tablet TAKE ONE TABLET BY MOUTH EVERY DAY  30 tablet  6  . hydrALAZINE (APRESOLINE) 25 MG tablet TAKE ONE TABLET BY MOUTH  TWICE DAILY  60 tablet  6  . hydrochlorothiazide (HYDRODIURIL) 25 MG tablet Take 1 tablet (25 mg total) by mouth daily.  30 tablet  6  . isosorbide mononitrate (IMDUR) 30 MG 24 hr tablet TAKE ONE TABLET BY MOUTH EVERY DAY  30 tablet  6  . lisinopril (PRINIVIL,ZESTRIL) 20 MG tablet TAKE ONE TABLET BY MOUTH TWICE DAILY  60 tablet  6  . metoprolol (LOPRESSOR) 100 MG tablet Take 1 tablet (100 mg total) by mouth 2 (two) times daily. Pt. Is overdue for OV and needs to schedule  180 tablet  3  . pravastatin (PRAVACHOL) 20 MG tablet TAKE ONE TABLET BY MOUTH EVERY DAY  30 tablet  6  . [DISCONTINUED] metFORMIN (GLUCOPHAGE) 500 MG tablet Take 1 tablet (500 mg total) by mouth 2 (two) times daily with a meal.  60 tablet  0    Review of Systems Review of Systems  Constitutional: Negative for diaphoresis, activity change, appetite change and unexpected weight change.  HENT: Negative for hearing loss, ear pain, facial swelling, mouth sores and neck stiffness.   Eyes: Negative for pain, redness and visual disturbance.  Respiratory: Negative for shortness of breath and wheezing.   Cardiovascular: Negative for chest pain and palpitations.  Gastrointestinal: Negative for diarrhea, blood in stool, abdominal distention and rectal pain.  Genitourinary: Negative for hematuria, flank pain and decreased urine volume.  Musculoskeletal: Negative for myalgias and joint swelling.  Skin: Negative for color change and wound.  Neurological: Negative for syncope and numbness.  Hematological: Negative for adenopathy.  Psychiatric/Behavioral: Negative for hallucinations, self-injury, decreased concentration and agitation.      Objective:   Physical Exam BP 180/124  Pulse 103  Temp 99.7 F (37.6 C) (Oral)  Ht 5\' 8"  (1.727 m)  Wt 319 lb (144.697 kg)  BMI 48.50 kg/m2  SpO2 97% Physical Exam  VS noted Constitutional: Pt is oriented to person, place, and time. Appears well-developed and well-nourished.  HENT:  Head: Normocephalic and atraumatic.  Right Ear: External ear normal.  Left Ear: External ear normal.  Nose: Nose normal.  Mouth/Throat: Oropharynx is clear and moist.  Eyes: Conjunctivae and EOM are normal. Pupils are equal, round, and reactive to light.  Neck: Normal range of motion. Neck supple. No JVD present. No tracheal deviation present.  Cardiovascular: Normal rate, regular rhythm, normal heart sounds and intact distal pulses.   Pulmonary/Chest: Effort normal and breath sounds normal.  Abdominal: Soft. Bowel sounds are normal. There is no tenderness.  Musculoskeletal: Normal range of motion. Exhibits no edema.  Lymphadenopathy:  Has no cervical adenopathy.  Neurological: Pt is alert and oriented to person,  place, and time. Pt has normal reflexes. No cranial nerve deficit.  Skin: Skin is warm and dry. No rash noted.  Psychiatric:  Has  normal mood and affect. Behavior is normal.  Right knee with severe crepitus, mild to mod effusion, decreased ROM Right ankle with mild tender, effusion and decreased ROM Left knee with mild to mod crepitus, no effusion, but somewhat reduced ROM    Assessment & Plan:

## 2011-11-26 NOTE — Assessment & Plan Note (Signed)
prob severe OA , unsusual for his age, for films today, refer ortho

## 2011-11-26 NOTE — Assessment & Plan Note (Signed)
prob severe OA , unsusual for his age, for films today, refer ortho  

## 2011-11-26 NOTE — Assessment & Plan Note (Signed)
Severe, asympt, gave mult samples of azor to help in liue of the amlodipine and lisinopril, but to cont all meds as he o/w is able

## 2011-11-26 NOTE — Assessment & Plan Note (Signed)
Suspect Not active at this time, to re-start the colchryx, form filled out

## 2011-11-26 NOTE — Assessment & Plan Note (Signed)
stable overall by hx and exam, most recent data reviewed with pt, and pt to continue medical treatment as before Lab Results  Component Value Date   HGBA1C 6.2* 09/01/2010

## 2011-11-26 NOTE — Patient Instructions (Addendum)
Your form and prescription for colchrys was done today Continue all other medications as before, as they are the least expensive there is at this time You are given the samples of Azor 10/40 to take as well for Blood Pressure (would take the place of both the amlodipine and lisinopril on your medication list as long as the samples last) Please have the pharmacy call with any refills you may need. Please go to XRAY in the Basement for the x-ray test Please go to LAB in the Basement for the blood and/or urine tests to be done today You will be contacted by phone if any changes need to be made immediately.  Otherwise, you will receive a letter about your results with an explanation. Please remember to sign up for My Chart at your earliest convenience, as this will be important to you in the future with finding out test results. You will be contacted regarding the referral for: orthopedic (Dr Victorino Dike) Please return in 6 mo with Lab testing done 3-5 days before

## 2011-11-26 NOTE — Assessment & Plan Note (Signed)

## 2011-11-28 ENCOUNTER — Telehealth: Payer: Self-pay

## 2011-11-28 NOTE — Telephone Encounter (Signed)
Looks like I was too fast, he should be receiving a letter as he was not signed up for mychart at the time I addressed the labs;  All ok

## 2011-11-28 NOTE — Telephone Encounter (Signed)
Patient would like his lab results.  He has checked on My Chart but not there.

## 2011-11-29 ENCOUNTER — Telehealth: Payer: Self-pay | Admitting: Internal Medicine

## 2011-11-29 MED ORDER — ATORVASTATIN CALCIUM 40 MG PO TABS: 40.0000 mg | ORAL_TABLET | Freq: Every day | ORAL | Status: DC

## 2011-11-29 MED ORDER — METFORMIN HCL 500 MG PO TABS: 500.0000 mg | ORAL_TABLET | Freq: Every day | ORAL | Status: DC

## 2011-11-29 NOTE — Telephone Encounter (Signed)
Patient informed. 

## 2011-11-29 NOTE — Telephone Encounter (Signed)
Left message on mychart, cont same tx, except:  I apologize for the lateness of this report; I thought a letter had been sent when it was not;  These results are "OK" except the A1c is too elevated, and the LDL cholesterol is severely uncontrolled.  The sedimentation rate is improved.  The test results show that your current treatment is OK., except we need to start a low dose medication called metformin for Diabetes, and we should change the Pravachol to Lipitor for better control of cholesterol (the pravachol will not work at this level).    There is no other need for change of treatment or further evaluation based on these results which are otherwise normal or stable.  Please continue the same plan for further evaluation and treatment as discussed at your office visit.   Robin to inform pt, I will do rx's x 2

## 2011-11-30 NOTE — Telephone Encounter (Signed)
Called informed the patient of results and new medication to start.  The patient agreed and understood instructions.

## 2012-02-25 ENCOUNTER — Telehealth: Payer: Self-pay | Admitting: Internal Medicine

## 2012-02-25 NOTE — Telephone Encounter (Signed)
Ok for glucometer - to robin to handle (may need to specify name of glucometer)

## 2012-02-25 NOTE — Telephone Encounter (Signed)
Patient has a voucher for a free diabetic meter but he needs a script from his doctor, wants to know if we can send it to his pharmacy or if needs to have an OV first

## 2012-02-26 MED ORDER — ACCU-CHEK NANO SMARTVIEW W/DEVICE KIT: 1.0000 | PACK | Freq: Two times a day (BID) | Status: DC

## 2012-02-26 NOTE — Telephone Encounter (Signed)
Called left message to call back 

## 2012-02-26 NOTE — Telephone Encounter (Signed)
He needs a Facilities manager

## 2012-03-08 ENCOUNTER — Other Ambulatory Visit: Payer: Self-pay

## 2012-03-10 ENCOUNTER — Encounter (HOSPITAL_COMMUNITY): Payer: Self-pay | Admitting: *Deleted

## 2012-03-10 ENCOUNTER — Emergency Department (HOSPITAL_COMMUNITY)
Admission: EM | Admit: 2012-03-10 | Discharge: 2012-03-10 | Disposition: A | Discharge status: Home / Self Care | Payer: Self-pay | Attending: Emergency Medicine | Admitting: Emergency Medicine

## 2012-03-10 ENCOUNTER — Emergency Department (HOSPITAL_COMMUNITY): Payer: Self-pay

## 2012-03-10 DIAGNOSIS — E669 Obesity, unspecified: Secondary | ICD-10-CM | POA: Insufficient documentation

## 2012-03-10 DIAGNOSIS — E119 Type 2 diabetes mellitus without complications: Secondary | ICD-10-CM | POA: Insufficient documentation

## 2012-03-10 DIAGNOSIS — Z87440 Personal history of urinary (tract) infections: Secondary | ICD-10-CM | POA: Insufficient documentation

## 2012-03-10 DIAGNOSIS — Z79899 Other long term (current) drug therapy: Secondary | ICD-10-CM | POA: Insufficient documentation

## 2012-03-10 DIAGNOSIS — E785 Hyperlipidemia, unspecified: Secondary | ICD-10-CM | POA: Insufficient documentation

## 2012-03-10 DIAGNOSIS — S139XXA Sprain of joints and ligaments of unspecified parts of neck, initial encounter: Secondary | ICD-10-CM | POA: Insufficient documentation

## 2012-03-10 DIAGNOSIS — T148XXA Other injury of unspecified body region, initial encounter: Secondary | ICD-10-CM

## 2012-03-10 DIAGNOSIS — Y939 Activity, unspecified: Secondary | ICD-10-CM | POA: Insufficient documentation

## 2012-03-10 DIAGNOSIS — Z8739 Personal history of other diseases of the musculoskeletal system and connective tissue: Secondary | ICD-10-CM | POA: Insufficient documentation

## 2012-03-10 DIAGNOSIS — Z87442 Personal history of urinary calculi: Secondary | ICD-10-CM | POA: Insufficient documentation

## 2012-03-10 DIAGNOSIS — X58XXXA Exposure to other specified factors, initial encounter: Secondary | ICD-10-CM | POA: Insufficient documentation

## 2012-03-10 DIAGNOSIS — Y929 Unspecified place or not applicable: Secondary | ICD-10-CM | POA: Insufficient documentation

## 2012-03-10 DIAGNOSIS — M542 Cervicalgia: Secondary | ICD-10-CM

## 2012-03-10 DIAGNOSIS — I10 Essential (primary) hypertension: Secondary | ICD-10-CM | POA: Insufficient documentation

## 2012-03-10 DIAGNOSIS — G8929 Other chronic pain: Secondary | ICD-10-CM | POA: Insufficient documentation

## 2012-03-10 DIAGNOSIS — Z6830 Body mass index (BMI) 30.0-30.9, adult: Secondary | ICD-10-CM | POA: Insufficient documentation

## 2012-03-10 DIAGNOSIS — M109 Gout, unspecified: Secondary | ICD-10-CM | POA: Insufficient documentation

## 2012-03-10 HISTORY — DX: Other chronic pain: G89.29

## 2012-03-10 HISTORY — DX: Cervicalgia: M54.2

## 2012-03-10 MED ORDER — HYDROCODONE-ACETAMINOPHEN 5-325 MG PO TABS: ORAL_TABLET | ORAL | Status: DC

## 2012-03-10 MED ORDER — HYDROCODONE-ACETAMINOPHEN 5-325 MG PO TABS
2.0000 | ORAL_TABLET | Freq: Once | ORAL | Status: AC
  Administered 2012-03-10: 2 via ORAL
  Filled 2012-03-10: qty 2

## 2012-03-10 MED ORDER — METHOCARBAMOL 500 MG PO TABS: 1000.0000 mg | ORAL_TABLET | Freq: Four times a day (QID) | ORAL | Status: DC | PRN

## 2012-03-10 MED ORDER — DIAZEPAM 5 MG PO TABS
5.0000 mg | ORAL_TABLET | Freq: Once | ORAL | Status: AC
  Administered 2012-03-10: 5 mg via ORAL
  Filled 2012-03-10: qty 1

## 2012-03-10 NOTE — ED Provider Notes (Signed)
History     CSN: [id number]  Arrival date & time 03/10/12  1409   First MD Initiated Contact with Patient 03/10/12 1449      Chief Complaint  Patient presents with  . Neck Pain     HPI Pt was seen at 1510.   Per pt, c/o gradual onset and persistence of constant acute flair of his chronic left sided neck "pain" for the past several years, worse over the past several days.  Pt states the pain radiates into his left shoulder.  Pain worsens with palpation of the area and body position changes. States he did not tell his PMD of these symptoms during his last appointment approx 3 months ago.  Denies any change in his usual chronic pain pattern.  Denies injury, no CP/palpitations, no SOB/cough, no abd pain, no N/V/D, no headache, no visual changes, no focal motor weakness, no tingling/numbness in extremities.      Past Medical History  Diagnosis Date  . Knee pain     feet   . Gout   . Hypertension   . Diabetes mellitus   . Palpitation   . Generalized headaches   . UTI (lower urinary tract infection)   . DM (diabetes mellitus) 09/01/2010    Diet controlled   . Obesity (BMI 30-39.9) 09/01/2010  . Gout 11/26/2011  . HYPERTENSION, UNCONTROLLED 09/09/2009    Qualifier: Diagnosis of  By: Via LPN, Larita Fife    . SLEEP APNEA 09/09/2009    Qualifier: Diagnosis of  By: Via LPN, Larita Fife    . Arthritis   . Hyperlipidemia   . Kidney stones   . Chronic neck pain     Past Surgical History  Procedure Laterality Date  . Kidney stone surgery      post ureteral stent and status post lithotripsy (10-15 yrs Ago)    Family History  Problem Relation Age of Onset  . Arthritis Mother   . Arthritis Other   . Heart disease Other   . Hypertension Other   . Sudden death Other   . Arthritis Other   . Heart disease Other   . Hypertension Other   . Alcohol abuse Other   . Cancer Other   . Asthma Other     breast cancer  . Heart disease Other   . Hypertension Other   . Diabetes Other     History   Substance Use Topics  . Smoking status: Never Smoker   . Smokeless tobacco: Never Used  . Alcohol Use: No      Review of Systems ROS: Statement: All systems negative except as marked or noted in the HPI; Constitutional: Negative for fever and chills. ; ; Eyes: Negative for eye pain, redness and discharge. ; ; ENMT: Negative for ear pain, hoarseness, nasal congestion, sinus pressure and sore throat. ; ; Cardiovascular: Negative for chest pain, palpitations, diaphoresis, dyspnea and peripheral edema. ; ; Respiratory: Negative for cough, wheezing and stridor. ; ; Gastrointestinal: Negative for nausea, vomiting, diarrhea, abdominal pain, blood in stool, hematemesis, jaundice and rectal bleeding. . ; ; Genitourinary: Negative for dysuria, flank pain and hematuria. ; ; Musculoskeletal: +left sided neck pain. Negative for back pain. Negative for swelling and trauma.; ; Skin: Negative for pruritus, rash, abrasions, blisters, bruising and skin lesion.; ; Neuro: Negative for headache, lightheadedness and neck stiffness. Negative for weakness, altered level of consciousness , altered mental status, extremity weakness, paresthesias, involuntary movement, seizure and syncope.       Allergies  Clonidine  derivatives and Oxycodone  Home Medications   Current Outpatient Rx  Name  Route  Sig  Dispense  Refill  . colchicine (COLCRYS) 0.6 MG tablet   Oral   Take 1 tablet (0.6 mg total) by mouth 2 (two) times daily.   180 tablet   3   . ibuprofen (ADVIL,MOTRIN) 200 MG tablet   Oral   Take 800 mg by mouth every 6 (six) hours as needed for pain.         Marland Kitchen amLODipine (NORVASC) 10 MG tablet      TAKE ONE TABLET BY MOUTH EVERY DAY   30 tablet   6   . doxazosin (CARDURA) 8 MG tablet      TAKE ONE TABLET BY MOUTH EVERY DAY   30 tablet   6   . hydrALAZINE (APRESOLINE) 25 MG tablet      TAKE ONE TABLET BY MOUTH TWICE DAILY   60 tablet   6   . hydrochlorothiazide (HYDRODIURIL) 25 MG tablet    Oral   Take 1 tablet (25 mg total) by mouth daily.   30 tablet   6   . HYDROcodone-acetaminophen (NORCO/VICODIN) 5-325 MG per tablet      1 or 2 tabs PO q6 hours prn pain   25 tablet   0   . isosorbide mononitrate (IMDUR) 30 MG 24 hr tablet      TAKE ONE TABLET BY MOUTH EVERY DAY   30 tablet   6   . lisinopril (PRINIVIL,ZESTRIL) 20 MG tablet      TAKE ONE TABLET BY MOUTH TWICE DAILY   60 tablet   6   . metFORMIN (GLUCOPHAGE) 500 MG tablet   Oral   Take 1 tablet (500 mg total) by mouth daily with breakfast.   90 tablet   3   . methocarbamol (ROBAXIN) 500 MG tablet   Oral   Take 2 tablets (1,000 mg total) by mouth 4 (four) times daily as needed (muscle spasm/pain).   25 tablet   0   . metoprolol (LOPRESSOR) 100 MG tablet   Oral   Take 1 tablet (100 mg total) by mouth 2 (two) times daily. Pt. Is overdue for OV and needs to schedule   180 tablet   3     Pt. Is overdue for OV and needs to schedule     BP 202/133  Pulse 90  Temp(Src) 98.1 F (36.7 C) (Oral)  Resp 18  Ht 5\' 9"  (1.753 m)  Wt 323 lb (146.512 kg)  BMI 47.68 kg/m2  SpO2 94%  Physical Exam 1515: Physical examination:  Nursing notes reviewed; Vital signs and O2 SAT reviewed;  Constitutional: Well developed, Well nourished, Well hydrated, In no acute distress; Head:  Normocephalic, atraumatic; Eyes: EOMI, PERRL, No scleral icterus; ENMT: Mouth and pharynx normal, Mucous membranes moist; Neck: Supple, Full range of motion, No lymphadenopathy; Cardiovascular: Regular rate and rhythm, No murmur, rub, or gallop; Respiratory: Breath sounds clear & equal bilaterally, No rales, rhonchi, wheezes.  Speaking full sentences with ease, Normal respiratory effort/excursion; Chest: Nontender, Movement normal; Abdomen: Soft, Nontender, Nondistended, Normal bowel sounds; Spine:  No midline CS, TS, LS tenderness.  +TTP left cervical paraspinal and left trapezius muscles which reproduces pt's pain;;  Extremities: Pulses  normal, +left shoulder w/FROM.  +generalized TTP without specific area of point tenderness. Left clavicle NT, scapula NT, proximal humerus NT, biceps tendon NT over bicipital groove.  Motor strength at shoulder normal.  Sensation intact over deltoid region,  distal NMS intact with left hand having intact and equal sensation and strength in the distribution of the median, radial, and ulnar nerve function to opposite side.  Strong left radial pulse.  +FROM left elbow with intact motor strength biceps and triceps muscles to resistance.  NT left elbow/wrist/hand.  No deformity, No edema, No calf edema or asymmetry.; Neuro: AA&Ox3, Major CN grossly intact.  Speech clear. Grips equal, strength 5/5 equal bilat UE's. No gross focal motor or sensory deficits in extremities.; Skin: Color normal, Warm, Dry.   ED Course  Procedures   1600:  Pt's elevated BP noted: pt and his wife state he does not take his meds because he "needs the money to take care of the house," does not want to fill his rx/take his rx, and does not feel it is important.  EPIC chart reviewed:  Pt was eval by his PMD in 11/2011 with notation made that he is on [facility] "for the next 10 months."  ED RN to remind pt and family of same.  Pt strongly encouraged by myself and ED RN to fill his rx and take them as prescribed.  Verb understanding.   1745:  No acute findings on CT/XR.  Neuro exam intact and unchanged. Continues to deny CP, SOB, abd pain. No hx of aortic pathology on multiple previous CT, XR and [place].  Pain improved after meds and he wants to go home now.  Again encouraged by myself and ED RN the importance of taking his BP meds regularly and to take his BP meds when he gets home. Pt and wife verb understanding; just want to leave now. Dx and testing d/w pt and family.  Questions answered.  Verb understanding, agreeable to d/c home with outpt f/u.   MDM  MDM Reviewed: nursing note, vitals and previous chart Interpretation: CT  scan    Ct Cervical Spine Wo Contrast 03/10/2012  *RADIOLOGY REPORT*  Clinical Data: Left neck pain  CT CERVICAL SPINE WITHOUT CONTRAST  Technique:  Multidetector CT imaging of the cervical spine was performed. Multiplanar CT image reconstructions were also generated.  Comparison: None.  Findings: Normal alignment without fracture, compression deformity, or focal kyphosis.  Facets are aligned.  No subluxation or dislocation.  Normal prevertebral soft tissues.  No significant degenerative process or spondylosis.  Neural foramina appear patent bilaterally.  Lung apices clear.  IMPRESSION: No acute fracture or malalignment by CT.   Original Report Authenticated By: Judie Petit. Miles Costain, M.D.    Dg Shoulder Left 03/10/2012  *RADIOLOGY REPORT*  Clinical Data: Left-sided shoulder pain.  LEFT SHOULDER - 2+ VIEW  Comparison: No priors.  Findings: Three views of the left shoulder demonstrate no acute displaced fracture, subluxation, dislocation, joint or soft tissue abnormality.  IMPRESSION: 1.  No acute radiographic abnormality of the left shoulder.   Original Report Authenticated By: Trudie Reed, M.D.              Laray Anger, DO 03/13/12 1236

## 2012-03-10 NOTE — ED Notes (Signed)
Pain lt side of neck extending into shoulder, No known injury

## 2012-03-10 NOTE — ED Notes (Signed)
Patient admits to not taking his BP meds for several months.  Wife is only one working and he feels it is more important to maintain their home than pay for medications.  Counseled patient on importance of caring for himself and potential costs associated with sequelae of HTN.

## 2012-03-10 NOTE — ED Notes (Signed)
Pain starting in cervical spine, radiating across upper back to L shoulder.  Has had similar pain on occasion before, but never this severe. Has been taking Ibuprofen at home with minimal relief.  Full neck ROM.  Some limitation of L shoulder on internal rotation.  Noticeable loss of L trapezius muscle mass.  C/O occasional numbness of hands bilaterally.  Patient fell down 1 step onto concrete floor, landing on coccyx 2 years ago, but was never evaluated.

## 2012-03-17 ENCOUNTER — Other Ambulatory Visit: Payer: Self-pay

## 2012-03-17 MED ORDER — METFORMIN HCL 500 MG PO TABS: 500.0000 mg | ORAL_TABLET | Freq: Every day | ORAL | Status: DC

## 2012-03-31 ENCOUNTER — Ambulatory Visit (INDEPENDENT_AMBULATORY_CARE_PROVIDER_SITE_OTHER): Payer: Self-pay | Admitting: Adult Health

## 2012-03-31 ENCOUNTER — Encounter: Payer: Self-pay | Admitting: Adult Health

## 2012-03-31 VITALS — BP 120/74 | HR 79 | Ht 69.0 in | Wt 324.1 lb

## 2012-03-31 DIAGNOSIS — E119 Type 2 diabetes mellitus without complications: Secondary | ICD-10-CM

## 2012-03-31 DIAGNOSIS — I10 Essential (primary) hypertension: Secondary | ICD-10-CM

## 2012-03-31 DIAGNOSIS — R110 Nausea: Secondary | ICD-10-CM

## 2012-03-31 MED ORDER — METOCLOPRAMIDE HCL 10 MG PO TABS: ORAL_TABLET | ORAL | Status: DC

## 2012-03-31 NOTE — Patient Instructions (Addendum)
Your physician recommends that you schedule a follow-up appointment in: 6 months with [facility]  Your physician recommends that you return for lab work in:  This week (slips given) HGB A1C, AMYLACE, CMET   A REFERRAL WAS PLACED FOR [facility] MD, SOMEONE WILL CALL YOU WITH THAT APPOINTMENT DATE/TIME  Your physician has recommended you make the following change in your medication:   1) START REGLAN 10MG  (TAKE 30 MINUTES PRIOR TO EACH MEAL)

## 2012-03-31 NOTE — Assessment & Plan Note (Signed)
He is unhappy with how his blood glucose has been running despite use of metformin. We will defer to Dr. Jonny Ruiz for ongoing treatment. Hemoglobin A1c will be added to blood draw. Results to Dr. Jonny Ruiz.

## 2012-03-31 NOTE — Assessment & Plan Note (Signed)
He may be experiencing evidence of gastroparesis. In setting of diabetes this is likely. I will refer him to our GI for further evaluation and plans for treatment options vs. diagnostic testing. In the remote start him on Reglan 10 mg by mouth 30 minutes prior to each meal for symptomatic relief. We will check LFTs.

## 2012-03-31 NOTE — Progress Notes (Signed)
HPI: [name] is a morbidly obese patient of Dr. Dietrich Pates we are following for ongoing assessment and management of hypertension, with history of sleep apnea, hypercholesterolemia, and diabetes. He comes today without any cardiac complaints. His main complaint now is chronic constant low-grade nausea. He has lost approximately 12 pounds since being seen last, with lack of appetite. He states that he feels nauseous all the time, has never actually thrown up but has felt as if he would several times. He has also noticed that his blood glucoses not being very well-controlled on current medication regimen with metformin 500 mg daily. He is followed by Dr. Oliver Barre who is managing his diabetes. He states that his blood glucoses gauntness high as 400 the average is in the high 200s and most 300 stat home. He is medically compliant. He has never been seen by a GI physician. HIDA scan was completed in the past which was negative for biliary dyskinesia.   Allergies  Allergen Reactions  . Clonidine Derivatives Other (See Comments)    Welps  . Oxycodone Nausea And Vomiting    Current Outpatient Prescriptions  Medication Sig Dispense Refill  . amLODipine-olmesartan (AZOR) 10-40 MG per tablet Take 1 tablet by mouth daily.      . colchicine (COLCRYS) 0.6 MG tablet Take 1 tablet (0.6 mg total) by mouth 2 (two) times daily.  180 tablet  3  . doxazosin (CARDURA) 8 MG tablet TAKE ONE TABLET BY MOUTH EVERY DAY  30 tablet  6  . hydrALAZINE (APRESOLINE) 25 MG tablet TAKE ONE TABLET BY MOUTH TWICE DAILY  60 tablet  6  . hydrochlorothiazide (HYDRODIURIL) 25 MG tablet Take 1 tablet (25 mg total) by mouth daily.  30 tablet  6  . HYDROcodone-acetaminophen (NORCO/VICODIN) 5-325 MG per tablet 1 or 2 tabs PO q6 hours prn pain  25 tablet  0  . ibuprofen (ADVIL,MOTRIN) 200 MG tablet Take 800 mg by mouth every 6 (six) hours as needed for pain.      . isosorbide mononitrate (IMDUR) 30 MG 24 hr tablet TAKE ONE TABLET BY  MOUTH EVERY DAY  30 tablet  6  . metFORMIN (GLUCOPHAGE) 500 MG tablet Take 1 tablet (500 mg total) by mouth daily with breakfast.  90 tablet  3  . methocarbamol (ROBAXIN) 500 MG tablet Take 2 tablets (1,000 mg total) by mouth 4 (four) times daily as needed (muscle spasm/pain).  25 tablet  0  . metoprolol (LOPRESSOR) 100 MG tablet Take 1 tablet (100 mg total) by mouth 2 (two) times daily. Pt. Is overdue for OV and needs to schedule  180 tablet  3  . pravastatin (PRAVACHOL) 40 MG tablet Take 40 mg by mouth daily.       No current facility-administered medications for this visit.    Past Medical History  Diagnosis Date  . Knee pain     feet   . Gout   . Hypertension   . Diabetes mellitus   . Palpitation   . Generalized headaches   . UTI (lower urinary tract infection)   . DM (diabetes mellitus) 09/01/2010    Diet controlled   . Obesity (BMI 30-39.9) 09/01/2010  . Gout 11/26/2011  . HYPERTENSION, UNCONTROLLED 09/09/2009    Qualifier: Diagnosis of  By: Via LPN, Larita Fife    . SLEEP APNEA 09/09/2009    Qualifier: Diagnosis of  By: Via LPN, Larita Fife    . Arthritis   . Hyperlipidemia   . Kidney stones   .  Chronic neck pain     Past Surgical History  Procedure Laterality Date  . Kidney stone surgery      post ureteral stent and status post lithotripsy (10-15 yrs Ago)    ROS: PHYSICAL EXAM BP 120/74  Pulse 79  Ht 5\' 9"  (1.753 m)  Wt 324 lb 1.9 oz (147.02 kg)  BMI 47.84 kg/m2  General: Well developed, well nourished, in no acute distress, morbidly obese. Head: Eyes PERRLA, No xanthomas.   Normal cephalic and atramatic  Lungs: Clear bilaterally to auscultation and percussion. Heart: HRRR S1 S2, without MRG.  Pulses are 2+ & equal.            No carotid bruit. No JVD.  No abdominal bruits. No femoral bruits. Abdomen: Bowel sounds are positive, abdomen soft and non-tender without masses or                  Hernia's noted. Msk:  Back normal, normal gait. Normal strength and tone for  age. Extremities: No clubbing, cyanosis or edema.  DP +1 Neuro: Alert and oriented X 3. Psych:  Good affect, responds appropriately  EKG: NSR rate of 79 bpm. Mild LVH  ASSESSMENT AND PLAN

## 2012-03-31 NOTE — Assessment & Plan Note (Signed)
Blood pressure is very well-controlled on her current medication regimen. Will not make any changes at this time. It is at an optimal level for a diabetic patient. I will check a BMET to evaluate kidney function in the setting of use of hydrochlorothiazide and Advil

## 2012-04-01 LAB — COMPREHENSIVE METABOLIC PANEL
ALT: 91 U/L — ABNORMAL HIGH (ref 0–53)
AST: 64 U/L — ABNORMAL HIGH (ref 0–37)
Albumin: 4.5 g/dL (ref 3.5–5.2)
Alkaline Phosphatase: 161 U/L — ABNORMAL HIGH (ref 39–117)
BUN: 15 mg/dL (ref 6–23)
CO2: 27 mEq/L (ref 19–32)
Calcium: 9.4 mg/dL (ref 8.4–10.5)
Chloride: 101 mEq/L (ref 96–112)
Creat: 0.83 mg/dL (ref 0.50–1.35)
Glucose, Bld: 245 mg/dL — ABNORMAL HIGH (ref 70–99)
Potassium: 3.9 mEq/L (ref 3.5–5.3)
Sodium: 139 mEq/L (ref 135–145)
Total Bilirubin: 0.7 mg/dL (ref 0.3–1.2)
Total Protein: 7.7 g/dL (ref 6.0–8.3)

## 2012-04-01 LAB — HEMOGLOBIN A1C
Hgb A1c MFr Bld: 9.6 % — ABNORMAL HIGH (ref <5.7)
Mean Plasma Glucose: 229 mg/dL — ABNORMAL HIGH (ref <117)

## 2012-04-01 LAB — AMYLASE: Amylase: 30 U/L (ref 0–105)

## 2012-04-02 ENCOUNTER — Telehealth: Payer: Self-pay

## 2012-04-02 DIAGNOSIS — IMO0001 Reserved for inherently not codable concepts without codable children: Secondary | ICD-10-CM

## 2012-04-02 NOTE — Telephone Encounter (Signed)
Called the patient informed of all MD instructions.  The patient would like to wait on DM education at this time.  The patient understood and agreed to all instructions.

## 2012-04-02 NOTE — Telephone Encounter (Signed)
Pt called stating that although he was started on Metformin to treat DM the lowest his fasting CBG has been was 294. Pt is requesting MD advisement

## 2012-04-02 NOTE — Telephone Encounter (Signed)
Recent a1c > 9; ok to increase the metformin to 500 bid (new rx done), and ROV may 2014 as planned with labs prior if able  I can also refer to Dm education if pt would like intensive education about the diet and activity  Cont to monitor CBG's and call if still > 200 in 1-2 wks

## 2012-04-03 NOTE — Progress Notes (Signed)
Quick Note:  Tana Coast is on maternity leave. From looking at result note from Harrold Donath, appears patient was referred to [facility] GI? We are with [facility]. Please clarify:)  ______

## 2012-04-08 ENCOUNTER — Other Ambulatory Visit: Payer: Self-pay | Admitting: Adult Health

## 2012-04-10 ENCOUNTER — Ambulatory Visit (INDEPENDENT_AMBULATORY_CARE_PROVIDER_SITE_OTHER): Payer: Self-pay | Admitting: Internal Medicine

## 2012-04-10 ENCOUNTER — Encounter: Payer: Self-pay | Admitting: Internal Medicine

## 2012-04-10 VITALS — BP 134/80 | HR 84 | Temp 98.0°F | Ht 69.0 in | Wt 332.2 lb

## 2012-04-10 DIAGNOSIS — E785 Hyperlipidemia, unspecified: Secondary | ICD-10-CM

## 2012-04-10 DIAGNOSIS — K769 Liver disease, unspecified: Secondary | ICD-10-CM

## 2012-04-10 DIAGNOSIS — K921 Melena: Secondary | ICD-10-CM

## 2012-04-10 DIAGNOSIS — R945 Abnormal results of liver function studies: Secondary | ICD-10-CM

## 2012-04-10 DIAGNOSIS — I10 Essential (primary) hypertension: Secondary | ICD-10-CM

## 2012-04-10 DIAGNOSIS — R110 Nausea: Secondary | ICD-10-CM

## 2012-04-10 DIAGNOSIS — E119 Type 2 diabetes mellitus without complications: Secondary | ICD-10-CM

## 2012-04-10 MED ORDER — METFORMIN HCL 500 MG PO TABS: 1000.0000 mg | ORAL_TABLET | Freq: Two times a day (BID) | ORAL | Status: DC

## 2012-04-10 MED ORDER — GLIPIZIDE ER 10 MG PO TB24: 10.0000 mg | ORAL_TABLET | Freq: Every day | ORAL | Status: DC

## 2012-04-10 NOTE — Assessment & Plan Note (Signed)
stable overall by history and exam, recent data reviewed with pt, and pt to continue medical treatment as before,  to f/u any worsening symptoms or concerns BP Readings from Last 3 Encounters:  04/10/12 134/80  03/31/12 120/74  03/10/12 202/133

## 2012-04-10 NOTE — Progress Notes (Signed)
Subjective:    Patient ID: Nicholas Turner, male    DOB: 03-Apr-1970, 42 y.o.   MRN: [id number]  HPI  Here to f/u; overall doing ok,  Pt denies chest pain, increased sob or doe, wheezing, orthopnea, PND, increased LE swelling, palpitations, dizziness or syncope.  Pt denies polydipsia, polyuria, or low sugar symptoms such as weakness or confusion improved with po intake.  Pt denies new neurological symptoms such as new headache, or facial or extremity weakness or numbness.   Pt states overall good compliance with meds, has been trying to follow lower cholesterol, diabetic diet, with wt overall stable,  but little exercise however, in fact with much less activity overall and gained 13 lbs in 4 mo.  States all activity seems to make him nauseas.  Also with recent mild elev LFT's, and mentions 2 episodes limited BRBPR in the past 3 days with out pain, Has been referred to GI, with appt mar 31 per pt.  Needs refill azor.  Recent lab with marked a1c increased >9 after just over 7 approx 4 mo ago.  Has not seen DM education to date.  Also mentions mild left shoulder pain but FROM, delcines further attention today Past Medical History  Diagnosis Date  . Knee pain     feet   . Gout   . Hypertension   . Diabetes mellitus   . Palpitation   . Generalized headaches   . UTI (lower urinary tract infection)   . DM (diabetes mellitus) 09/01/2010    Diet controlled   . Obesity (BMI 30-39.9) 09/01/2010  . Gout 11/26/2011  . HYPERTENSION, UNCONTROLLED 09/09/2009    Qualifier: Diagnosis of  By: Via LPN, Larita Fife    . SLEEP APNEA 09/09/2009    Qualifier: Diagnosis of  By: Via LPN, Larita Fife    . Arthritis   . Hyperlipidemia   . Kidney stones   . Chronic neck pain    Past Surgical History  Procedure Laterality Date  . Kidney stone surgery      post ureteral stent and status post lithotripsy (10-15 yrs Ago)    reports that he has never smoked. He has never used smokeless tobacco. He reports that he does not drink  alcohol or use illicit drugs. family history includes Alcohol abuse in his other; Arthritis in his mother and others; Asthma in his other; Cancer in his other; Diabetes in his other; Heart disease in his others; Hypertension in his others; and Sudden death in his other. Allergies  Allergen Reactions  . Clonidine Derivatives Other (See Comments)    Welps  . Oxycodone Nausea And Vomiting   Current Outpatient Prescriptions on File Prior to Visit  Medication Sig Dispense Refill  . amLODipine-olmesartan (AZOR) 10-40 MG per tablet Take 1 tablet by mouth daily.      . colchicine (COLCRYS) 0.6 MG tablet Take 1 tablet (0.6 mg total) by mouth 2 (two) times daily.  180 tablet  3  . doxazosin (CARDURA) 8 MG tablet TAKE ONE TABLET BY MOUTH EVERY DAY  30 tablet  0  . hydrALAZINE (APRESOLINE) 25 MG tablet TAKE ONE TABLET BY MOUTH TWICE DAILY  60 tablet  6  . hydrochlorothiazide (HYDRODIURIL) 25 MG tablet TAKE ONE TABLET BY MOUTH EVERY DAY  30 tablet  0  . HYDROcodone-acetaminophen (NORCO/VICODIN) 5-325 MG per tablet 1 or 2 tabs PO q6 hours prn pain  25 tablet  0  . ibuprofen (ADVIL,MOTRIN) 200 MG tablet Take 800 mg by mouth every 6 (six)  hours as needed for pain.      . isosorbide mononitrate (IMDUR) 30 MG 24 hr tablet TAKE ONE TABLET BY MOUTH EVERY DAY  30 tablet  6  . methocarbamol (ROBAXIN) 500 MG tablet Take 2 tablets (1,000 mg total) by mouth 4 (four) times daily as needed (muscle spasm/pain).  25 tablet  0  . metoCLOPramide (REGLAN) 10 MG tablet Take one tablet 30 minutes prior to each meal  90 tablet  6  . metoprolol (LOPRESSOR) 100 MG tablet Take 1 tablet (100 mg total) by mouth 2 (two) times daily. Pt. Is overdue for OV and needs to schedule  180 tablet  3  . pravastatin (PRAVACHOL) 20 MG tablet TAKE ONE TABLET BY MOUTH EVERY DAY  30 tablet  0  . pravastatin (PRAVACHOL) 40 MG tablet Take 40 mg by mouth daily.       No current facility-administered medications on file prior to visit.   Review of  Systems  Constitutional: Negative for unexpected weight change, or unusual diaphoresis  HENT: Negative for tinnitus.   Eyes: Negative for photophobia and visual disturbance.  Respiratory: Negative for choking and stridor.   Gastrointestinal: Negative for vomiting and blood in stool.  Genitourinary: Negative for hematuria and decreased urine volume.  Musculoskeletal: Negative for acute joint swelling Skin: Negative for color change and wound.  Neurological: Negative for tremors and numbness other than noted  Psychiatric/Behavioral: Negative for decreased concentration or  hyperactivity.        Objective:   Physical Exam BP 134/80  Pulse 84  Temp(Src) 98 F (36.7 C) (Oral)  Ht 5\' 9"  (1.753 m)  Wt 332 lb 4 oz (150.708 kg)  BMI 49.04 kg/m2  SpO2 94% VS noted, not ill appearing Constitutional: Pt appears well-developed and well-nourished.  HENT: Head: NCAT.  Right Ear: External ear normal.  Left Ear: External ear normal.  Eyes: Conjunctivae and EOM are normal. Pupils are equal, round, and reactive to light.  Neck: Normal range of motion. Neck supple.  Cardiovascular: Normal rate and regular rhythm.   Pulmonary/Chest: Effort normal and breath sounds normal.  Abd:  Soft, NT, non-distended, + BS Neurological: Pt is alert. Not confused  Skin: Skin is warm. No erythema.  Psychiatric: Pt behavior is normal. Thought content normal. 2+ nervous    Assessment & Plan:

## 2012-04-10 NOTE — Assessment & Plan Note (Addendum)
With 1600 cal/day diet per pt, but much less active in past few months to account for rapid a1c increase; for incr metformin to 1000 bid, and add glipizide xl 10 Lab Results  Component Value Date   HGBA1C 9.6* 04/01/2012

## 2012-04-10 NOTE — Assessment & Plan Note (Signed)
Also for GI f/u later this mo, likely may need colonoscopy

## 2012-04-10 NOTE — Assessment & Plan Note (Signed)
?   Fatty liver, but for GI f/u later this month

## 2012-04-10 NOTE — Patient Instructions (Addendum)
Your are given the azor samples today, and the discount card to see if it helps OK to increase the metformin to 1000 twice per day Please take all new medication as prescribed - the glipizide ER 10 mg per day Please continue all other medications as before, and refills have been done if requested. Please continue your efforts at being more active, low cholesterol diabetic diet, and weight control. You will be contacted regarding the referral for: Diabetes Education (this is in the [facility]) Please keep your appointments with your specialists as you have planned - GI in [place] for the nausea, abnormal liver tests, and the blood in the stool) We'll hold off on more blood work today Please call if you want the referral to orthopedic Please return in 3 months, or sooner if needed

## 2012-04-10 NOTE — Assessment & Plan Note (Signed)
stable overall by history and exam, recent data reviewed with pt, and pt to continue medical treatment as before,  to f/u any worsening symptoms or concerns Lab Results  Component Value Date   [facility]  Value: 177        Total Cholesterol/HDL:CHD Risk Coronary Heart Disease Risk Table                     Men   Women  1/2 Average Risk   3.4   3.3  Average Risk       5.0   4.4  2 X Average Risk   9.6   7.1  3 X Average Risk  23.4   11.0        Use the calculated Patient Ratio above and the CHD Risk Table to determine the patient's CHD Risk.        ATP III CLASSIFICATION (LDL):  <100     mg/dL   Optimal  098-119  mg/dL   Near or Above                    Optimal  130-159  mg/dL   Borderline  147-829  mg/dL   High  >562     mg/dL   Very High* 02/21/8655

## 2012-04-10 NOTE — Assessment & Plan Note (Signed)
?   Gastroparesis, for f/u GI as planned

## 2012-04-16 ENCOUNTER — Ambulatory Visit: Payer: Self-pay | Admitting: Dietician

## 2012-04-21 ENCOUNTER — Other Ambulatory Visit: Payer: Self-pay | Admitting: Internal Medicine

## 2012-04-21 ENCOUNTER — Encounter: Payer: Self-pay | Admitting: Gastroenterology

## 2012-04-21 ENCOUNTER — Ambulatory Visit (INDEPENDENT_AMBULATORY_CARE_PROVIDER_SITE_OTHER): Payer: Self-pay | Admitting: Gastroenterology

## 2012-04-21 VITALS — BP 153/99 | HR 81 | Temp 97.6°F | Ht 69.0 in | Wt 334.0 lb

## 2012-04-21 DIAGNOSIS — Z83719 Family history of colon polyps, unspecified: Secondary | ICD-10-CM | POA: Insufficient documentation

## 2012-04-21 DIAGNOSIS — R7989 Other specified abnormal findings of blood chemistry: Secondary | ICD-10-CM

## 2012-04-21 DIAGNOSIS — Z8371 Family history of colonic polyps: Secondary | ICD-10-CM

## 2012-04-21 DIAGNOSIS — K625 Hemorrhage of anus and rectum: Secondary | ICD-10-CM

## 2012-04-21 DIAGNOSIS — K769 Liver disease, unspecified: Secondary | ICD-10-CM

## 2012-04-21 DIAGNOSIS — K921 Melena: Secondary | ICD-10-CM

## 2012-04-21 DIAGNOSIS — R110 Nausea: Secondary | ICD-10-CM

## 2012-04-21 DIAGNOSIS — E119 Type 2 diabetes mellitus without complications: Secondary | ICD-10-CM

## 2012-04-21 DIAGNOSIS — R945 Abnormal results of liver function studies: Secondary | ICD-10-CM

## 2012-04-21 LAB — HEPATITIS C ANTIBODY: HCV Ab: NEGATIVE

## 2012-04-21 LAB — IRON AND TIBC
%SAT: 38 % (ref 20–55)
Iron: 123 ug/dL (ref 42–165)
TIBC: 324 ug/dL (ref 215–435)
UIBC: 201 ug/dL (ref 125–400)

## 2012-04-21 LAB — FERRITIN: Ferritin: 938 ng/mL — ABNORMAL HIGH (ref 22–322)

## 2012-04-21 LAB — HEPATITIS B SURFACE ANTIGEN: Hepatitis B Surface Ag: NEGATIVE

## 2012-04-21 MED ORDER — PEG 3350-KCL-NA BICARB-NACL 420 G PO SOLR: 4000.0000 mL | ORAL | Status: DC

## 2012-04-21 NOTE — Progress Notes (Signed)
Primary Care Physician:  Oliver Barre, MD Referring Provider: Joni Reining, NP with [facility] Primary Gastroenterologist: Roetta Sessions, MD    Chief Complaint  Patient presents with  . Nausea  . Rectal Bleeding    HPI:  Nicholas Turner is a 42 y.o. male here at the request of Joni Reining, NP with [facility]. Consultation was requested after office visit with her on 03/31/2012 at which time the patient complained of chronic constant nausea. Patient was started on Reglan 10 mg before meals by Ms. Lawrence at that time. He was also noted to have elevated alkaline phosphatase, AST and ALT. Patient reported rectal bleeding as well.  Patient states that he has nausea with activity, washing dishes/wash car. Associated with dizziness and feeling like he is going to pass out. Has to rest a lot. Going on for months. Some nausea with meals but mostly nausea better with meals/rest. He believes Reglan is helping some. He is frustrated with 10 pound weight gain since March 10th. States once he checked his blood sugar during episode of nausea and it was over 400.  Was on metformin only 500mg  per day and recently increased to 1000mg  BID (just started within the last 1-2 weeks. Pravachol chronically. Vicodin about 2 times per day. One year ago, took a lot of daily Tylenol. Ibuprofen less than once per week. Usually BM twice per day. On 04/06/12, two episodes of BRBPR. On 04/08/12, bleeding noted. No abdominal pain or rectal pain. Occasional left sided abdominal pain when has to have BM. Occasional heartburn, TUMs rare. Occasional vomiting.   No prior gallbladder or liver imaging.  Weight of 319 pounds on 11/26/2011. Weight is 334 pounds today.  Current Outpatient Prescriptions  Medication Sig Dispense Refill  . amLODipine-olmesartan (AZOR) 10-40 MG per tablet Take 1 tablet by mouth daily.      . colchicine (COLCRYS) 0.6 MG tablet Take 1 tablet (0.6 mg total) by mouth 2 (two) times  daily.  180 tablet  3  . doxazosin (CARDURA) 8 MG tablet TAKE ONE TABLET BY MOUTH EVERY DAY  30 tablet  0  . glipiZIDE (GLUCOTROL XL) 10 MG 24 hr tablet Take 1 tablet (10 mg total) by mouth daily.  90 tablet  3  . hydrALAZINE (APRESOLINE) 25 MG tablet TAKE ONE TABLET BY MOUTH TWICE DAILY  60 tablet  6  . hydrochlorothiazide (HYDRODIURIL) 25 MG tablet TAKE ONE TABLET BY MOUTH EVERY DAY  30 tablet  0  . HYDROcodone-acetaminophen (NORCO/VICODIN) 5-325 MG per tablet 1 or 2 tabs PO q6 hours prn pain  25 tablet  0  . ibuprofen (ADVIL,MOTRIN) 200 MG tablet Take 800 mg by mouth every 6 (six) hours as needed for pain.      . isosorbide mononitrate (IMDUR) 30 MG 24 hr tablet TAKE ONE TABLET BY MOUTH EVERY DAY  30 tablet  6  . metFORMIN (GLUCOPHAGE) 500 MG tablet Take 2 tablets (1,000 mg total) by mouth 2 (two) times daily with a meal.  180 tablet  3  . methocarbamol (ROBAXIN) 500 MG tablet Take 2 tablets (1,000 mg total) by mouth 4 (four) times daily as needed (muscle spasm/pain).  25 tablet  0  . metoprolol (LOPRESSOR) 100 MG tablet Take 1 tablet (100 mg total) by mouth 2 (two) times daily. Pt. Is overdue for OV and needs to schedule  180 tablet  3  . pravastatin (PRAVACHOL) 20 MG tablet TAKE ONE TABLET BY MOUTH EVERY DAY  30 tablet  0  .  polyethylene glycol-electrolytes (TRILYTE) 420 G solution Take 4,000 mLs by mouth as directed.  4000 mL  0   No current facility-administered medications for this visit.    Allergies as of 04/21/2012 - Review Complete 04/21/2012  Allergen Reaction Noted  . Clonidine derivatives Other (See Comments) 05/10/2010  . Oxycodone Nausea And Vomiting 09/01/2010    Past Medical History  Diagnosis Date  . Knee pain     feet   . Gout   . Hypertension   . Diabetes mellitus   . Palpitation   . Generalized headaches   . UTI (lower urinary tract infection)   . DM (diabetes mellitus) 09/01/2010       . Obesity (BMI 30-39.9) 09/01/2010  . Gout 11/26/2011  . HYPERTENSION,  UNCONTROLLED 09/09/2009    Qualifier: Diagnosis of  By: Via LPN, Larita Fife    . SLEEP APNEA 09/09/2009    Qualifier: Diagnosis of  By: Via LPN, Larita Fife    . Arthritis   . Hyperlipidemia   . Kidney stones   . Chronic neck pain     Past Surgical History  Procedure Laterality Date  . Kidney stone surgery      post ureteral stent and status post lithotripsy (10-15 yrs Ago)    Family History  Problem Relation Age of Onset  . Arthritis Mother   . Arthritis Other   . Heart disease Other   . Hypertension Other   . Sudden death Other   . Arthritis Other   . Heart disease Other   . Hypertension Other   . Alcohol abuse Other   . Cancer Other   . Asthma Other     breast cancer  . Heart disease Other   . Hypertension Other   . Diabetes Other   . Colon cancer Neg Hx   . Lung cancer Maternal Uncle   . Breast cancer Maternal Aunt   . Colon polyps Sister     age 48  . Colon polyps Mother     History   Social History  . Marital Status: Married    Spouse Name: N/A    Number of Children: 3  . Years of Education: N/A   Occupational History  . full time      pest control   Social History Main Topics  . Smoking status: Never Smoker   . Smokeless tobacco: Never Used  . Alcohol Use: No  . Drug Use: No  . Sexually Active: Not on file   Other Topics Concern  . Not on file   Social History Narrative  . No narrative on file      ROS:  General: Negative for anorexia, weight loss, fever, chills, weakness. The history of present illness Eyes: Negative for vision changes.  ENT: Negative for hoarseness, difficulty swallowing , nasal congestion. CV: Negative for chest pain, angina, palpitations, dyspnea on exertion, peripheral edema.  Respiratory: Negative for dyspnea at rest, dyspnea on exertion, cough, sputum, wheezing.  GI: See history of present illness. GU:  Negative for dysuria, hematuria, urinary incontinence, urinary frequency, nocturnal urination.  MS: Negative for joint  pain, low back pain.  Derm: Negative for rash or itching.  Neuro: Negative for weakness, abnormal sensation, seizure, frequent headaches, memory loss, confusion.  Psych: Negative for anxiety, depression, suicidal ideation, hallucinations.  Endo: Negative for unusual weight change.  Heme: Negative for bruising or bleeding. Allergy: Negative for rash or hives.    Physical Examination:  BP 153/99  Pulse 81  Temp(Src) 97.6 F (  36.4 C) (Oral)  Ht 5\' 9"  (1.753 m)  Wt 334 lb (151.501 kg)  BMI 49.3 kg/m2   General: Well-nourished, well-developed in no acute distress. Obese. Accompanied by wife. Head: Normocephalic, atraumatic.   Eyes: Conjunctiva pink, no icterus. Mouth: Oropharyngeal mucosa moist and pink , no lesions erythema or exudate. Neck: Supple without thyromegaly, masses, or lymphadenopathy.  Lungs: Clear to auscultation bilaterally.  Heart: Regular rate and rhythm, no murmurs rubs or gallops.  Abdomen: Bowel sounds are normal, nontender, nondistended, no hepatosplenomegaly or masses, no abdominal bruits or    hernia , no rebound or guarding.   Rectal: defer Extremities: No lower extremity edema. No clubbing or deformities.  Neuro: Alert and oriented x 4 , grossly normal neurologically.  Skin: Warm and dry, no rash or jaundice.   Psych: Alert and cooperative, normal mood and affect.  Labs: Lab Results  Component Value Date   ALT 91* 04/01/2012   AST 64* 04/01/2012   ALKPHOS 161* 04/01/2012   BILITOT 0.7 04/01/2012  11/26/11 ALT 22   AST 26   AP 116  Lab Results  Component Value Date   AMYLASE 30 04/01/2012   Lab Results  Component Value Date   CREATININE 0.83 04/01/2012   BUN 15 04/01/2012   NA 139 04/01/2012   K 3.9 04/01/2012   CL 101 04/01/2012   CO2 27 04/01/2012   Lab Results  Component Value Date   WBC 6.1 11/26/2011   HGB 13.2 11/26/2011   HCT 41.8 11/26/2011   MCV 88.7 11/26/2011   PLT 276.0 11/26/2011   Lab Results  Component Value Date   TSH 1.18 11/26/2011     Imaging Studies: HIDA 03/2011: normal

## 2012-04-21 NOTE — Progress Notes (Signed)
CC PCP 

## 2012-04-21 NOTE — Patient Instructions (Addendum)
Please stop Reglan which was given to you for nausea. I would recommend you use it only when needed for severe nausea because it can have potential permanent side effects including uncontrollable mouth movements.  Please have your blood work done. We have scheduled you for an abdominal ultrasound to look at your gallbladder and liver. We have scheduled you for an upper endoscopy and colonoscopy with Dr. Jena Gauss. Please see separate instructions. Please keep a diary of your nausea episodes. Please record your blood glucose, blood pressure, any preceding meals. Once you have several episodes documented, please drop your log off at our office for my review.

## 2012-04-21 NOTE — Assessment & Plan Note (Signed)
Small volume hematochezia recently. Family history significant for colon polyps (mother and sister). He has never had a colonoscopy. Recommend one at this time.  I have discussed the risks, alternatives, benefits with regards to but not limited to the risk of reaction to medication, bleeding, infection, perforation and the patient is agreeable to proceed. Written consent to be obtained.

## 2012-04-21 NOTE — Assessment & Plan Note (Signed)
Abnormal alkaline phosphatase, AST, ALT. In November 2013 they will normal however they have been abnormal prior to that. May be secondary to fatty liver, Pravachol. Need to evaluate further. Abdominal ultrasound the near future. Doubt we are dealing with gallbladder issues given that his nausea is predominantly related to activity rather than meals. Will also check viral markers, for hemachromatosis. He has known abnormal sedimentation rate (question related to gout), normal ANA.

## 2012-04-21 NOTE — Assessment & Plan Note (Signed)
Predominantly related to activity. Complains of feeling nauseated, dizzy with activity. This is not typical symptom seen with gastroparesis, gastritis, peptic ulcer disease, biliary. I have requested that he keep a diary of episodes including checking his blood glucose, blood pressure during the episodes. He will also track whether not he had had a meal prior to the episode. He denied any history of chest pain. After a couple of entries into the diary he will drop these off for my review. We will plan check upper endoscopy at time of his colonoscopy as he does have some postprandial component at times. I have requested that he stop Reglan due to the risk of tardive dyskinesia as it is not clear that he needs his medication at this time.

## 2012-04-23 NOTE — Progress Notes (Signed)
Quick Note:  Ferritin elevated. Known elevated sed rate too. Will discuss with Dr. Jena Gauss. Doubt hemochromatosis.  Viral markers negative. Await abd u/s. Await EGD/TCS. ______

## 2012-04-25 ENCOUNTER — Encounter (HOSPITAL_COMMUNITY): Payer: Self-pay | Admitting: Pharmacy Technician

## 2012-04-30 MED ORDER — SODIUM CHLORIDE 0.9 % IV SOLN
INTRAVENOUS | Status: DC
  Administered 2012-05-01: 1000 mL via INTRAVENOUS

## 2012-05-01 ENCOUNTER — Encounter (HOSPITAL_COMMUNITY)
Admission: RE | Disposition: A | Discharge status: Home Health | Payer: Self-pay | Source: Ambulatory Visit | Attending: Internal Medicine

## 2012-05-01 ENCOUNTER — Encounter (HOSPITAL_COMMUNITY): Payer: Self-pay | Admitting: *Deleted

## 2012-05-01 ENCOUNTER — Ambulatory Visit (HOSPITAL_COMMUNITY)
Admission: RE | Admit: 2012-05-01 | Discharge: 2012-05-01 | Disposition: A | Discharge status: Home Health | Payer: Self-pay | Source: Ambulatory Visit | Attending: Internal Medicine | Admitting: Internal Medicine

## 2012-05-01 DIAGNOSIS — D126 Benign neoplasm of colon, unspecified: Secondary | ICD-10-CM | POA: Insufficient documentation

## 2012-05-01 DIAGNOSIS — K5730 Diverticulosis of large intestine without perforation or abscess without bleeding: Secondary | ICD-10-CM | POA: Insufficient documentation

## 2012-05-01 DIAGNOSIS — A048 Other specified bacterial intestinal infections: Secondary | ICD-10-CM | POA: Insufficient documentation

## 2012-05-01 DIAGNOSIS — R112 Nausea with vomiting, unspecified: Secondary | ICD-10-CM | POA: Insufficient documentation

## 2012-05-01 DIAGNOSIS — R933 Abnormal findings on diagnostic imaging of other parts of digestive tract: Secondary | ICD-10-CM

## 2012-05-01 DIAGNOSIS — K921 Melena: Secondary | ICD-10-CM

## 2012-05-01 DIAGNOSIS — E669 Obesity, unspecified: Secondary | ICD-10-CM | POA: Insufficient documentation

## 2012-05-01 DIAGNOSIS — E119 Type 2 diabetes mellitus without complications: Secondary | ICD-10-CM | POA: Insufficient documentation

## 2012-05-01 DIAGNOSIS — R7989 Other specified abnormal findings of blood chemistry: Secondary | ICD-10-CM

## 2012-05-01 DIAGNOSIS — Z6841 Body Mass Index (BMI) 40.0 and over, adult: Secondary | ICD-10-CM | POA: Insufficient documentation

## 2012-05-01 DIAGNOSIS — K2940 Chronic atrophic gastritis without bleeding: Secondary | ICD-10-CM | POA: Insufficient documentation

## 2012-05-01 DIAGNOSIS — I10 Essential (primary) hypertension: Secondary | ICD-10-CM | POA: Insufficient documentation

## 2012-05-01 DIAGNOSIS — K449 Diaphragmatic hernia without obstruction or gangrene: Secondary | ICD-10-CM | POA: Insufficient documentation

## 2012-05-01 DIAGNOSIS — Z01812 Encounter for preprocedural laboratory examination: Secondary | ICD-10-CM | POA: Insufficient documentation

## 2012-05-01 DIAGNOSIS — K625 Hemorrhage of anus and rectum: Secondary | ICD-10-CM

## 2012-05-01 DIAGNOSIS — Z8371 Family history of colonic polyps: Secondary | ICD-10-CM

## 2012-05-01 DIAGNOSIS — R110 Nausea: Secondary | ICD-10-CM

## 2012-05-01 HISTORY — PX: COLONOSCOPY WITH ESOPHAGOGASTRODUODENOSCOPY (EGD): SHX5779

## 2012-05-01 LAB — GLUCOSE, CAPILLARY
Glucose-Capillary: 182 mg/dL — ABNORMAL HIGH (ref 70–99)
Glucose-Capillary: 137 mg/dL — ABNORMAL HIGH (ref 70–99)

## 2012-05-01 SURGERY — COLONOSCOPY WITH ESOPHAGOGASTRODUODENOSCOPY (EGD)
Anesthesia: Moderate Sedation

## 2012-05-01 MED ORDER — STERILE WATER FOR IRRIGATION IR SOLN
Status: DC | PRN
  Administered 2012-05-01: 11:00:00

## 2012-05-01 MED ORDER — MIDAZOLAM HCL 5 MG/5ML IJ SOLN
INTRAMUSCULAR | Status: AC
  Filled 2012-05-01: qty 10

## 2012-05-01 MED ORDER — BUTAMBEN-TETRACAINE-BENZOCAINE 2-2-14 % EX AERO
INHALATION_SPRAY | CUTANEOUS | Status: DC | PRN
  Administered 2012-05-01: 2 via TOPICAL

## 2012-05-01 MED ORDER — ONDANSETRON HCL 4 MG/2ML IJ SOLN
INTRAMUSCULAR | Status: AC
  Filled 2012-05-01: qty 2

## 2012-05-01 MED ORDER — MIDAZOLAM HCL 5 MG/5ML IJ SOLN
INTRAMUSCULAR | Status: DC | PRN
  Administered 2012-05-01: 2 mg via INTRAVENOUS
  Administered 2012-05-01: 1 mg via INTRAVENOUS
  Administered 2012-05-01: 2 mg via INTRAVENOUS

## 2012-05-01 MED ORDER — ONDANSETRON HCL 4 MG/2ML IJ SOLN
INTRAMUSCULAR | Status: DC | PRN
  Administered 2012-05-01: 4 mg via INTRAVENOUS

## 2012-05-01 MED ORDER — MEPERIDINE HCL 100 MG/ML IJ SOLN
INTRAMUSCULAR | Status: AC
  Filled 2012-05-01: qty 1

## 2012-05-01 MED ORDER — MEPERIDINE HCL 100 MG/ML IJ SOLN
INTRAMUSCULAR | Status: DC | PRN
  Administered 2012-05-01: 25 mg via INTRAVENOUS
  Administered 2012-05-01: 50 mg via INTRAVENOUS
  Administered 2012-05-01: 25 mg via INTRAVENOUS

## 2012-05-01 NOTE — Interval H&P Note (Signed)
History and Physical Interval Note:  05/01/2012 10:51 AM  Nicholas Turner  has presented today for surgery, with the diagnosis of INCREASED LFT'S, RECTAL BLEEDING, NAUSEA AND FAMILY HISTORY OF COLON POLYS  The various methods of treatment have been discussed with the patient and family. After consideration of risks, benefits and other options for treatment, the patient has consented to  Procedure(s) with comments: COLONOSCOPY WITH ESOPHAGOGASTRODUODENOSCOPY (EGD) (N/A) - 11:00 as a surgical intervention .  The patient's history has been reviewed, patient examined, no change in status, stable for surgery.  I have reviewed the patient's chart and labs.  Questions were answered to the patient's satisfaction.     Nicholas Turner  EGD and colonoscopy to be performed per plan.The risks, benefits, limitations, imponderables and alternatives regarding both EGD and colonoscopy have been reviewed with the patient. Questions have been answered. All parties agreeable.

## 2012-05-01 NOTE — Op Note (Signed)
[facility] [address] [place], [address]   ENDOSCOPY PROCEDURE REPORT  PATIENT: Nicholas Turner, Nicholas Turner  MR#: [id number] BIRTHDATE: [birth year] , 42  yrs. old GENDER: Male ENDOSCOPIST: R.  Roetta Sessions, MD FACP FACG REFERRED BY:  Oliver Barre, M.D.  Kathlen Brunswick, M.D. PROCEDURE DATE:  05/01/2012 PROCEDURE:     EGD with gastric biopsy  INDICATIONS:      Nausea and vomiting  INFORMED CONSENT:   The risks, benefits, limitations, alternatives and imponderables have been discussed.  The potential for biopsy, esophogeal dilation, etc. have also been reviewed.  Questions have been answered.  All parties agreeable.  Please see the history and physical in the medical record for more information.  MEDICATIONS:     Versed 4 mg IV and Demerol 75 mg IV in divided doses. Cetacaine spray. Zofran 4 mg IV  DESCRIPTION OF PROCEDURE:   The AO-[id number] ([id number])  endoscope was introduced through the mouth and advanced to the second portion of the duodenum without difficulty or limitations.  The mucosal surfaces were surveyed very carefully during advancement of the scope and upon withdrawal.  Retroflexion view of the proximal stomach and esophagogastric junction was performed.      FINDINGS: Normal esophagus. No esophageal varices. Stomach empty. Small hiatal hernia. Diffuse "fishscale" or  "snakeskin" appearance of the proximal gastric mucosa with rather intense patchy erythema of the remainder of the mucosa without ulcer or infiltrating process observed. Patent pylorus. Normal first and second portion of the duodenum  THERAPEUTIC / DIAGNOSTIC MANEUVERS PERFORMED:   COMPLICATIONS:  None  IMPRESSION:  Normal esophagus. Small hiatal hernia. 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 pylori infection-status post biopsy.  RECOMMENDATIONS:    Weight loss recommended. Much better glycemic control needed (recent hemoglobin A1c almost 10).  Further recommendations to follow pending review of pathology report. See colonoscopy report. Office followup regarding elevated liver function studies in the near future.    _______________________________ R. Roetta Sessions, MD FACP [facility] eSigned:  R. Roetta Sessions, MD FACP [facility] 05/01/2012 11:19 AM     CC:  PATIENT NAME:  Nicholas Turner, Nicholas Turner MR#: [id number]

## 2012-05-01 NOTE — H&P (View-Only) (Signed)
Quick Note:  Ferritin elevated. Known elevated sed rate too. Will discuss with Dr. Rourk. Doubt hemochromatosis.  Viral markers negative. Await abd u/s. Await EGD/TCS. ______ 

## 2012-05-01 NOTE — Op Note (Signed)
[facility] [address] [place], [address]   COLONOSCOPY PROCEDURE REPORT  PATIENT: Nicholas Turner, Nicholas Turner  MR#:         [id number] BIRTHDATE: 06-09-1970 , 42  yrs. old GENDER: Male ENDOSCOPIST: R.  Roetta Sessions, MD FACP FACG REFERRED BY:  Kathlen Brunswick, M.D.  Oliver Barre, M.D. PROCEDURE DATE:  05/01/2012 PROCEDURE:     Ileocolonoscopy with snare polypectomy  INDICATIONS: paper hematochezia  INFORMED CONSENT:  The risks, benefits, alternatives and imponderables including but not limited to bleeding, perforation as well as the possibility of a missed lesion have been reviewed.  The potential for biopsy, lesion removal, etc. have also been discussed.  Questions have been answered.  All parties agreeable. Please see the history and physical in the medical record for more information.  MEDICATIONS: Versed 5 mg IV and Demerol 100 mg IV in divided doses.  DESCRIPTION OF PROCEDURE:  After a digital rectal exam was performed, the EG-[id number] ([id number]) and EC-[id number] ([id number]) colonoscope was advanced from the anus through the rectum and colon to the area of the cecum, ileocecal valve and appendiceal orifice. The cecum was deeply intubated.  These structures were well-seen and photographed for the record.  From the level of the cecum and ileocecal valve, the scope was slowly and cautiously withdrawn. The mucosal surfaces were carefully surveyed utilizing scope tip deflection to facilitate fold flattening as needed.  The scope was pulled down into the rectum where a thorough examination including retroflexion was performed.    FINDINGS:  Adequate preparation. Minimally excoriated anal canal tissue. No hemorrhoids seen. Normal rectum. Pancolonic diverticulosis. (1) 8 mm provided polyp just distal to the ileocecal valve. The remainder of the colonic mucosa appeared normal.  THERAPEUTIC / DIAGNOSTIC MANEUVERS PERFORMED:  The above-mentioned polyp was hot snare removed  and recovered.  COMPLICATIONS: None  CECAL WITHDRAWAL TIME:  7 minute  IMPRESSION:  Colonic diverticulosis. Colonic polyp-removed as described above. Excoriated anal canal likely source of paper hematochezia  RECOMMENDATIONS: Ten-day course of Anusol suppositories. Begin Benefiber 2 tablespoons daily. Follow up on pathology. See EGD report.   _______________________________ eSigned:  R. Roetta Sessions, MD FACP [facility] 05/01/2012 11:49 AM   CC:    PATIENT NAME:  [name], [name] MR#: [id number]

## 2012-05-02 ENCOUNTER — Telehealth: Payer: Self-pay | Admitting: Gastroenterology

## 2012-05-02 MED ORDER — OMEPRAZOLE MAGNESIUM 20 MG PO TBEC: 20.0000 mg | DELAYED_RELEASE_TABLET | Freq: Two times a day (BID) | ORAL | Status: DC

## 2012-05-02 MED ORDER — BIS SUBCIT-METRONID-TETRACYC 140-125-125 MG PO CAPS: 3.0000 | ORAL_CAPSULE | Freq: Three times a day (TID) | ORAL | Status: DC

## 2012-05-02 NOTE — Telephone Encounter (Signed)
Discussed with Raynelle Fanning. Path came back positive for H.Pylori. We will start Pylera 3 capsule QID for 10 days. Samples to be provided as pt is uninsured. He will also need omeprazole BID while on therapy. Can give Prilosec OTC samples.   Patient should hold fish oil while on Pylera.  Further information regarding path per Dr. Jena Gauss.

## 2012-05-04 ENCOUNTER — Encounter: Payer: Self-pay | Admitting: Internal Medicine

## 2012-05-05 ENCOUNTER — Encounter (HOSPITAL_COMMUNITY): Payer: Self-pay | Admitting: Internal Medicine

## 2012-05-05 NOTE — Telephone Encounter (Signed)
Pt is aware, samples of pylera and prilosec at the front desk. Pt is aware to stop the fish oil. The letter that [facility] has done is in the bag with his samples and pt is aware of this.

## 2012-05-06 ENCOUNTER — Ambulatory Visit (HOSPITAL_COMMUNITY)
Admit: 2012-05-06 | Discharge: 2012-05-06 | Disposition: A | Discharge status: Home / Self Care | Payer: Self-pay | Source: Ambulatory Visit | Attending: Gastroenterology | Admitting: Gastroenterology

## 2012-05-06 DIAGNOSIS — R110 Nausea: Secondary | ICD-10-CM | POA: Insufficient documentation

## 2012-05-06 DIAGNOSIS — E119 Type 2 diabetes mellitus without complications: Secondary | ICD-10-CM

## 2012-05-06 DIAGNOSIS — K921 Melena: Secondary | ICD-10-CM

## 2012-05-06 DIAGNOSIS — Z8371 Family history of colonic polyps: Secondary | ICD-10-CM

## 2012-05-06 DIAGNOSIS — R748 Abnormal levels of other serum enzymes: Secondary | ICD-10-CM | POA: Insufficient documentation

## 2012-05-06 DIAGNOSIS — K769 Liver disease, unspecified: Secondary | ICD-10-CM | POA: Insufficient documentation

## 2012-05-06 DIAGNOSIS — R945 Abnormal results of liver function studies: Secondary | ICD-10-CM

## 2012-05-08 ENCOUNTER — Telehealth: Payer: Self-pay | Admitting: Internal Medicine

## 2012-05-08 NOTE — Telephone Encounter (Signed)
Pt called to ask what Ferritin means. He was looking on My Chart and seen that on his labs and has questions about it. Please call him at [phone]

## 2012-05-08 NOTE — Telephone Encounter (Signed)
Spoke with pt and answered all his questions

## 2012-05-12 ENCOUNTER — Other Ambulatory Visit: Payer: Self-pay | Admitting: Adult Health

## 2012-05-13 ENCOUNTER — Other Ambulatory Visit: Payer: Self-pay

## 2012-05-13 MED ORDER — AMLODIPINE-OLMESARTAN 10-40 MG PO TABS: 1.0000 | ORAL_TABLET | Freq: Every day | ORAL | Status: DC

## 2012-05-14 NOTE — Progress Notes (Signed)
Quick Note:  Dr. Jena Gauss wrote:  "Probably has NASH; iron sat makes iron overload less likely; ferritin and esr likely acute phase reactant; ; if they stay up for 6 mos or more could just pull trigger on bx" ______

## 2012-05-14 NOTE — Progress Notes (Signed)
Quick Note:  Likely NASH.  Instructions for fatty liver: Recommend 1-2# weight loss per week until ideal body weight through exercise & diet. Low fat/cholesterol diet.  Avoid sweets, sodas, fruit juices, sweetened beverages like tea, etc. Gradually increase exercise from 15 min daily up to 1 hr per day 5 days/week. Limit alcohol use.  Repeat LFTs, ferritin, ESR in 3 months. ______

## 2012-05-26 ENCOUNTER — Ambulatory Visit: Payer: Self-pay | Admitting: Internal Medicine

## 2012-06-11 ENCOUNTER — Encounter: Payer: Self-pay | Admitting: Internal Medicine

## 2012-06-12 ENCOUNTER — Ambulatory Visit (INDEPENDENT_AMBULATORY_CARE_PROVIDER_SITE_OTHER): Payer: Self-pay | Admitting: Gastroenterology

## 2012-06-12 ENCOUNTER — Encounter: Payer: Self-pay | Admitting: Gastroenterology

## 2012-06-12 VITALS — BP 130/80 | HR 71 | Temp 97.4°F | Ht 69.0 in | Wt 324.0 lb

## 2012-06-12 DIAGNOSIS — K921 Melena: Secondary | ICD-10-CM

## 2012-06-12 DIAGNOSIS — Z860100 Personal history of colon polyps, unspecified: Secondary | ICD-10-CM | POA: Insufficient documentation

## 2012-06-12 DIAGNOSIS — K769 Liver disease, unspecified: Secondary | ICD-10-CM

## 2012-06-12 DIAGNOSIS — R110 Nausea: Secondary | ICD-10-CM

## 2012-06-12 DIAGNOSIS — R945 Abnormal results of liver function studies: Secondary | ICD-10-CM

## 2012-06-12 MED ORDER — OMEPRAZOLE MAGNESIUM 20 MG PO TBEC: 20.0000 mg | DELAYED_RELEASE_TABLET | Freq: Every day | ORAL | Status: DC

## 2012-06-12 NOTE — Assessment & Plan Note (Signed)
Likely fatty liver. Ultrasound somewhat limited due to body habitus. Repeat his LFTs towards the end of July. If remain elevated, may need further serologies. May consider CT imaging of the liver for more in-depth look. Patient has lost 10 pounds since his last office visit. Encouraged 10 additional pounds of the next 3 months. He needs to strive towards tight glycemic control.

## 2012-06-12 NOTE — Assessment & Plan Note (Signed)
Colonoscopy in April 2019

## 2012-06-12 NOTE — Assessment & Plan Note (Signed)
Resolved. Due to excoriated anal canal.

## 2012-06-12 NOTE — Patient Instructions (Addendum)
1. Take Prilosec OTC one daily for 6-8 weeks. Samples provided. 2. You will be due for repeat blood work towards the end of July. We will send you a reminder letter. 3. Congratulations on your weight loss. I would like you to lose 10 more pounds over the next 3 months.  4. Maintain tight control of your diabetes. 5. Call if symptoms do not continue to improve.

## 2012-06-12 NOTE — Progress Notes (Signed)
Primary Care Physician: Oliver Barre, MD  Primary Gastroenterologist:  Roetta Sessions, MD   Chief Complaint  Patient presents with  . Follow-up    HPI: Nicholas Turner is a 42 y.o. male here for followup of recent EGD and colonoscopy. Procedures done in April 2014 for nausea, paper hematochezia. He had a small hiatal hernia, abnormal gastric mucosa suggestive of portal gastropathy and/or H. pylori infection (confirmed H. pylori on biopsy), colonic diverticulosis, single tubular adenoma removed distal the ICV, excoriated anal canal.   Patient has a history of chronic nausea worsened with activity. We requested that he keep a diary of his symptoms along with his blood glucose level at the time. It is notable that he has had only 4 episodes of nausea this month. Symptoms have improved with H. pylori treatment. He completed Pylera several weeks ago. 2 of the 4 episodes he was noted to have relative hypoglycemia with blood glucose level in the 70s to 80s. Patient noted jitteriness along with nausea. He also was noted to have an elevated alkaline phosphatase, AST and ALT previously.  Fatty liver noted on ultrasound. He will have repeat LFTs, ferritin, sedimentation rate in 3 months. Previously noted to have an abnormal ferritin level in the setting of gout. Iron and saturations were normal. Viral markers have been negative.  No further rectal bleeding. Occasional left-sided abdominal pain resolved with bowel movement. Heartburn about 2 times per week.  Was on metformin only 500mg  per day and recently increased to 1000mg  BID (just started within the last 1-2 weeks. Pravachol chronically. Vicodin about 2 times per day. One year ago, took a lot of daily Tylenol. Ibuprofen less than once per week.    Current Outpatient Prescriptions  Medication Sig Dispense Refill  . amLODipine-olmesartan (AZOR) 10-40 MG per tablet Take 1 tablet by mouth daily.  30 tablet  5  . colchicine (COLCRYS) 0.6 MG tablet Take 1  tablet (0.6 mg total) by mouth 2 (two) times daily.  180 tablet  3  . doxazosin (CARDURA) 8 MG tablet TAKE ONE TABLET BY MOUTH ONCE DAILY  30 tablet  6  . fish oil-omega-3 fatty acids 1000 MG capsule Take 1 g by mouth daily.      Marland Kitchen glipiZIDE (GLUCOTROL XL) 10 MG 24 hr tablet Take 1 tablet (10 mg total) by mouth daily.  90 tablet  3  . hydrALAZINE (APRESOLINE) 25 MG tablet Take 25 mg by mouth daily.      . hydrochlorothiazide (HYDRODIURIL) 25 MG tablet Take 25 mg by mouth daily.      Marland Kitchen HYDROcodone-acetaminophen (NORCO/VICODIN) 5-325 MG per tablet Take 1 tablet by mouth every 6 (six) hours as needed for pain.      Marland Kitchen ibuprofen (ADVIL,MOTRIN) 200 MG tablet Take 800 mg by mouth every 6 (six) hours as needed for pain (twice a week).       . isosorbide mononitrate (IMDUR) 30 MG 24 hr tablet Take 30 mg by mouth daily.      . metFORMIN (GLUCOPHAGE) 500 MG tablet Take 2 tablets (1,000 mg total) by mouth 2 (two) times daily with a meal.  180 tablet  3  . methocarbamol (ROBAXIN) 500 MG tablet Take 1,000 mg by mouth 4 (four) times daily as needed (muscle spasm/pain).      . metoprolol (LOPRESSOR) 100 MG tablet Take 100 mg by mouth 2 (two) times daily.      . pravastatin (PRAVACHOL) 20 MG tablet TAKE ONE TABLET BY MOUTH ONCE DAILY  30  tablet  6  . omeprazole (PRILOSEC OTC) 20 MG tablet Take 1 tablet (20 mg total) by mouth daily.  30 tablet  0   No current facility-administered medications for this visit.    Allergies as of 06/12/2012 - Review Complete 06/12/2012  Allergen Reaction Noted  . Clonidine derivatives Other (See Comments) 05/10/2010  . Oxycodone Nausea And Vomiting 09/01/2010    ROS:  General: Negative for anorexia, weight loss, fever, chills, fatigue, weakness. ENT: Negative for hoarseness, difficulty swallowing , nasal congestion. CV: Negative for chest pain, angina, palpitations, dyspnea on exertion, peripheral edema.  Respiratory: Negative for dyspnea at rest, dyspnea on exertion, cough,  sputum, wheezing.  GI: See history of present illness. GU:  Negative for dysuria, hematuria, urinary incontinence, urinary frequency, nocturnal urination.  Endo: Negative for unusual weight change.    Physical Examination:   BP 130/80  Pulse 71  Temp(Src) 97.4 F (36.3 C) (Oral)  Ht 5\' 9"  (1.753 m)  Wt 324 lb (146.965 kg)  BMI 47.82 kg/m2  General: Well-nourished, well-developed in no acute distress. Accompanied by wife. Eyes: No icterus. Mouth: Oropharyngeal mucosa moist and pink , no lesions erythema or exudate. Lungs: Clear to auscultation bilaterally.  Heart: Regular rate and rhythm, no murmurs rubs or gallops.  Abdomen: Bowel sounds are normal, nontender, nondistended, no hepatosplenomegaly or masses, no abdominal bruits or hernia , no rebound or guarding.   Extremities: No lower extremity edema. No clubbing or deformities. Neuro: Alert and oriented x 4   Skin: Warm and dry, no jaundice.   Psych: Alert and cooperative, normal mood and affect.

## 2012-06-12 NOTE — Progress Notes (Signed)
Cc PCP 

## 2012-06-12 NOTE — Assessment & Plan Note (Signed)
Improved. 2 of the 4 episodes this month are associated with relative hypoglycemia. He had H. pylori gastritis on endoscopy. 6-8 week course of Prilosec OTC daily. Call with persistent symptoms.

## 2012-06-16 ENCOUNTER — Other Ambulatory Visit: Payer: Self-pay | Admitting: Cardiology

## 2012-07-11 ENCOUNTER — Other Ambulatory Visit (INDEPENDENT_AMBULATORY_CARE_PROVIDER_SITE_OTHER): Payer: Self-pay

## 2012-07-11 ENCOUNTER — Encounter: Payer: Self-pay | Admitting: Internal Medicine

## 2012-07-11 ENCOUNTER — Ambulatory Visit (INDEPENDENT_AMBULATORY_CARE_PROVIDER_SITE_OTHER): Payer: Self-pay | Admitting: Internal Medicine

## 2012-07-11 VITALS — BP 160/100 | HR 63 | Temp 98.2°F | Ht 69.0 in | Wt 318.2 lb

## 2012-07-11 DIAGNOSIS — E119 Type 2 diabetes mellitus without complications: Secondary | ICD-10-CM

## 2012-07-11 DIAGNOSIS — E785 Hyperlipidemia, unspecified: Secondary | ICD-10-CM

## 2012-07-11 DIAGNOSIS — G4730 Sleep apnea, unspecified: Secondary | ICD-10-CM

## 2012-07-11 DIAGNOSIS — IMO0001 Reserved for inherently not codable concepts without codable children: Secondary | ICD-10-CM

## 2012-07-11 DIAGNOSIS — I10 Essential (primary) hypertension: Secondary | ICD-10-CM

## 2012-07-11 LAB — BASIC METABOLIC PANEL
BUN: 10 mg/dL (ref 6–23)
CO2: 30 mEq/L (ref 19–32)
Calcium: 9.7 mg/dL (ref 8.4–10.5)
Chloride: 101 mEq/L (ref 96–112)
Creatinine, Ser: 0.9 mg/dL (ref 0.4–1.5)
GFR: 113.9 mL/min (ref >60.00)
Glucose, Bld: 96 mg/dL (ref 70–99)
Potassium: 3.8 mEq/L (ref 3.5–5.1)
Sodium: 138 mEq/L (ref 135–145)

## 2012-07-11 LAB — LIPID PANEL
Cholesterol: 178 mg/dL (ref 0–200)
HDL: 47 mg/dL (ref >39.00)
LDL Cholesterol: 114 mg/dL — ABNORMAL HIGH (ref 0–99)
Total CHOL/HDL Ratio: 4
Triglycerides: 86 mg/dL (ref 0.0–149.0)
VLDL: 17.2 mg/dL (ref 0.0–40.0)

## 2012-07-11 LAB — HEMOGLOBIN A1C: Hgb A1c MFr Bld: 5.7 % (ref 4.6–6.5)

## 2012-07-11 MED ORDER — VALSARTAN-HYDROCHLOROTHIAZIDE 320-25 MG PO TABS: 1.0000 | ORAL_TABLET | Freq: Every day | ORAL | Status: DC

## 2012-07-11 MED ORDER — AMLODIPINE BESYLATE 10 MG PO TABS: 10.0000 mg | ORAL_TABLET | Freq: Every day | ORAL | Status: DC

## 2012-07-11 NOTE — Patient Instructions (Signed)
OK to stop the Azor OK to stop the fluid pill (hydrochlorothiazide) Please take all new medication as prescribed - the amlodipine 10 mg per day, AND the Diovan HCT 320-25 mg - both generic Please go to the LAB in the Basement (turn left off the elevator) for the tests to be done today You will be contacted by phone if any changes need to be made immediately.  Otherwise, you will receive a letter about your results with an explanation, but please check with MyChart first. Please wear your CPAP every night  Please return in 6 months, or sooner if needed

## 2012-07-12 NOTE — Assessment & Plan Note (Signed)
stable overall by history and exam, recent data reviewed with pt, and pt to continue medical treatment as before,  to f/u any worsening symptoms or concerns Lab Results  Component Value Date   LDLCALC 114* 07/11/2012

## 2012-07-12 NOTE — Assessment & Plan Note (Signed)
stable overall by history and exam, recent data reviewed with pt, and pt to continue medical treatment as before,  to f/u any worsening symptoms or concerns Lab Results  Component Value Date   HGBA1C 5.7 07/11/2012

## 2012-07-12 NOTE — Assessment & Plan Note (Signed)
Out of BP med, will need change to generic meds - d/c azor , to start diovan hct and amlod asd,  to f/u any worsening symptoms or concerns

## 2012-07-12 NOTE — Progress Notes (Signed)
Subjective:    Patient ID: Nicholas Turner, male    DOB: [birth year], 42 y.o.   MRN: [id number]  HPI  Here to f/u; overall doing ok,  Pt denies chest pain, increased sob or doe, wheezing, orthopnea, PND, increased LE swelling, palpitations, dizziness or syncope.  Pt denies polydipsia, polyuria, or low sugar symptoms such as weakness or confusion improved with po intake.  Pt denies new neurological symptoms such as new headache, or facial or extremity weakness or numbness.   Pt states overall good compliance with meds, has been trying to follow lower cholesterol, diabetic diet, with wt overall stable,  but little exercise however.  Has lost 16 lbs in last 6 mo intentional, unfort has not been able to take azor for the past wk as ran out and now too expensive.  Has not been using CPAP recently, has worsening daytime somnolence. Past Medical History  Diagnosis Date  . Knee pain     feet   . Gout   . Hypertension   . Diabetes mellitus   . Palpitation   . Generalized headaches   . UTI (lower urinary tract infection)   . DM (diabetes mellitus) 09/01/2010       . Obesity (BMI 30-39.9) 09/01/2010  . Gout 11/26/2011  . HYPERTENSION, UNCONTROLLED 09/09/2009    Qualifier: Diagnosis of  By: Via LPN, Larita Fife    . SLEEP APNEA 09/09/2009    Qualifier: Diagnosis of  By: Via LPN, Larita Fife    . Hyperlipidemia   . Chronic neck pain   . Arthritis   . Kidney stones    Past Surgical History  Procedure Laterality Date  . Kidney stone surgery      post ureteral stent and status post lithotripsy (10-15 yrs Ago)  . Colonoscopy with esophagogastroduodenoscopy (egd) N/A 05/01/2012    WUJ:[id number] esophagus. Small hiatal hernia. 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 pylori infection-status post biopsy (H.Pylori +). Minimally excoriated anal canal tissue. No hemorrhoids. Pancolonic diverticulosis. 8 mm polyp distal to the ICV, tubular adenoma.    reports that he has never smoked. He has never  used smokeless tobacco. He reports that he does not drink alcohol or use illicit drugs. family history includes Alcohol abuse in his other; Arthritis in his mother and others; Asthma in his other; Breast cancer in his maternal aunt; Cancer in his other; Colon polyps in his mother and sister; Diabetes in his other; Heart disease in his others; Hypertension in his others; Lung cancer in his maternal uncle; and Sudden death in his other.  There is no history of Colon cancer. Allergies  Allergen Reactions  . Clonidine Derivatives Other (See Comments)    Welps  . Oxycodone Nausea And Vomiting   Current Outpatient Prescriptions on File Prior to Visit  Medication Sig Dispense Refill  . colchicine (COLCRYS) 0.6 MG tablet Take 1 tablet (0.6 mg total) by mouth 2 (two) times daily.  180 tablet  3  . doxazosin (CARDURA) 8 MG tablet TAKE ONE TABLET BY MOUTH ONCE DAILY  30 tablet  6  . fish oil-omega-3 fatty acids 1000 MG capsule Take 1 g by mouth daily.      Marland Kitchen glipiZIDE (GLUCOTROL XL) 10 MG 24 hr tablet Take 1 tablet (10 mg total) by mouth daily.  90 tablet  3  . hydrALAZINE (APRESOLINE) 25 MG tablet Take 25 mg by mouth daily.      Marland Kitchen HYDROcodone-acetaminophen (NORCO/VICODIN) 5-325 MG per tablet Take 1 tablet by mouth every  6 (six) hours as needed for pain.      Marland Kitchen ibuprofen (ADVIL,MOTRIN) 200 MG tablet Take 800 mg by mouth every 6 (six) hours as needed for pain (twice a week).       . isosorbide mononitrate (IMDUR) 30 MG 24 hr tablet Take 30 mg by mouth daily.      . metFORMIN (GLUCOPHAGE) 500 MG tablet Take 2 tablets (1,000 mg total) by mouth 2 (two) times daily with a meal.  180 tablet  3  . methocarbamol (ROBAXIN) 500 MG tablet Take 1,000 mg by mouth 4 (four) times daily as needed (muscle spasm/pain).      . metoprolol (LOPRESSOR) 100 MG tablet Take 100 mg by mouth 2 (two) times daily.      . metoprolol (LOPRESSOR) 100 MG tablet TAKE ONE TABLET BY MOUTH TWICE DAILY --  **PATIENT  IS  OVERDUE  FOR  AN   OFFICE  VISIT  AND  NEEDS  TO  SCHEDULE  PER  MD**  180 tablet  0  . omeprazole (PRILOSEC OTC) 20 MG tablet Take 1 tablet (20 mg total) by mouth daily.  30 tablet  0  . pravastatin (PRAVACHOL) 20 MG tablet TAKE ONE TABLET BY MOUTH ONCE DAILY  30 tablet  6   No current facility-administered medications on file prior to visit.   Review of Systems  Constitutional: Negative for unexpected weight change, or unusual diaphoresis  HENT: Negative for tinnitus.   Eyes: Negative for photophobia and visual disturbance.  Respiratory: Negative for choking and stridor.   Gastrointestinal: Negative for vomiting and blood in stool.  Genitourinary: Negative for hematuria and decreased urine volume.  Musculoskeletal: Negative for acute joint swelling Skin: Negative for color change and wound.  Neurological: Negative for tremors and numbness other than noted  Psychiatric/Behavioral: Negative for decreased concentration or  hyperactivity.       Objective:   Physical Exam BP 160/100  Pulse 63  Temp(Src) 98.2 F (36.8 C) (Oral)  Ht 5\' 9"  (1.753 m)  Wt 318 lb 4 oz (144.357 kg)  BMI 46.98 kg/m2  SpO2 95% VS noted, not ill appearing Constitutional: Pt appears well-developed and well-nourished.  HENT: Head: NCAT.  Right Ear: External ear normal.  Left Ear: External ear normal.  Eyes: Conjunctivae and EOM are normal. Pupils are equal, round, and reactive to light.  Neck: Normal range of motion. Neck supple.  Cardiovascular: Normal rate and regular rhythm.   Pulmonary/Chest: Effort normal and breath sounds normal.  Neurological: Pt is alert. Not confused  Skin: Skin is warm. No erythema.  Psychiatric: Pt behavior is normal. Thought content normal.     Assessment & Plan:

## 2012-07-12 NOTE — Assessment & Plan Note (Signed)
D/w pt import of compliance and relation to worsening BP and risk of stroke, asked to re-start cpap

## 2012-07-23 IMAGING — CR DG KNEE COMPLETE 4+V*L*
4 series · 4 of 4 positions shown · non-contrast
Comparison: None.

CLINICAL DATA: Pain and swelling, knot in the posterior left knee,
history of gout

LEFT KNEE - COMPLETE 4+ VIEW

[view not recorded (1 of 4)]
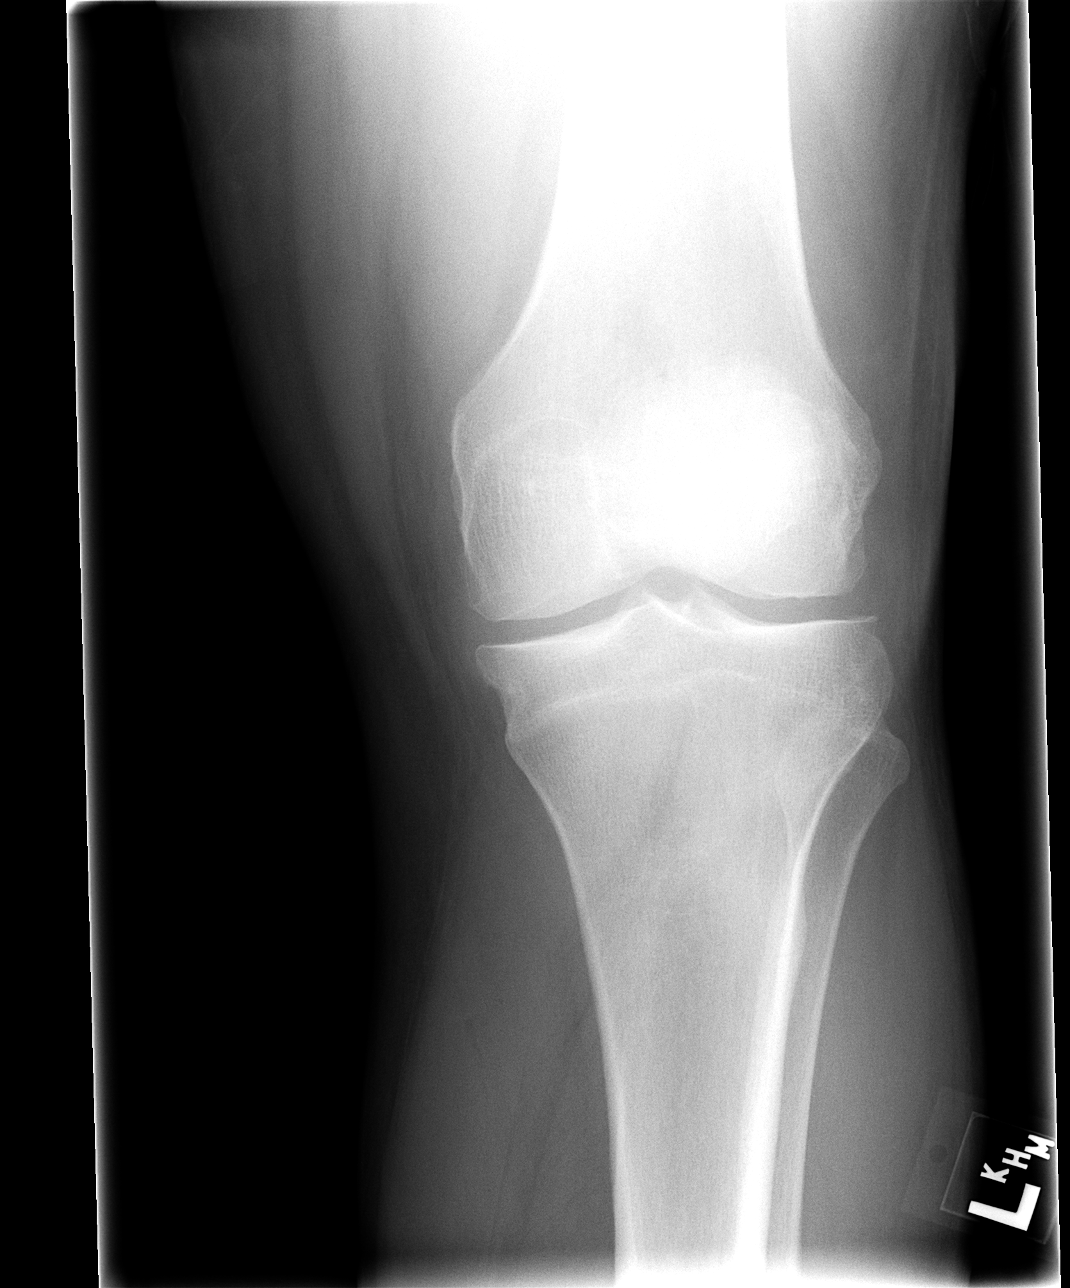

[view not recorded (2 of 4)]
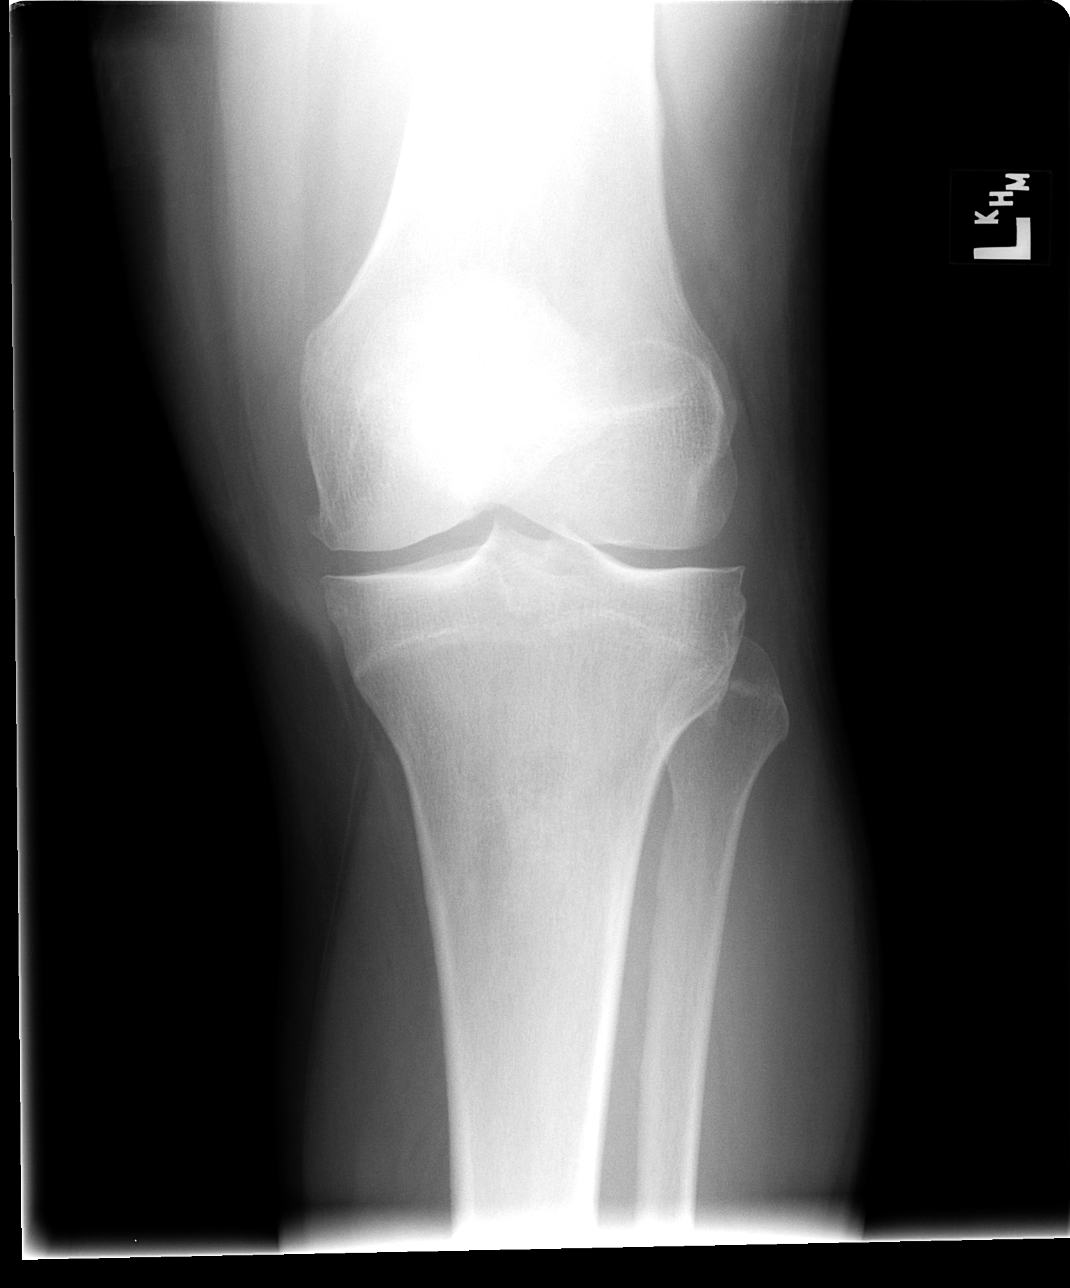

[view not recorded (3 of 4)]
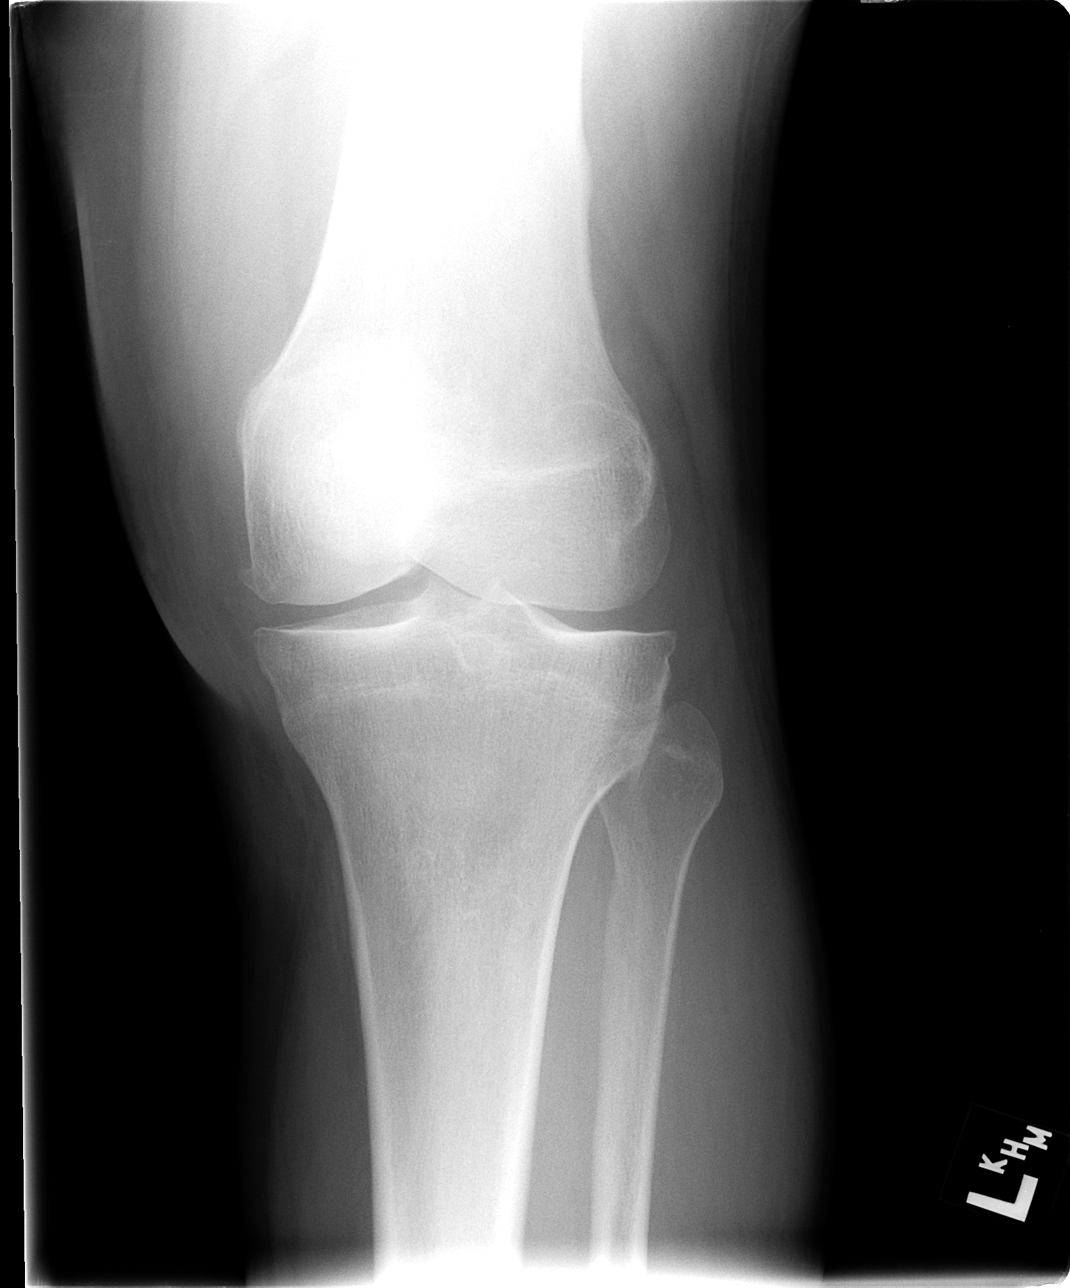

[view not recorded (4 of 4)]
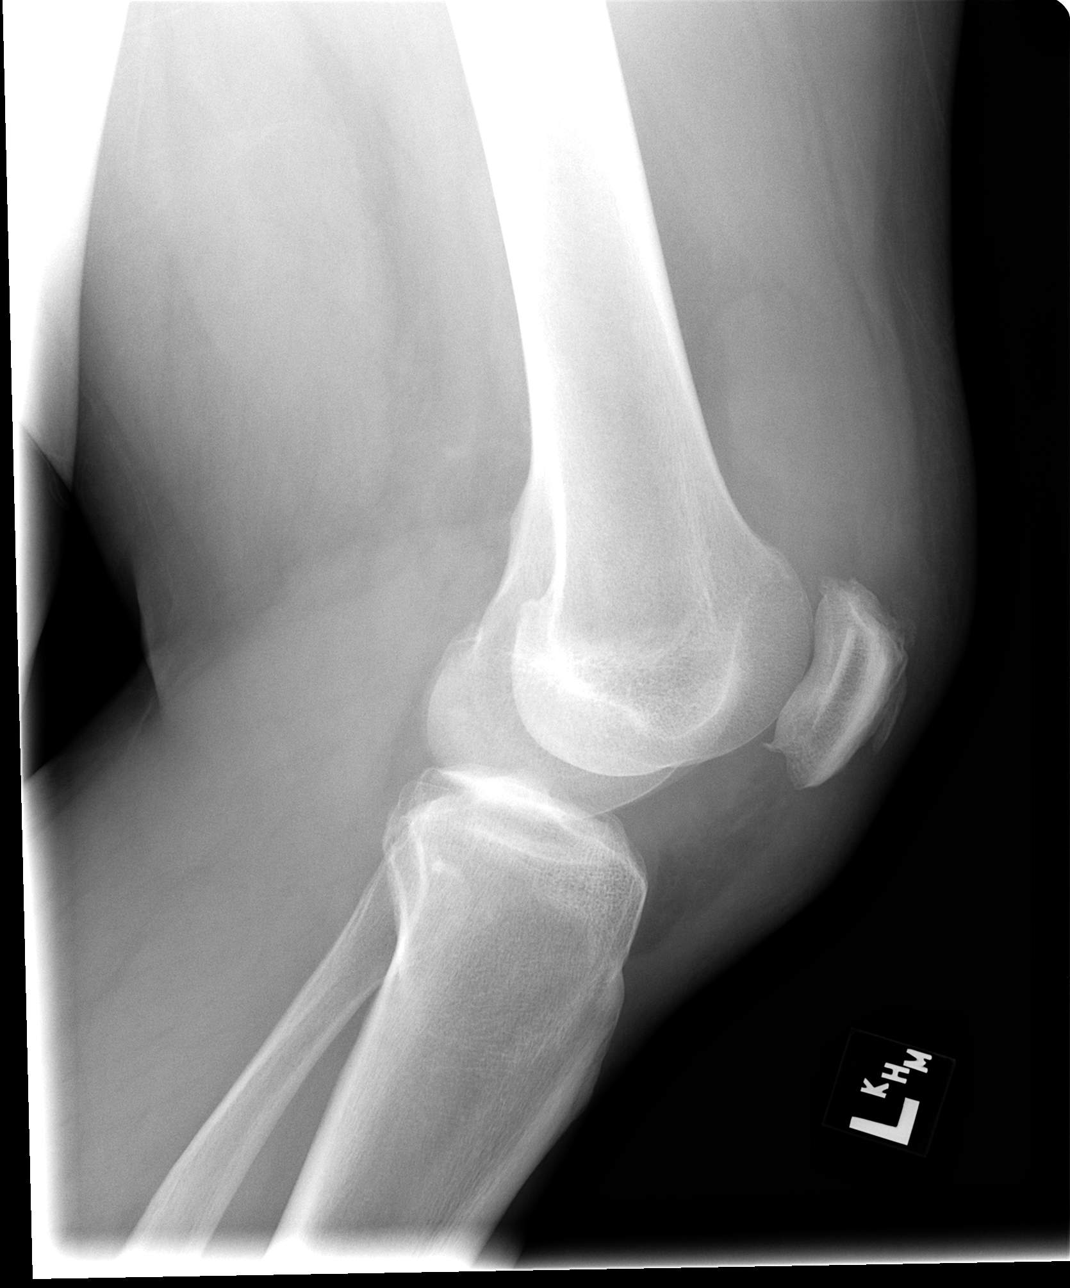

[4 of 4 positions shown; findings below may reference images not displayed]

FINDINGS: No fracture or dislocation.  Mild tricompartmental
degenerative change with joint space loss, subchondral sclerosis
and osteophytosis.  Moderate sized joint effusion.  No evidence of
chondrocalcinosis.  Regional soft tissues are normal with special
attention paid to the posterior knee.
IMPRESSION: 1.  Mild tricompartmental degenerative change.

2.  Moderate size joint effusion.

## 2012-09-04 ENCOUNTER — Other Ambulatory Visit: Payer: Self-pay

## 2012-09-04 MED ORDER — HYDRALAZINE HCL 25 MG PO TABS: 25.0000 mg | ORAL_TABLET | Freq: Every day | ORAL | Status: DC

## 2012-09-04 MED ORDER — ISOSORBIDE MONONITRATE ER 30 MG PO TB24: 30.0000 mg | ORAL_TABLET | Freq: Every day | ORAL | Status: DC

## 2012-09-08 ENCOUNTER — Encounter: Payer: Self-pay | Admitting: Gastroenterology

## 2012-09-08 ENCOUNTER — Ambulatory Visit (INDEPENDENT_AMBULATORY_CARE_PROVIDER_SITE_OTHER): Payer: Self-pay | Admitting: Gastroenterology

## 2012-09-08 VITALS — BP 152/100 | HR 59 | Temp 97.8°F | Ht 69.0 in | Wt 314.8 lb

## 2012-09-08 DIAGNOSIS — R945 Abnormal results of liver function studies: Secondary | ICD-10-CM

## 2012-09-08 DIAGNOSIS — K921 Melena: Secondary | ICD-10-CM

## 2012-09-08 DIAGNOSIS — K769 Liver disease, unspecified: Secondary | ICD-10-CM

## 2012-09-08 MED ORDER — HYDROCORTISONE 2.5 % RE CREA: TOPICAL_CREAM | Freq: Two times a day (BID) | RECTAL | Status: DC

## 2012-09-08 MED ORDER — DICYCLOMINE HCL 10 MG PO CAPS: 10.0000 mg | ORAL_CAPSULE | ORAL | Status: DC | PRN

## 2012-09-08 NOTE — Assessment & Plan Note (Signed)
History of excoriated anal canal on recent colonoscopy this year. No noted hemorrhoids seen at time of colonoscopy. He did have pancolonic diverticulosis. Cannot exclude recent diverticular bleed, however I suspect he has recurrent bleeding from anorectal irritation in the setting of frequent stooling.  Warnings signs discussed with patient to include recurrent bleeding of large volume, passing blood clots, dizziness, melena.  Otherwise, trial of Anusol cream twice a day for 2 weeks. Add dicyclomine up to 4 times daily as needed to decrease stool frequency. Hold for constipation. If needed, to consider switching diabetic regimen however given recent decline in hemoglobin A1c, would hate to have to change his current therapy.

## 2012-09-08 NOTE — Assessment & Plan Note (Signed)
Due for lab work at this time. Congratulated him on his weight loss and better glycemic control. He will try to lose an additional 10 pounds in the next 3 months.

## 2012-09-08 NOTE — Progress Notes (Signed)
Primary Care Physician: Oliver Barre, MD  Primary Gastroenterologist:  Roetta Sessions, MD   Chief Complaint  Patient presents with  . Rectal Bleeding    HPI: Nicholas Turner is a 42 y.o. male here for rectal bleeding. EGD and colonoscopy showed H. pylori gastritis, colonic diverticulosis, single tubular adenoma removed from distal the ICV, excoriated anal canal without hemorrhoids. He completed Pylera therapy for H. Pylori.  He has a history of elevated alkaline phosphatase, AST, ALT along with fatty liver noted on prior ultrasound. He is due repeat LFTs. Previously had abnormal ferritin level in the setting of gout. Viral markers have been negative. Iron saturations were negative.  He brought his hemoglobin A1c down from 9.6, 5 months ago to 5.7 currently. Weight down 20 pounds since March, intentional.  July 15-28th some brbpr. At first it was on the toilet tissue. Then more in the stool and water. Then with blood clots. Stools were formed but not hard. No straining. No rectal pain. No rectal bleeding since. No abdominal pain. Intermittent heartburn. Not taking ibuprofen every day. Typically has anywhere from 5-10 stools per day. Notes the number of stools increased when his metformin was doubled. Denies diarrhea. Also on culture seen for gout, currently taking once a day.     Current Outpatient Prescriptions  Medication Sig Dispense Refill  . amLODipine (NORVASC) 10 MG tablet Take 1 tablet (10 mg total) by mouth daily.  90 tablet  3  . colchicine (COLCRYS) 0.6 MG tablet Take 1 tablet (0.6 mg total) by mouth 2 (two) times daily.  180 tablet  3  . doxazosin (CARDURA) 8 MG tablet TAKE ONE TABLET BY MOUTH ONCE DAILY  30 tablet  6  . Fiber, Guar Gum, CHEW Chew by mouth 2 (two) times daily.      . fish oil-omega-3 fatty acids 1000 MG capsule Take 1 g by mouth daily.      Marland Kitchen glipiZIDE (GLUCOTROL XL) 10 MG 24 hr tablet Take 1 tablet (10 mg total) by mouth daily.  90 tablet  3  . hydrALAZINE  (APRESOLINE) 25 MG tablet Take 1 tablet (25 mg total) by mouth daily.  30 tablet  4  . HYDROcodone-acetaminophen (NORCO/VICODIN) 5-325 MG per tablet Take 1 tablet by mouth every 6 (six) hours as needed for pain.      Marland Kitchen ibuprofen (ADVIL,MOTRIN) 200 MG tablet Take 800 mg by mouth every 6 (six) hours as needed for pain (twice a week).       . isosorbide mononitrate (IMDUR) 30 MG 24 hr tablet Take 1 tablet (30 mg total) by mouth daily.  30 tablet  4  . metFORMIN (GLUCOPHAGE) 500 MG tablet Take 2 tablets (1,000 mg total) by mouth 2 (two) times daily with a meal.  180 tablet  3  . methocarbamol (ROBAXIN) 500 MG tablet Take 1,000 mg by mouth 4 (four) times daily as needed (muscle spasm/pain).      . metoprolol (LOPRESSOR) 100 MG tablet Take 100 mg by mouth 2 (two) times daily.      . metoprolol (LOPRESSOR) 100 MG tablet TAKE ONE TABLET BY MOUTH TWICE DAILY --  **PATIENT  IS  OVERDUE  FOR  AN  OFFICE  VISIT  AND  NEEDS  TO  SCHEDULE  PER  MD**  180 tablet  0  . omeprazole (PRILOSEC OTC) 20 MG tablet Take 1 tablet (20 mg total) by mouth daily.  30 tablet  0  . pravastatin (PRAVACHOL) 20 MG tablet TAKE ONE TABLET  BY MOUTH ONCE DAILY  30 tablet  6  . valsartan-hydrochlorothiazide (DIOVAN HCT) 320-25 MG per tablet Take 1 tablet by mouth daily.  90 tablet  3   No current facility-administered medications for this visit.    Allergies as of 09/08/2012 - Review Complete 09/08/2012  Allergen Reaction Noted  . Clonidine derivatives Other (See Comments) 05/10/2010  . Oxycodone Nausea And Vomiting 09/01/2010    ROS:  General: Negative for anorexia, weight loss, fever, chills, fatigue, weakness. ENT: Negative for hoarseness, difficulty swallowing , nasal congestion. CV: Negative for chest pain, angina, palpitations, dyspnea on exertion, peripheral edema.  Respiratory: Negative for dyspnea at rest, dyspnea on exertion, cough, sputum, wheezing.  GI: See history of present illness. GU:  Negative for dysuria,  hematuria, urinary incontinence, urinary frequency, nocturnal urination.  Endo: Negative for unusual weight change.    Physical Examination:   BP 152/100  Pulse 59  Temp(Src) 97.8 F (36.6 C) (Oral)  Ht 5\' 9"  (1.753 m)  Wt 314 lb 12.8 oz (142.792 kg)  BMI 46.47 kg/m2  General: Well-nourished, well-developed in no acute distress.  Eyes: No icterus. Mouth: Oropharyngeal mucosa moist and pink , no lesions erythema or exudate. Lungs: Clear to auscultation bilaterally.  Heart: Regular rate and rhythm, no murmurs rubs or gallops.  Abdomen: Bowel sounds are normal, nontender, nondistended, no hepatosplenomegaly or masses, no abdominal bruits or hernia , no rebound or guarding.   Rectal: No external abnormalities. Nontender exam. No rectal masses. Extremities: No lower extremity edema. No clubbing or deformities. Neuro: Alert and oriented x 4   Skin: Warm and dry, no jaundice.   Psych: Alert and cooperative, normal mood and affect.

## 2012-09-08 NOTE — Patient Instructions (Addendum)
1. Suspect rectal bleeding secondary to frequent stools and anorectal irritation. 2. Please start Anusol cream, apply rectally twice a day for 2 weeks. 3. Try dicyclomine 1 capsule up to 4 times daily to decrease frequent stools. Hold for constipation. 4. You're due for your blood work. 5. Congratulations on your weight loss.

## 2012-09-08 NOTE — Progress Notes (Signed)
CC'd to PCP 

## 2012-09-09 LAB — HEPATIC FUNCTION PANEL
ALT: 20 U/L (ref 0–53)
AST: 18 U/L (ref 0–37)
Albumin: 4 g/dL (ref 3.5–5.2)
Alkaline Phosphatase: 76 U/L (ref 39–117)
Bilirubin, Direct: 0.1 mg/dL (ref 0.0–0.3)
Indirect Bilirubin: 0.3 mg/dL (ref 0.0–0.9)
Total Bilirubin: 0.4 mg/dL (ref 0.3–1.2)
Total Protein: 7.4 g/dL (ref 6.0–8.3)

## 2012-09-09 LAB — IRON AND TIBC
%SAT: 33 % (ref 20–55)
Iron: 102 ug/dL (ref 42–165)
TIBC: 305 ug/dL (ref 215–435)
UIBC: 203 ug/dL (ref 125–400)

## 2012-09-09 LAB — FERRITIN: Ferritin: 448 ng/mL — ABNORMAL HIGH (ref 22–322)

## 2012-10-03 NOTE — Progress Notes (Signed)
Quick Note:  LFTs are normal!!!! Keep up the good work with weight loss. Repeat LFTs six months. OV six months. ______

## 2012-10-07 ENCOUNTER — Other Ambulatory Visit: Payer: Self-pay | Admitting: Gastroenterology

## 2012-10-07 DIAGNOSIS — R945 Abnormal results of liver function studies: Secondary | ICD-10-CM

## 2012-10-09 ENCOUNTER — Emergency Department (HOSPITAL_COMMUNITY)
Admission: EM | Admit: 2012-10-09 | Discharge: 2012-10-09 | Disposition: A | Discharge status: Home / Self Care | Payer: Self-pay | Attending: Emergency Medicine | Admitting: Emergency Medicine

## 2012-10-09 ENCOUNTER — Encounter (HOSPITAL_COMMUNITY): Payer: Self-pay | Admitting: *Deleted

## 2012-10-09 DIAGNOSIS — Z79899 Other long term (current) drug therapy: Secondary | ICD-10-CM | POA: Insufficient documentation

## 2012-10-09 DIAGNOSIS — Z87440 Personal history of urinary (tract) infections: Secondary | ICD-10-CM | POA: Insufficient documentation

## 2012-10-09 DIAGNOSIS — E785 Hyperlipidemia, unspecified: Secondary | ICD-10-CM | POA: Insufficient documentation

## 2012-10-09 DIAGNOSIS — Z8739 Personal history of other diseases of the musculoskeletal system and connective tissue: Secondary | ICD-10-CM | POA: Insufficient documentation

## 2012-10-09 DIAGNOSIS — E119 Type 2 diabetes mellitus without complications: Secondary | ICD-10-CM | POA: Insufficient documentation

## 2012-10-09 DIAGNOSIS — IMO0002 Reserved for concepts with insufficient information to code with codable children: Secondary | ICD-10-CM | POA: Insufficient documentation

## 2012-10-09 DIAGNOSIS — M109 Gout, unspecified: Secondary | ICD-10-CM | POA: Insufficient documentation

## 2012-10-09 DIAGNOSIS — I10 Essential (primary) hypertension: Secondary | ICD-10-CM | POA: Insufficient documentation

## 2012-10-09 DIAGNOSIS — L259 Unspecified contact dermatitis, unspecified cause: Secondary | ICD-10-CM

## 2012-10-09 DIAGNOSIS — G8929 Other chronic pain: Secondary | ICD-10-CM | POA: Insufficient documentation

## 2012-10-09 DIAGNOSIS — E669 Obesity, unspecified: Secondary | ICD-10-CM | POA: Insufficient documentation

## 2012-10-09 DIAGNOSIS — Z87442 Personal history of urinary calculi: Secondary | ICD-10-CM | POA: Insufficient documentation

## 2012-10-09 LAB — GLUCOSE, CAPILLARY: Glucose-Capillary: 160 mg/dL — ABNORMAL HIGH (ref 70–99)

## 2012-10-09 MED ORDER — DIPHENHYDRAMINE HCL 25 MG PO CAPS
50.0000 mg | ORAL_CAPSULE | Freq: Once | ORAL | Status: AC
  Administered 2012-10-09: 50 mg via ORAL
  Filled 2012-10-09: qty 2

## 2012-10-09 MED ORDER — PREDNISONE 20 MG PO TABS
40.0000 mg | ORAL_TABLET | Freq: Once | ORAL | Status: AC
  Administered 2012-10-09: 40 mg via ORAL
  Filled 2012-10-09: qty 2

## 2012-10-09 MED ORDER — PREDNISONE 20 MG PO TABS: 40.0000 mg | ORAL_TABLET | Freq: Every day | ORAL | Status: DC

## 2012-10-09 NOTE — ED Notes (Signed)
Rash to eye area with minor swelling. Pt states itching to the area.

## 2012-10-09 NOTE — ED Provider Notes (Signed)
CSN: [id number]     Arrival date & time 10/09/12  1148 History   First MD Initiated Contact with Patient 10/09/12 1248     Chief Complaint  Patient presents with  . Rash  . Hypertension   (Consider location/radiation/quality/duration/timing/severity/associated sxs/prior Treatment) HPI Comments: 42 year old male with a history of hypertension, diabetes, presents with a complaint of rash and itching to the face. He states that approximately 5 days ago he developed a feeling of itchiness and burning to his bilateral face, this was mostly around the eyes. This has been persistent over the last 2 days he has developed an erythematous rash surrounding the right eye and over the left temporal area. He does admit to being outside doing some yard work approximately 5 days ago, he has no other rashes on his body. This rash is itchy and has a burning sensation to it, he has no discharge to his eyes, no change in the color of his conjunctiva, no change in his visual acuity. The symptoms are persistent, gradually getting worse, nothing seems to make it better or worse.  Patient is a 42 y.o. male presenting with rash and hypertension. The history is provided by the patient and the spouse.  Rash Hypertension    Past Medical History  Diagnosis Date  . Knee pain     feet   . Gout   . Hypertension   . Diabetes mellitus   . Palpitation   . Generalized headaches   . UTI (lower urinary tract infection)   . DM (diabetes mellitus) 09/01/2010       . Obesity (BMI 30-39.9) 09/01/2010  . Gout 11/26/2011  . HYPERTENSION, UNCONTROLLED 09/09/2009    Qualifier: Diagnosis of  By: Via LPN, Larita Fife    . SLEEP APNEA 09/09/2009    Qualifier: Diagnosis of  By: Via LPN, Larita Fife    . Hyperlipidemia   . Chronic neck pain   . Arthritis   . Kidney stones    Past Surgical History  Procedure Laterality Date  . Kidney stone surgery      post ureteral stent and status post lithotripsy (10-15 yrs Ago)  . Colonoscopy with  esophagogastroduodenoscopy (egd) N/A 05/01/2012    [id number] esophagus. Small hiatal hernia. 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 pylori infection-status post biopsy (H.Pylori +). Minimally excoriated anal canal tissue. No hemorrhoids. Pancolonic diverticulosis. 8 mm polyp distal to the ICV, tubular adenoma. Next colonoscopy in April 2019.  Marland Kitchen Esophagogastroduodenoscopy endoscopy     Family History  Problem Relation Age of Onset  . Arthritis Mother   . Arthritis Other   . Heart disease Other   . Hypertension Other   . Sudden death Other   . Arthritis Other   . Heart disease Other   . Hypertension Other   . Alcohol abuse Other   . Cancer Other   . Asthma Other     breast cancer  . Heart disease Other   . Hypertension Other   . Diabetes Other   . Colon cancer Neg Hx   . Lung cancer Maternal Uncle   . Breast cancer Maternal Aunt   . Colon polyps Sister     age 80  . Colon polyps Mother    History  Substance Use Topics  . Smoking status: Never Smoker   . Smokeless tobacco: Never Used  . Alcohol Use: No    Review of Systems  Skin: Positive for rash.  All other systems reviewed and are negative.  Allergies  Clonidine derivatives and Oxycodone  Home Medications   Current Outpatient Rx  Name  Route  Sig  Dispense  Refill  . amLODipine (NORVASC) 10 MG tablet   Oral   Take 10 mg by mouth daily.         . colchicine (COLCRYS) 0.6 MG tablet   Oral   Take 1 tablet (0.6 mg total) by mouth 2 (two) times daily.   180 tablet   3   . diphenhydrAMINE (BENADRYL) 25 MG tablet   Oral   Take 25 mg by mouth every 6 (six) hours as needed for itching or allergies.         Marland Kitchen doxazosin (CARDURA) 8 MG tablet   Oral   Take 8 mg by mouth at bedtime.         . Fiber, Guar Gum, CHEW   Oral   Chew by mouth 2 (two) times daily.         . fish oil-omega-3 fatty acids 1000 MG capsule   Oral   Take 1 g by mouth daily.         Marland Kitchen glipiZIDE  (GLUCOTROL XL) 10 MG 24 hr tablet   Oral   Take 1 tablet (10 mg total) by mouth daily.   90 tablet   3   . hydrALAZINE (APRESOLINE) 25 MG tablet   Oral   Take 1 tablet (25 mg total) by mouth daily.   30 tablet   4   . hydrochlorothiazide (HYDRODIURIL) 25 MG tablet   Oral   Take 25 mg by mouth daily.         Marland Kitchen ibuprofen (ADVIL,MOTRIN) 200 MG tablet   Oral   Take 800 mg by mouth every 6 (six) hours as needed for pain (twice a week).          . isosorbide mononitrate (IMDUR) 30 MG 24 hr tablet   Oral   Take 1 tablet (30 mg total) by mouth daily.   30 tablet   4   . metFORMIN (GLUCOPHAGE) 500 MG tablet   Oral   Take 2 tablets (1,000 mg total) by mouth 2 (two) times daily with a meal.   180 tablet   3   . methocarbamol (ROBAXIN) 500 MG tablet   Oral   Take 1,000 mg by mouth 4 (four) times daily as needed (muscle spasm/pain).         . metoprolol (LOPRESSOR) 100 MG tablet   Oral   Take 100 mg by mouth 2 (two) times daily.         . pravastatin (PRAVACHOL) 20 MG tablet   Oral   Take 20 mg by mouth at bedtime.         . predniSONE (DELTASONE) 20 MG tablet   Oral   Take 2 tablets (40 mg total) by mouth daily.   10 tablet   0    BP 155/105  Pulse 63  Temp(Src) 98.3 F (36.8 C) (Oral)  Resp 17  SpO2 99% Physical Exam  Nursing note and vitals reviewed. Constitutional: He appears well-developed and well-nourished. No distress.  HENT:  Head: Normocephalic and atraumatic.  Mouth/Throat: Oropharynx is clear and moist. No oropharyngeal exudate.  Eyes: Conjunctivae and EOM are normal. Pupils are equal, round, and reactive to light. Right eye exhibits no discharge. Left eye exhibits no discharge. No scleral icterus.  Bilateral conjunctiva and pupillary exams are normal, the optic discs are clear bilaterally with crisp margins. There is periorbital erythema  right greater than left, the left facial rash is more in the temporal area whereas the right facial rash is  periorbital mostly medial to the eye on the right. The skin is red and appears consistent with a contact dermatitis. There is no petechiae or purpura  Neck: Normal range of motion. Neck supple. No JVD present. No thyromegaly present.  Cardiovascular: Normal rate, regular rhythm, normal heart sounds and intact distal pulses.  Exam reveals no gallop and no friction rub.   No murmur heard. Pulmonary/Chest: Effort normal and breath sounds normal. No respiratory distress. He has no wheezes. He has no rales.  Abdominal: Soft. Bowel sounds are normal. He exhibits no distension and no mass. There is no tenderness.  Musculoskeletal: Normal range of motion. He exhibits no edema and no tenderness.  Lymphadenopathy:    He has no cervical adenopathy.  Neurological: He is alert. Coordination normal.  Skin: Skin is warm and dry. No rash noted. No erythema.  Psychiatric: He has a normal mood and affect. His behavior is normal.    ED Course  Procedures (including critical care time) Labs Review Labs Reviewed  GLUCOSE, CAPILLARY - Abnormal; Notable for the following:    Glucose-Capillary 160 (*)    All other components within normal limits   Imaging Review No results found.  MDM   1. Contact dermatitis    The patient was found also to be severely hypertensive, he states that he is very good at taking his medications. He is unsure if he has any topical exposures but after going through an exhaustive list of both medications, topical exposures or other insect bites etc. he is unable to come up with any answers as to possible exposures. This could be poison ivy, I doubt that this is zoster as there is bilateral rash across his the midline. I will treat him with antihistamines and oral steroid, recheck blood pressure. He does not have any signs of clinical hypertension at this time.  Filed Vitals:   10/09/12 1206 10/09/12 1210 10/09/12 1300 10/09/12 1302  BP: 196/118 208/118  155/105  Pulse: 60  63    Temp: 98.3 F (36.8 C)     TempSrc: Oral     Resp: 16  17   SpO2: 96%  99%    Meds given in ED:  Medications  predniSONE (DELTASONE) tablet 40 mg (40 mg Oral Given 10/09/12 1313)  diphenhydrAMINE (BENADRYL) capsule 50 mg (50 mg Oral Given 10/09/12 1313)    New Prescriptions   PREDNISONE (DELTASONE) 20 MG TABLET    Take 2 tablets (40 mg total) by mouth daily.      Vida Roller, MD 10/09/12 [phone]

## 2012-10-09 NOTE — Progress Notes (Signed)
ED/CM noted patient did not have health insurance and/or PCP listed in the computer.  Patient was given the [place] resource handout with information on the clinics, food pantries, and the handout for new health insurance sign-up.  Patient expressed appreciation for this. 

## 2012-10-15 ENCOUNTER — Other Ambulatory Visit: Payer: Self-pay | Admitting: *Deleted

## 2012-10-15 MED ORDER — METOPROLOL TARTRATE 100 MG PO TABS: 100.0000 mg | ORAL_TABLET | Freq: Two times a day (BID) | ORAL | Status: DC

## 2012-10-20 ENCOUNTER — Other Ambulatory Visit: Payer: Self-pay | Admitting: *Deleted

## 2012-10-20 MED ORDER — METOPROLOL TARTRATE 100 MG PO TABS: 100.0000 mg | ORAL_TABLET | Freq: Two times a day (BID) | ORAL | Status: DC

## 2012-10-28 ENCOUNTER — Encounter (HOSPITAL_COMMUNITY): Payer: Self-pay | Admitting: *Deleted

## 2012-10-28 ENCOUNTER — Emergency Department (HOSPITAL_COMMUNITY): Payer: Self-pay

## 2012-10-28 ENCOUNTER — Emergency Department (HOSPITAL_COMMUNITY)
Admission: EM | Admit: 2012-10-28 | Discharge: 2012-10-28 | Disposition: A | Discharge status: Home / Self Care | Payer: Self-pay | Attending: Emergency Medicine | Admitting: Emergency Medicine

## 2012-10-28 DIAGNOSIS — M79609 Pain in unspecified limb: Secondary | ICD-10-CM | POA: Insufficient documentation

## 2012-10-28 DIAGNOSIS — Z79899 Other long term (current) drug therapy: Secondary | ICD-10-CM | POA: Insufficient documentation

## 2012-10-28 DIAGNOSIS — E669 Obesity, unspecified: Secondary | ICD-10-CM | POA: Insufficient documentation

## 2012-10-28 DIAGNOSIS — Z87442 Personal history of urinary calculi: Secondary | ICD-10-CM | POA: Insufficient documentation

## 2012-10-28 DIAGNOSIS — Z87440 Personal history of urinary (tract) infections: Secondary | ICD-10-CM | POA: Insufficient documentation

## 2012-10-28 DIAGNOSIS — E119 Type 2 diabetes mellitus without complications: Secondary | ICD-10-CM | POA: Insufficient documentation

## 2012-10-28 DIAGNOSIS — R229 Localized swelling, mass and lump, unspecified: Secondary | ICD-10-CM | POA: Insufficient documentation

## 2012-10-28 DIAGNOSIS — M7989 Other specified soft tissue disorders: Secondary | ICD-10-CM

## 2012-10-28 DIAGNOSIS — M109 Gout, unspecified: Secondary | ICD-10-CM

## 2012-10-28 DIAGNOSIS — G8929 Other chronic pain: Secondary | ICD-10-CM | POA: Insufficient documentation

## 2012-10-28 DIAGNOSIS — I10 Essential (primary) hypertension: Secondary | ICD-10-CM | POA: Insufficient documentation

## 2012-10-28 DIAGNOSIS — M79671 Pain in right foot: Secondary | ICD-10-CM

## 2012-10-28 DIAGNOSIS — E785 Hyperlipidemia, unspecified: Secondary | ICD-10-CM | POA: Insufficient documentation

## 2012-10-28 MED ORDER — HYDROCODONE-ACETAMINOPHEN 5-325 MG PO TABS
2.0000 | ORAL_TABLET | Freq: Once | ORAL | Status: AC
  Administered 2012-10-28: 2 via ORAL
  Filled 2012-10-28: qty 2

## 2012-10-28 MED ORDER — IBUPROFEN 400 MG PO TABS
400.0000 mg | ORAL_TABLET | Freq: Once | ORAL | Status: AC
  Administered 2012-10-28: 400 mg via ORAL
  Filled 2012-10-28: qty 1

## 2012-10-28 MED ORDER — HYDROCODONE-ACETAMINOPHEN 5-325 MG PO TABS: ORAL_TABLET | ORAL | Status: DC

## 2012-10-28 MED ORDER — INDOMETHACIN 25 MG PO CAPS: ORAL_CAPSULE | ORAL | Status: DC

## 2012-10-28 NOTE — ED Provider Notes (Signed)
CSN: [id number]     Arrival date & time 10/28/12  1700 History   First MD Initiated Contact with Patient 10/28/12 1936     Chief Complaint  Patient presents with  . Foot Pain    HPI Pt was seen at 2020. Per pt and his wife, c/o gradual onset and persistence of constant right foot "pain" for the past 3 days. Describes the pain as "when my gout acts up." Pain worsens with ambulation and palpation of the area. States the pain began after he "was doing a lot of walking" which has been known previously to "flair up my gout."  Denies injury, no specific area of point tenderness.  Pt also c/o gradual onset and persistence of constant "knot" in his skin in his right calf area for the past several years, worse over the past several weeks. Pt states he "noticed it more" recently after he hit the same area against his truck. Denies open wounds, no erythema, no ecchymosis, no focal motor weakness, no tingling/numbness in extremities, no calf pain or unilateral swelling.     Past Medical History  Diagnosis Date  . Knee pain     feet   . Gout   . Hypertension   . Diabetes mellitus   . Palpitation   . Generalized headaches   . UTI (lower urinary tract infection)   . DM (diabetes mellitus) 09/01/2010       . Obesity (BMI 30-39.9) 09/01/2010  . Gout 11/26/2011  . HYPERTENSION, UNCONTROLLED 09/09/2009    Qualifier: Diagnosis of  By: Via LPN, Larita Fife    . SLEEP APNEA 09/09/2009    Qualifier: Diagnosis of  By: Via LPN, Larita Fife    . Hyperlipidemia   . Chronic neck pain   . Arthritis   . Kidney stones    Past Surgical History  Procedure Laterality Date  . Kidney stone surgery      post ureteral stent and status post lithotripsy (10-15 yrs Ago)  . Colonoscopy with esophagogastroduodenoscopy (egd) N/A 05/01/2012    [id number] esophagus. Small hiatal hernia. 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 pylori infection-status post biopsy (H.Pylori +). Minimally excoriated anal canal tissue.  No hemorrhoids. Pancolonic diverticulosis. 8 mm polyp distal to the ICV, tubular adenoma. Next colonoscopy in April 2019.  Marland Kitchen Esophagogastroduodenoscopy endoscopy     Family History  Problem Relation Age of Onset  . Arthritis Mother   . Arthritis Other   . Heart disease Other   . Hypertension Other   . Sudden death Other   . Arthritis Other   . Heart disease Other   . Hypertension Other   . Alcohol abuse Other   . Cancer Other   . Asthma Other     breast cancer  . Heart disease Other   . Hypertension Other   . Diabetes Other   . Colon cancer Neg Hx   . Lung cancer Maternal Uncle   . Breast cancer Maternal Aunt   . Colon polyps Sister     age 38  . Colon polyps Mother    History  Substance Use Topics  . Smoking status: Never Smoker   . Smokeless tobacco: Never Used  . Alcohol Use: No    Review of Systems ROS: Statement: All systems negative except as marked or noted in the HPI; Constitutional: Negative for fever and chills. ; ; Eyes: Negative for eye pain, redness and discharge. ; ; ENMT: Negative for ear pain, hoarseness, nasal congestion, sinus pressure and  sore throat. ; ; Cardiovascular: Negative for chest pain, palpitations, diaphoresis, dyspnea and peripheral edema. ; ; Respiratory: Negative for cough, wheezing and stridor. ; ; Gastrointestinal: Negative for nausea, vomiting, diarrhea, abdominal pain, blood in stool, hematemesis, jaundice and rectal bleeding. . ; ; Genitourinary: Negative for dysuria, flank pain and hematuria. ; ; Musculoskeletal: +right foot pain. Negative for back pain and neck pain. Negative for swelling and trauma.; ; Skin: Negative for pruritus, rash, abrasions, blisters, bruising. +skin nodule.; ; Neuro: Negative for headache, lightheadedness and neck stiffness. Negative for weakness, altered level of consciousness , altered mental status, extremity weakness, paresthesias, involuntary movement, seizure and syncope.       Allergies  Clonidine  derivatives and Oxycodone  Home Medications   Current Outpatient Rx  Name  Route  Sig  Dispense  Refill  . amLODipine (NORVASC) 10 MG tablet   Oral   Take 10 mg by mouth daily.         . colchicine (COLCRYS) 0.6 MG tablet   Oral   Take 1 tablet (0.6 mg total) by mouth 2 (two) times daily.   180 tablet   3   . diphenhydrAMINE (BENADRYL) 25 MG tablet   Oral   Take 25 mg by mouth every 6 (six) hours as needed for itching or allergies.         Marland Kitchen doxazosin (CARDURA) 8 MG tablet   Oral   Take 8 mg by mouth at bedtime.         . Fiber, Guar Gum, CHEW   Oral   Chew by mouth 2 (two) times daily.         . fish oil-omega-3 fatty acids 1000 MG capsule   Oral   Take 1 g by mouth daily.         Marland Kitchen glipiZIDE (GLUCOTROL XL) 10 MG 24 hr tablet   Oral   Take 1 tablet (10 mg total) by mouth daily.   90 tablet   3   . hydrALAZINE (APRESOLINE) 25 MG tablet   Oral   Take 1 tablet (25 mg total) by mouth daily.   30 tablet   4   . hydrochlorothiazide (HYDRODIURIL) 25 MG tablet   Oral   Take 25 mg by mouth daily.         Marland Kitchen ibuprofen (ADVIL,MOTRIN) 200 MG tablet   Oral   Take 800 mg by mouth every 6 (six) hours as needed for pain (twice a week).          . isosorbide mononitrate (IMDUR) 30 MG 24 hr tablet   Oral   Take 1 tablet (30 mg total) by mouth daily.   30 tablet   4   . metFORMIN (GLUCOPHAGE) 500 MG tablet   Oral   Take 2 tablets (1,000 mg total) by mouth 2 (two) times daily with a meal.   180 tablet   3   . metoprolol (LOPRESSOR) 100 MG tablet   Oral   Take 1 tablet (100 mg total) by mouth 2 (two) times daily.   60 tablet   2   . pravastatin (PRAVACHOL) 20 MG tablet   Oral   Take 20 mg by mouth at bedtime.         . methocarbamol (ROBAXIN) 500 MG tablet   Oral   Take 1,000 mg by mouth 4 (four) times daily as needed (muscle spasm/pain).          BP 155/97  Pulse 99  Temp(Src) 100 F (  37.8 C) (Oral)  Resp 20  Ht 5\' 9"  (1.753 m)  Wt  314 lb (142.429 kg)  BMI 46.35 kg/m2  SpO2 98% Physical Exam 2025: Physical examination:  Nursing notes reviewed; Vital signs and O2 SAT reviewed;  Constitutional: Well developed, Well nourished, Well hydrated, In no acute distress; Head:  Normocephalic, atraumatic; Eyes: EOMI, PERRL, No scleral icterus; ENMT: Mouth and pharynx normal, Mucous membranes moist; Neck: Supple, Full range of motion, No lymphadenopathy; Cardiovascular: Regular rate and rhythm, No murmur, rub, or gallop; Respiratory: Breath sounds clear & equal bilaterally, No rales, rhonchi, wheezes.  Speaking full sentences with ease, Normal respiratory effort/excursion; Chest: Nontender, Movement normal; Abdomen: Soft, Nontender, Nondistended, Normal bowel sounds; Genitourinary: No CVA tenderness; Extremities: Pulses normal,+right dorsal lateral foot with generalized tenderness, edema, warmth and mild erythema. No streaking up leg, no open wounds, no ecchymosis, no deformity. NMS intact right foot. NT right ankle. Strong pedal pulse. +approx 2cm diameter nodule palp in skin/superifical SQ tissue right lateral calf without tenderness, open wound, drainage, or overlying erythema. No calf edema or asymmetry.; Neuro: AA&Ox3, Major CN grossly intact.  Speech clear. No gross focal motor or sensory deficits in extremities.; Skin: Color normal, Warm, Dry.   ED Course  Procedures     MDM  MDM Reviewed: previous chart, nursing note and vitals Interpretation: x-ray   Dg Foot Complete Right 10/28/2012   CLINICAL DATA:  Pain. No injury.  EXAM: RIGHT FOOT COMPLETE - 3+ VIEW  COMPARISON:  05/18/2003  FINDINGS: Examination demonstrates degenerative changes of the midfoot, hindfoot and tibiotalar joint as well as the 1st MTP joint. There is no definite fracture or dislocation.  IMPRESSION: No acute findings.  Degenerative changes as described.   Electronically Signed   By: Elberta Fortis M.D.   On: 10/28/2012 21:04     2130:  Bedside US of right  calf skin/SQ nodule not abscess; likely soft tissue lipoma or hematoma given previous hx of "hitting leg on my truck" before pt noticed it "worsen." Doubt DVT. Encouraged to f/u with PMD for MRI prn.  Will tx symptomatically for gout flair at this time. Pt already has crutches.  Dx and testing d/w pt and family.  Questions answered.  Verb understanding, agreeable to d/c home with outpt f/u.   Laray Anger, DO 10/31/12 1331

## 2012-10-28 NOTE — ED Notes (Addendum)
Nodule on R posterior calf and ankle pain.  Pain radiates up R leg laterally.  R foot swelling and slight warmth.  Has been taking ibuprofen and tylenol alternating which hasn't helped.  Fever x 3 days.

## 2012-10-28 NOTE — ED Notes (Signed)
Pt c/o rt foot pain and denies any injury. The foot is warm to the touch and pulses are present. Pt has hx of gout and believes this to be gout. Pt also has knot to the rt calf and area is cool to touch.

## 2012-11-27 ENCOUNTER — Other Ambulatory Visit: Payer: Self-pay

## 2012-12-15 ENCOUNTER — Other Ambulatory Visit: Payer: Self-pay | Admitting: Internal Medicine

## 2013-01-09 ENCOUNTER — Ambulatory Visit: Payer: Self-pay | Admitting: Internal Medicine

## 2013-01-28 ENCOUNTER — Ambulatory Visit: Payer: Self-pay | Admitting: Internal Medicine

## 2013-01-28 DIAGNOSIS — Z0289 Encounter for other administrative examinations: Secondary | ICD-10-CM

## 2013-03-12 ENCOUNTER — Other Ambulatory Visit: Payer: Self-pay

## 2013-03-12 ENCOUNTER — Other Ambulatory Visit: Payer: Self-pay | Admitting: Adult Health

## 2013-03-12 DIAGNOSIS — R945 Abnormal results of liver function studies: Secondary | ICD-10-CM

## 2013-03-28 LAB — HEPATIC FUNCTION PANEL
ALT: 41 U/L (ref 0–53)
AST: 38 U/L — ABNORMAL HIGH (ref 0–37)
Albumin: 4.2 g/dL (ref 3.5–5.2)
Alkaline Phosphatase: 94 U/L (ref 39–117)
Bilirubin, Direct: 0.1 mg/dL (ref 0.0–0.3)
Indirect Bilirubin: 0.4 mg/dL (ref 0.2–1.2)
Total Bilirubin: 0.5 mg/dL (ref 0.2–1.2)
Total Protein: 8 g/dL (ref 6.0–8.3)

## 2013-04-07 ENCOUNTER — Encounter: Payer: Self-pay | Admitting: Internal Medicine

## 2013-04-14 ENCOUNTER — Other Ambulatory Visit: Payer: Self-pay | Admitting: Adult Health

## 2013-04-14 ENCOUNTER — Other Ambulatory Visit: Payer: Self-pay | Admitting: Internal Medicine

## 2013-04-17 ENCOUNTER — Other Ambulatory Visit: Payer: Self-pay | Admitting: Internal Medicine

## 2013-04-17 ENCOUNTER — Ambulatory Visit (INDEPENDENT_AMBULATORY_CARE_PROVIDER_SITE_OTHER): Payer: Self-pay | Admitting: Internal Medicine

## 2013-04-17 ENCOUNTER — Ambulatory Visit (HOSPITAL_COMMUNITY)
Admission: RE | Admit: 2013-04-17 | Discharge: 2013-04-17 | Disposition: A | Discharge status: Home / Self Care | Payer: Self-pay | Source: Ambulatory Visit | Attending: Internal Medicine | Admitting: Internal Medicine

## 2013-04-17 ENCOUNTER — Encounter: Payer: Self-pay | Admitting: Internal Medicine

## 2013-04-17 VITALS — BP 157/97 | HR 65 | Temp 98.1°F | Ht 69.0 in | Wt 332.0 lb

## 2013-04-17 DIAGNOSIS — R109 Unspecified abdominal pain: Secondary | ICD-10-CM

## 2013-04-17 DIAGNOSIS — D126 Benign neoplasm of colon, unspecified: Secondary | ICD-10-CM

## 2013-04-17 DIAGNOSIS — R509 Fever, unspecified: Secondary | ICD-10-CM

## 2013-04-17 DIAGNOSIS — R945 Abnormal results of liver function studies: Secondary | ICD-10-CM

## 2013-04-17 DIAGNOSIS — R7989 Other specified abnormal findings of blood chemistry: Secondary | ICD-10-CM

## 2013-04-17 DIAGNOSIS — Z139 Encounter for screening, unspecified: Secondary | ICD-10-CM

## 2013-04-17 DIAGNOSIS — N209 Urinary calculus, unspecified: Secondary | ICD-10-CM | POA: Insufficient documentation

## 2013-04-17 MED ORDER — IOHEXOL 300 MG/ML  SOLN
100.0000 mL | Freq: Once | INTRAMUSCULAR | Status: AC | PRN
  Administered 2013-04-17: 100 mL via INTRAVENOUS

## 2013-04-17 NOTE — Patient Instructions (Signed)
Proceed with a contrast CT of abdomen and pelvis to further evaluate left sided pain and history of fever  Continue with your excellent blood sugar control  Further recommendations to follow

## 2013-04-17 NOTE — Progress Notes (Signed)
Primary Care Physician:  Cathlean Cower, MD Primary Gastroenterologist:  Dr. Gala Romney  Pre-Procedure History & Physical: HPI:  Nicholas Turner is a 43 y.o. male here for H. pylori gastritis and colonic adenoma. Was treated for H. pylori last year. Colonic adenoma removed at time of colonoscopy. He brought his hemoglobin A1c down in the 5 range pretty lost 20 pounds but gained a good bit of that back over the winter months. Had an attack of gout. Bowel function is normalized at 3 bowel movements daily. Has had some left-sided tingling pain almost in the left flank area; wife says he's had  a temperature intermittently in the 100 range. Not associated with eating or bowel function. He's had discomfort for 4 months. Nonprogressive. No urinary tract symptoms. No history of CT scanning at least in the last 10 years. He does have some tingling in his back and in his feet from time to time. He is not on Neurontin or Lyrica.  Bump in LFTs previously;  assay completely normal LFTs August 2014. I note iron studies normal previously. Repeat LFTs 2 weeks ago demonstrated a mildly elevated AST (one point above normal); everything else okay.  Past Medical History  Diagnosis Date  . Knee pain     feet   . Gout   . Hypertension   . Diabetes mellitus   . Palpitation   . Generalized headaches   . UTI (lower urinary tract infection)   . DM (diabetes mellitus) 09/01/2010       . Obesity (BMI 30-39.9) 09/01/2010  . Gout 11/26/2011  . HYPERTENSION, UNCONTROLLED 09/09/2009    Qualifier: Diagnosis of  By: Via LPN, Jeani Hawking    . SLEEP APNEA 09/09/2009    Qualifier: Diagnosis of  By: Via LPN, Jeani Hawking    . Hyperlipidemia   . Chronic neck pain   . Arthritis   . Kidney stones   . Fatty liver     Past Surgical History  Procedure Laterality Date  . Kidney stone surgery      post ureteral stent and status post lithotripsy (10-15 yrs Ago)  . Colonoscopy with esophagogastroduodenoscopy (egd) N/A 05/01/2012    ZDG:LOVFIE esophagus.  Small hiatal hernia. 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 pylori infection-status post biopsy (H.Pylori +). Minimally excoriated anal canal tissue. No hemorrhoids. Pancolonic diverticulosis. 8 mm polyp distal to the ICV, tubular adenoma. Next colonoscopy in April 2019.  Marland Kitchen Esophagogastroduodenoscopy endoscopy      Prior to Admission medications   Medication Sig Start Date End Date Taking? Authorizing Provider  amLODipine (NORVASC) 10 MG tablet Take 10 mg by mouth daily. 07/11/12  Yes Biagio Borg, MD  colchicine (COLCRYS) 0.6 MG tablet Take 1 tablet (0.6 mg total) by mouth 2 (two) times daily. 11/26/11  Yes Biagio Borg, MD  diphenhydrAMINE (BENADRYL) 25 MG tablet Take 25 mg by mouth every 6 (six) hours as needed for itching or allergies.   Yes Historical Provider, MD  doxazosin (CARDURA) 8 MG tablet TAKE ONE TABLET BY MOUTH ONCE DAILY **PLEASE CALL OFFICE FOR FOLLOW UP VISIT FOR ANY FURTHER REFILLS** 04/14/13  Yes Lendon Colonel, NP  Fiber, Guar Gum, CHEW Chew by mouth 2 (two) times daily.   Yes Historical Provider, MD  fish oil-omega-3 fatty acids 1000 MG capsule Take 1 g by mouth daily.   Yes Historical Provider, MD  glipiZIDE (GLUCOTROL XL) 10 MG 24 hr tablet Take 1 tablet (10 mg total) by mouth daily. 04/10/12  Yes Jeneen Rinks  Quin Hoop, MD  hydrALAZINE (APRESOLINE) 25 MG tablet TAKE ONE TABLET BY MOUTH ONCE DAILY 04/14/13  Yes Lendon Colonel, NP  hydrochlorothiazide (HYDRODIURIL) 25 MG tablet TAKE ONE TABLET BY MOUTH ONCE DAILY 04/14/13  Yes Lendon Colonel, NP  HYDROcodone-acetaminophen (NORCO/VICODIN) 5-325 MG per tablet 1 or 2 tabs PO q6 hours prn pain 10/28/12  Yes Alfonzo Feller, DO  ibuprofen (ADVIL,MOTRIN) 200 MG tablet Take 800 mg by mouth every 6 (six) hours as needed for pain (twice a week).    Yes Historical Provider, MD  isosorbide mononitrate (IMDUR) 30 MG 24 hr tablet TAKE ONE TABLET BY MOUTH ONCE DAILY 04/14/13  Yes Lendon Colonel, NP  metFORMIN  (GLUCOPHAGE) 500 MG tablet TAKE TWO TABLETS BY MOUTH TWICE DAILY WITH A MEAL 12/15/12  Yes Biagio Borg, MD  methocarbamol (ROBAXIN) 500 MG tablet Take 1,000 mg by mouth 4 (four) times daily as needed (muscle spasm/pain). 03/10/12  Yes Alfonzo Feller, DO  metoprolol (LOPRESSOR) 100 MG tablet Take 1 tablet (100 mg total) by mouth 2 (two) times daily. 10/20/12  Yes Satira Sark, MD  pravastatin (PRAVACHOL) 20 MG tablet TAKE ONE TABLET BY MOUTH ONCE DAILY 04/14/13  Yes Lendon Colonel, NP  indomethacin (INDOCIN) 25 MG capsule 50mg  PO TID x2 days, then 25mg  PO TID x3 days 10/28/12   Alfonzo Feller, DO    Allergies as of 04/17/2013 - Review Complete 04/17/2013  Allergen Reaction Noted  . Clonidine derivatives Other (See Comments) 05/10/2010  . Oxycodone Nausea And Vomiting 09/01/2010    Family History  Problem Relation Age of Onset  . Arthritis Mother   . Arthritis Other   . Heart disease Other   . Hypertension Other   . Sudden death Other   . Arthritis Other   . Heart disease Other   . Hypertension Other   . Alcohol abuse Other   . Cancer Other   . Asthma Other     breast cancer  . Heart disease Other   . Hypertension Other   . Diabetes Other   . Colon cancer Neg Hx   . Lung cancer Maternal Uncle   . Breast cancer Maternal Aunt   . Colon polyps Sister     age 46  . Colon polyps Mother     History   Social History  . Marital Status: Married    Spouse Name: N/A    Number of Children: 3  . Years of Education: N/A   Occupational History  . full time      pest control   Social History Main Topics  . Smoking status: Never Smoker   . Smokeless tobacco: Never Used     Comment: Never smoker  . Alcohol Use: No  . Drug Use: No  . Sexual Activity: Not on file   Other Topics Concern  . Not on file   Social History Narrative  . No narrative on file    Review of Systems: See HPI, otherwise negative ROS  Physical Exam: BP 157/97  Pulse 65  Temp(Src) 98.1  F (36.7 C) (Oral)  Ht 5\' 9"  (1.753 m)  Wt 332 lb (150.594 kg)  BMI 49.01 kg/m2 General:   Alert,  Well-developed, well-nourished, pleasant and cooperative in NAD. Morbidly obese. Accompanied by spouse. Skin:  Intact without significant lesions or rashes. Eyes:  Sclera clear, no icterus.   Conjunctiva pink. Ears:  Normal auditory acuity. Nose:  No deformity, discharge,  or lesions. Mouth:  No deformity or lesions. Neck:  Supple; no masses or thyromegaly. No significant cervical adenopathy. Lungs:  Clear throughout to auscultation.   No wheezes, crackles, or rhonchi. No acute distress. Heart:  Regular rate and rhythm; no murmurs, clicks, rubs,  or gallops. Abdomen: Obese. Positive bowel sounds. Do not see any skin lesions. Abdomen is soft there is a little tenderness left mid abdomen lateral to the umbilicus. Do not appreciate a mass, organomegaly megaly or obvious hernia. There is no CVA tenderness. Pulses:  Normal pulses noted. Extremities:  Without clubbing or edema.  Impression:    Morbidly obese 43 year old gentleman with a history of colonic adenoma;  HP recently treated. He is doing well aside from a four-month history of left-sided abdominal pain. I get a sense it is more cutaneous and possibly neuropathic in origin. History of low-grade temperature of uncertain significance at this time. He does have some localized left lateral abdominal tenderness on exam today. No recent cross-sectional imaging. LFTs look good except for a very minimally elevated AST. He was commended on better glycemic control. He needs to lose more weight, however.  Recommendations:      Proceed with a contrast CT of abdomen and pelvis to further evaluate left sided pain and history of fever  Continue with your excellent blood sugar control  Patient may ultimately have neuropathy you may benefit from a course of Neurontin.  Recommended continued regular exercise. Consider water aerobics at the [facility]  Further  recommendations to follow

## 2013-04-23 ENCOUNTER — Other Ambulatory Visit: Payer: Self-pay | Admitting: Internal Medicine

## 2013-04-23 MED ORDER — GABAPENTIN 300 MG PO CAPS: 300.0000 mg | ORAL_CAPSULE | Freq: Three times a day (TID) | ORAL | Status: DC

## 2013-04-27 ENCOUNTER — Encounter: Payer: Self-pay | Admitting: Internal Medicine

## 2013-05-06 ENCOUNTER — Encounter: Payer: Self-pay | Admitting: *Deleted

## 2013-06-08 ENCOUNTER — Ambulatory Visit: Payer: Self-pay | Admitting: Gastroenterology

## 2013-06-11 ENCOUNTER — Ambulatory Visit: Payer: Self-pay | Admitting: Gastroenterology

## 2014-07-19 ENCOUNTER — Other Ambulatory Visit: Payer: Self-pay

## 2014-09-10 ENCOUNTER — Encounter (HOSPITAL_COMMUNITY): Payer: Self-pay | Admitting: Emergency Medicine

## 2014-09-10 ENCOUNTER — Emergency Department (HOSPITAL_COMMUNITY)
Admission: EM | Admit: 2014-09-10 | Discharge: 2014-09-10 | Disposition: A | Discharge status: Home / Self Care | Payer: 59 | Attending: Emergency Medicine | Admitting: Emergency Medicine

## 2014-09-10 DIAGNOSIS — K5732 Diverticulitis of large intestine without perforation or abscess without bleeding: Secondary | ICD-10-CM | POA: Diagnosis not present

## 2014-09-10 DIAGNOSIS — E785 Hyperlipidemia, unspecified: Secondary | ICD-10-CM | POA: Insufficient documentation

## 2014-09-10 DIAGNOSIS — Z87442 Personal history of urinary calculi: Secondary | ICD-10-CM | POA: Diagnosis not present

## 2014-09-10 DIAGNOSIS — E669 Obesity, unspecified: Secondary | ICD-10-CM | POA: Diagnosis not present

## 2014-09-10 DIAGNOSIS — Z8639 Personal history of other endocrine, nutritional and metabolic disease: Secondary | ICD-10-CM | POA: Diagnosis not present

## 2014-09-10 DIAGNOSIS — I10 Essential (primary) hypertension: Secondary | ICD-10-CM | POA: Insufficient documentation

## 2014-09-10 DIAGNOSIS — M1990 Unspecified osteoarthritis, unspecified site: Secondary | ICD-10-CM | POA: Diagnosis not present

## 2014-09-10 DIAGNOSIS — Z87440 Personal history of urinary (tract) infections: Secondary | ICD-10-CM | POA: Diagnosis not present

## 2014-09-10 DIAGNOSIS — E119 Type 2 diabetes mellitus without complications: Secondary | ICD-10-CM | POA: Diagnosis not present

## 2014-09-10 DIAGNOSIS — K5792 Diverticulitis of intestine, part unspecified, without perforation or abscess without bleeding: Secondary | ICD-10-CM

## 2014-09-10 DIAGNOSIS — Z79899 Other long term (current) drug therapy: Secondary | ICD-10-CM | POA: Insufficient documentation

## 2014-09-10 DIAGNOSIS — G8929 Other chronic pain: Secondary | ICD-10-CM | POA: Insufficient documentation

## 2014-09-10 DIAGNOSIS — R1032 Left lower quadrant pain: Secondary | ICD-10-CM | POA: Diagnosis present

## 2014-09-10 HISTORY — DX: Gastroparesis: K31.84

## 2014-09-10 HISTORY — DX: Diverticulosis of intestine, part unspecified, without perforation or abscess without bleeding: K57.90

## 2014-09-10 LAB — LIPASE, BLOOD: Lipase: 19 U/L — ABNORMAL LOW (ref 22–51)

## 2014-09-10 LAB — COMPREHENSIVE METABOLIC PANEL
ALT: 28 U/L (ref 17–63)
AST: 25 U/L (ref 15–41)
Albumin: 4.1 g/dL (ref 3.5–5.0)
Alkaline Phosphatase: 82 U/L (ref 38–126)
Anion gap: 10 (ref 5–15)
BUN: 18 mg/dL (ref 6–20)
CO2: 26 mmol/L (ref 22–32)
Calcium: 9.1 mg/dL (ref 8.9–10.3)
Chloride: 101 mmol/L (ref 101–111)
Creatinine, Ser: 1.04 mg/dL (ref 0.61–1.24)
GFR calc Af Amer: >60 mL/min (ref >60)
GFR calc non Af Amer: >60 mL/min (ref >60)
Glucose, Bld: 151 mg/dL — ABNORMAL HIGH (ref 65–99)
Potassium: 3.9 mmol/L (ref 3.5–5.1)
Sodium: 137 mmol/L (ref 135–145)
Total Bilirubin: 0.5 mg/dL (ref 0.3–1.2)
Total Protein: 8.3 g/dL — ABNORMAL HIGH (ref 6.5–8.1)

## 2014-09-10 LAB — URINALYSIS, ROUTINE W REFLEX MICROSCOPIC
Bilirubin Urine: NEGATIVE
Glucose, UA: >1000 mg/dL — AB
Hgb urine dipstick: NEGATIVE
Ketones, ur: NEGATIVE mg/dL
Leukocytes, UA: NEGATIVE
Nitrite: NEGATIVE
Protein, ur: NEGATIVE mg/dL
Specific Gravity, Urine: 1.025 (ref 1.005–1.030)
Urobilinogen, UA: 0.2 mg/dL (ref 0.0–1.0)
pH: 5.5 (ref 5.0–8.0)

## 2014-09-10 LAB — CBC WITH DIFFERENTIAL/PLATELET
Basophils Absolute: 0 10*3/uL (ref 0.0–0.1)
Basophils Relative: 0 % (ref 0–1)
Eosinophils Absolute: 0.1 10*3/uL (ref 0.0–0.7)
Eosinophils Relative: 1 % (ref 0–5)
HCT: 41.2 % (ref 39.0–52.0)
Hemoglobin: 13 g/dL (ref 13.0–17.0)
Lymphocytes Relative: 9 % — ABNORMAL LOW (ref 12–46)
Lymphs Abs: 1.1 10*3/uL (ref 0.7–4.0)
MCH: 29.5 pg (ref 26.0–34.0)
MCHC: 31.6 g/dL (ref 30.0–36.0)
MCV: 93.4 fL (ref 78.0–100.0)
Monocytes Absolute: 0.9 10*3/uL (ref 0.1–1.0)
Monocytes Relative: 7 % (ref 3–12)
Neutro Abs: 10.9 10*3/uL — ABNORMAL HIGH (ref 1.7–7.7)
Neutrophils Relative %: 83 % — ABNORMAL HIGH (ref 43–77)
Platelets: 218 10*3/uL (ref 150–400)
RBC: 4.41 MIL/uL (ref 4.22–5.81)
RDW: 13.3 % (ref 11.5–15.5)
WBC: 13.1 10*3/uL — ABNORMAL HIGH (ref 4.0–10.5)

## 2014-09-10 LAB — URINE MICROSCOPIC-ADD ON

## 2014-09-10 MED ORDER — ONDANSETRON HCL 4 MG/2ML IJ SOLN
INTRAMUSCULAR | Status: AC
  Administered 2014-09-10: 4 mg via INTRAVENOUS
  Filled 2014-09-10: qty 2

## 2014-09-10 MED ORDER — HYDROCODONE-ACETAMINOPHEN 5-325 MG PO TABS
2.0000 | ORAL_TABLET | ORAL | Status: DC | PRN
Start: 2014-09-10 — End: 2017-04-04

## 2014-09-10 MED ORDER — METRONIDAZOLE IN NACL 5-0.79 MG/ML-% IV SOLN
500.0000 mg | Freq: Once | INTRAVENOUS | Status: AC
  Administered 2014-09-10: 500 mg via INTRAVENOUS
  Filled 2014-09-10: qty 100

## 2014-09-10 MED ORDER — METRONIDAZOLE 500 MG PO TABS: 500.0000 mg | ORAL_TABLET | Freq: Two times a day (BID) | ORAL | Status: DC

## 2014-09-10 MED ORDER — HYDROMORPHONE HCL 1 MG/ML IJ SOLN
1.0000 mg | Freq: Once | INTRAMUSCULAR | Status: AC
  Administered 2014-09-10: 1 mg via INTRAVENOUS
  Filled 2014-09-10: qty 1

## 2014-09-10 MED ORDER — ONDANSETRON HCL 4 MG/2ML IJ SOLN
4.0000 mg | Freq: Once | INTRAMUSCULAR | Status: AC
  Administered 2014-09-10: 4 mg via INTRAVENOUS

## 2014-09-10 MED ORDER — CIPROFLOXACIN IN D5W 400 MG/200ML IV SOLN
400.0000 mg | Freq: Once | INTRAVENOUS | Status: AC
  Administered 2014-09-10: 400 mg via INTRAVENOUS
  Filled 2014-09-10: qty 200

## 2014-09-10 MED ORDER — GI COCKTAIL ~~LOC~~
30.0000 mL | Freq: Once | ORAL | Status: AC
  Administered 2014-09-10: 30 mL via ORAL
  Filled 2014-09-10: qty 30

## 2014-09-10 MED ORDER — SODIUM CHLORIDE 0.9 % IV SOLN
1000.0000 mL | Freq: Once | INTRAVENOUS | Status: AC
  Administered 2014-09-10: 1000 mL via INTRAVENOUS

## 2014-09-10 MED ORDER — CIPROFLOXACIN HCL 500 MG PO TABS: 500.0000 mg | ORAL_TABLET | Freq: Two times a day (BID) | ORAL | Status: DC

## 2014-09-10 NOTE — ED Notes (Signed)
Patient complaining of left sided abdominal pain and vomiting starting this morning.

## 2014-09-10 NOTE — ED Provider Notes (Signed)
CSN: [id number]     Arrival date & time 09/10/14  1422 History   First MD Initiated Contact with Patient 09/10/14 1452     Chief Complaint  Patient presents with  . Abdominal Pain  . Emesis     (Consider location/radiation/quality/duration/timing/severity/associated sxs/prior Treatment) Patient is a 44 y.o. male presenting with abdominal pain.  Abdominal Pain Pain location:  LLQ Pain quality: aching, pressure and sharp   Pain radiates to:  Does not radiate Pain severity:  Mild Onset quality:  Gradual Duration:  2 hours Timing:  Constant Progression:  Waxing and waning Chronicity:  New Context: not awakening from sleep and not medication withdrawal   Relieved by:  None tried Worsened by:  Nothing tried Ineffective treatments:  None tried Associated symptoms: fatigue, nausea and vomiting   Associated symptoms: no chest pain, no chills, no cough, no diarrhea, no flatus, no hematemesis, no hematuria and no sore throat     Past Medical History  Diagnosis Date  . Knee pain     feet   . Gout   . Hypertension   . Diabetes mellitus   . Palpitation   . Generalized headaches   . UTI (lower urinary tract infection)   . DM (diabetes mellitus) 09/01/2010       . Obesity (BMI 30-39.9) 09/01/2010  . Gout 11/26/2011  . HYPERTENSION, UNCONTROLLED 09/09/2009    Qualifier: Diagnosis of  By: Via LPN, Jeani Hawking    . SLEEP APNEA 09/09/2009    Qualifier: Diagnosis of  By: Via LPN, Jeani Hawking    . Hyperlipidemia   . Chronic neck pain   . Arthritis   . Kidney stones   . Fatty liver   . Gastroparesis   . Diverticulosis    Past Surgical History  Procedure Laterality Date  . Kidney stone surgery      post ureteral stent and status post lithotripsy (10-15 yrs Ago)  . Colonoscopy with esophagogastroduodenoscopy (egd) N/A 05/01/2012    FTD:DUKGUR esophagus. Small hiatal hernia. 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 pylori infection-status post biopsy (H.Pylori +). Minimally  excoriated anal canal tissue. No hemorrhoids. Pancolonic diverticulosis. 8 mm polyp distal to the ICV, tubular adenoma. Next colonoscopy in April 2019.  Marland Kitchen Esophagogastroduodenoscopy endoscopy     Family History  Problem Relation Age of Onset  . Arthritis Mother   . Arthritis Other   . Heart disease Other   . Hypertension Other   . Sudden death Other   . Arthritis Other   . Heart disease Other   . Hypertension Other   . Alcohol abuse Other   . Cancer Other   . Asthma Other     breast cancer  . Heart disease Other   . Hypertension Other   . Diabetes Other   . Colon cancer Neg Hx   . Lung cancer Maternal Uncle   . Breast cancer Maternal Aunt   . Colon polyps Sister     age 60  . Colon polyps Mother    Social History  Substance Use Topics  . Smoking status: Never Smoker   . Smokeless tobacco: Never Used     Comment: Never smoker  . Alcohol Use: No    Review of Systems  Constitutional: Positive for fatigue. Negative for chills.  HENT: Negative for sore throat.   Respiratory: Negative for cough.   Cardiovascular: Negative for chest pain.  Gastrointestinal: Positive for nausea, vomiting and abdominal pain. Negative for diarrhea, flatus and hematemesis.  Genitourinary: Negative  for hematuria.  All other systems reviewed and are negative.     Allergies  Clonidine derivatives and Oxycodone  Home Medications   Prior to Admission medications   Medication Sig Start Date End Date Taking? Authorizing Provider  Azilsartan-Chlorthalidone (EDARBYCLOR) 40-25 MG TABS Take 1 tablet by mouth daily.   Yes Historical Provider, MD  colchicine (COLCRYS) 0.6 MG tablet Take 1 tablet (0.6 mg total) by mouth 2 (two) times daily. 11/26/11  Yes Biagio Borg, MD  ibuprofen (ADVIL,MOTRIN) 200 MG tablet Take 800 mg by mouth every 6 (six) hours as needed for pain (twice a week).    Yes Historical Provider, MD  INVOKAMET 50-1000 MG TABS Take 1 tablet by mouth daily.  08/27/14  Yes Historical  Provider, MD  metoprolol (LOPRESSOR) 100 MG tablet Take 1 tablet (100 mg total) by mouth 2 (two) times daily. 10/20/12  Yes Satira Sark, MD  pravastatin (PRAVACHOL) 20 MG tablet TAKE ONE TABLET BY MOUTH ONCE DAILY 04/14/13  Yes Lendon Colonel, NP  ciprofloxacin (CIPRO) 500 MG tablet Take 1 tablet (500 mg total) by mouth 2 (two) times daily. One po bid x 7 days 09/10/14   Merrily Pew, MD  HYDROcodone-acetaminophen (NORCO/VICODIN) 5-325 MG per tablet Take 2 tablets by mouth every 4 (four) hours as needed. 09/10/14   Merrily Pew, MD  metroNIDAZOLE (FLAGYL) 500 MG tablet Take 1 tablet (500 mg total) by mouth 2 (two) times daily. One po bid x 7 days 09/10/14   Merrily Pew, MD   BP 139/81 mmHg  Pulse 89  Temp(Src) 98.8 F (37.1 C) (Oral)  Resp 18  Ht 5\' 9"  (1.753 m)  Wt 317 lb (143.79 kg)  BMI 46.79 kg/m2  SpO2 99% Physical Exam  Constitutional: He is oriented to person, place, and time. He appears well-developed and well-nourished.  HENT:  Head: Normocephalic and atraumatic.  Eyes: Conjunctivae and EOM are normal.  Neck: Normal range of motion. Neck supple.  Cardiovascular: Normal rate and regular rhythm.   Pulmonary/Chest: Effort normal. No respiratory distress.  Abdominal: Soft. There is tenderness (llq). There is no rebound and no guarding.  Musculoskeletal: Normal range of motion. He exhibits no edema or tenderness.  Neurological: He is alert and oriented to person, place, and time.  Skin: Skin is warm and dry. No rash noted.  Nursing note and vitals reviewed.   ED Course  Procedures (including critical care time) Labs Review Labs Reviewed  URINALYSIS, ROUTINE W REFLEX MICROSCOPIC (NOT AT [facility]) - Abnormal; Notable for the following:    Glucose, UA >1000 (*)    All other components within normal limits  CBC WITH DIFFERENTIAL/PLATELET - Abnormal; Notable for the following:    WBC 13.1 (*)    Neutrophils Relative % 83 (*)    Neutro Abs 10.9 (*)    Lymphocytes Relative 9  (*)    All other components within normal limits  COMPREHENSIVE METABOLIC PANEL - Abnormal; Notable for the following:    Glucose, Bld 151 (*)    Total Protein 8.3 (*)    All other components within normal limits  LIPASE, BLOOD - Abnormal; Notable for the following:    Lipase 19 (*)    All other components within normal limits  URINE MICROSCOPIC-ADD ON    Imaging Review No results found. I have personally reviewed and evaluated these images and lab results as part of my medical decision-making.   EKG Interpretation None      MDM   Final diagnoses:  Diverticulitis of intestine without perforation or abscess without bleeding   Patient with likely diverticulitis. Has known diverticulosis left lower quadrant pain, vomiting, nausea. Just started today is not febrile and tachycardic or hypotensive. Time and also the need to CT scan as I feel like it is simple diverticulitis and doubt competitions. However advised pain or symptoms changed or worsened in the left next 24-48 hours to need to be reevaluated and may need a CT scan at that time. I will attempt again the patient to the point where he can tolerate by mouth antibiotics and pain medication for discharge. Has history of kidney stones but does not feel this is similar to that. No testicular symptoms so I doubt torsion.   Patient started on antibiotics, able to tolerate PO, pain controlled. Will advise PCP follow up or return here for new/worsening symptoms.   I have personally and contemperaneously reviewed labs and imaging and used in my decision making as above.   A medical screening exam was performed and I feel the patient has had an appropriate workup for their chief complaint at this time and likelihood of emergent condition existing is low. They have been counseled on decision, discharge, follow up and which symptoms necessitate immediate return to the emergency department. They or their family verbally stated understanding and  agreement with plan and discharged in stable condition.      Merrily Pew, MD 09/10/14 2226

## 2015-05-05 DIAGNOSIS — M1A9XX0 Chronic gout, unspecified, without tophus (tophi): Secondary | ICD-10-CM | POA: Diagnosis not present

## 2015-05-05 DIAGNOSIS — E782 Mixed hyperlipidemia: Secondary | ICD-10-CM | POA: Diagnosis not present

## 2015-05-05 DIAGNOSIS — E1165 Type 2 diabetes mellitus with hyperglycemia: Secondary | ICD-10-CM | POA: Diagnosis not present

## 2015-05-05 DIAGNOSIS — E119 Type 2 diabetes mellitus without complications: Secondary | ICD-10-CM | POA: Diagnosis not present

## 2015-05-10 DIAGNOSIS — E119 Type 2 diabetes mellitus without complications: Secondary | ICD-10-CM | POA: Diagnosis not present

## 2015-05-10 DIAGNOSIS — I10 Essential (primary) hypertension: Secondary | ICD-10-CM | POA: Diagnosis not present

## 2015-05-10 DIAGNOSIS — E782 Mixed hyperlipidemia: Secondary | ICD-10-CM | POA: Diagnosis not present

## 2015-10-28 DIAGNOSIS — E119 Type 2 diabetes mellitus without complications: Secondary | ICD-10-CM | POA: Diagnosis not present

## 2015-11-02 DIAGNOSIS — E785 Hyperlipidemia, unspecified: Secondary | ICD-10-CM | POA: Diagnosis not present

## 2015-11-02 DIAGNOSIS — Z0000 Encounter for general adult medical examination without abnormal findings: Secondary | ICD-10-CM | POA: Diagnosis not present

## 2015-11-02 DIAGNOSIS — E119 Type 2 diabetes mellitus without complications: Secondary | ICD-10-CM | POA: Diagnosis not present

## 2015-11-02 DIAGNOSIS — Z23 Encounter for immunization: Secondary | ICD-10-CM | POA: Diagnosis not present

## 2015-11-02 DIAGNOSIS — I10 Essential (primary) hypertension: Secondary | ICD-10-CM | POA: Diagnosis not present

## 2016-03-06 DIAGNOSIS — E119 Type 2 diabetes mellitus without complications: Secondary | ICD-10-CM | POA: Diagnosis not present

## 2016-03-06 DIAGNOSIS — I10 Essential (primary) hypertension: Secondary | ICD-10-CM | POA: Diagnosis not present

## 2016-03-06 DIAGNOSIS — E782 Mixed hyperlipidemia: Secondary | ICD-10-CM | POA: Diagnosis not present

## 2016-03-09 DIAGNOSIS — I10 Essential (primary) hypertension: Secondary | ICD-10-CM | POA: Diagnosis not present

## 2016-03-09 DIAGNOSIS — M1A9XX0 Chronic gout, unspecified, without tophus (tophi): Secondary | ICD-10-CM | POA: Diagnosis not present

## 2016-03-09 DIAGNOSIS — E785 Hyperlipidemia, unspecified: Secondary | ICD-10-CM | POA: Diagnosis not present

## 2016-03-09 DIAGNOSIS — E119 Type 2 diabetes mellitus without complications: Secondary | ICD-10-CM | POA: Diagnosis not present

## 2016-05-30 ENCOUNTER — Encounter (INDEPENDENT_AMBULATORY_CARE_PROVIDER_SITE_OTHER): Payer: Self-pay

## 2016-11-05 DIAGNOSIS — E119 Type 2 diabetes mellitus without complications: Secondary | ICD-10-CM | POA: Diagnosis not present

## 2016-11-07 DIAGNOSIS — E119 Type 2 diabetes mellitus without complications: Secondary | ICD-10-CM | POA: Diagnosis not present

## 2016-12-18 DIAGNOSIS — Z713 Dietary counseling and surveillance: Secondary | ICD-10-CM | POA: Diagnosis not present

## 2017-03-26 ENCOUNTER — Encounter: Payer: Self-pay | Admitting: Internal Medicine

## 2017-03-27 DIAGNOSIS — F39 Unspecified mood [affective] disorder: Secondary | ICD-10-CM | POA: Diagnosis not present

## 2017-03-27 DIAGNOSIS — G4733 Obstructive sleep apnea (adult) (pediatric): Secondary | ICD-10-CM | POA: Diagnosis not present

## 2017-03-27 DIAGNOSIS — R600 Localized edema: Secondary | ICD-10-CM | POA: Diagnosis not present

## 2017-04-04 ENCOUNTER — Ambulatory Visit (INDEPENDENT_AMBULATORY_CARE_PROVIDER_SITE_OTHER): Payer: Self-pay

## 2017-04-04 DIAGNOSIS — Z86010 Personal history of colonic polyps: Secondary | ICD-10-CM

## 2017-04-04 MED ORDER — PEG 3350-KCL-NA BICARB-NACL 420 G PO SOLR: 4000.0000 mL | ORAL | 0 refills | Status: DC

## 2017-04-04 NOTE — Progress Notes (Signed)
Gastroenterology Pre-Procedure Review  Request Date:04/04/17 Requesting Physician: Dr.Rourk 5 year recall last tcs 05/04/12 RMR tubular adenoma   PATIENT REVIEW QUESTIONS: The patient responded to the following health history questions as indicated:    1. Diabetes Melitis: no 2. Joint replacements in the past 12 months: no 3. Major health problems in the past 3 months: no 4. Has an artificial valve or MVP: no 5. Has a defibrillator: no 6. Has been advised in past to take antibiotics in advance of a procedure like teeth cleaning: no 7. Family history of colon cancer: no  8. Alcohol Use: no 9. History of sleep apnea: yes (on cpap)  10. History of coronary artery or other vascular stents placed within the last 12 months: no 11. History of any prior anesthesia complications: no    MEDICATIONS & ALLERGIES:    Patient reports the following regarding taking any blood thinners:   Plavix? no Aspirin? no Coumadin? no Brilinta? no Xarelto? no Eliquis? no Pradaxa? no Savaysa? no Effient? no  Patient confirms/reports the following medications:  Current Outpatient Medications  Medication Sig Dispense Refill  . buPROPion (WELLBUTRIN SR) 150 MG 12 hr tablet daily.  0  . metoprolol (LOPRESSOR) 100 MG tablet Take 1 tablet (100 mg total) by mouth 2 (two) times daily. 60 tablet 2  . pravastatin (PRAVACHOL) 20 MG tablet TAKE ONE TABLET BY MOUTH ONCE DAILY 30 tablet 0  . telmisartan-hydrochlorothiazide (MICARDIS HCT) 80-25 MG tablet daily.  5   No current facility-administered medications for this visit.     Patient confirms/reports the following allergies:  Allergies  Allergen Reactions  . Clonidine Derivatives Other (See Comments)    Welps  . Oxycodone Nausea And Vomiting    No orders of the defined types were placed in this encounter.   AUTHORIZATION INFORMATION Primary Insurance: [place] ,  [place] #: [id number] Pre-Cert / Josem Kaufmann required: no   SCHEDULE INFORMATION: Procedure has  been scheduled as follows:  Date: 05/22/17, Time: 12:00 Location: [facility] Dr.Rourk   This Gastroenterology Pre-Precedure Review Form is being routed to the following provider(s): Roseanne Kaufman NP

## 2017-04-04 NOTE — Progress Notes (Signed)
Appropriate.

## 2017-04-04 NOTE — Patient Instructions (Signed)
Nicholas Turner   November 23, 1970 MRN: [id number]    Procedure Date: 05/22/17 Time to register: 11:00 Place to register: [place] Stay Procedure Time: 12:00 Scheduled provider: R. Garfield Cornea, MD  PREPARATION FOR COLONOSCOPY WITH TRI-LYTE SPLIT PREP  Please notify us immediately if you are diabetic, take iron supplements, or if you are on Coumadin or any other blood thinners.     You will need to purchase 1 fleet enema and 1 box of Bisacodyl 62m tablets.   2 DAYS BEFORE PROCEDURE:  DATE: 05/20/17   DAY: Monday Begin clear liquid diet AFTER your lunch meal. NO SOLID FOODS after this point.  1 DAY BEFORE PROCEDURE:  DATE: 05/21/17   DAY: Tuesday Continue clear liquids the entire day - NO SOLID FOOD.    At 2:00 pm:  Take 2 Bisacodyl tablets.   At 4:00pm:  Start drinking your solution. Make sure you mix well per instructions on the bottle. Try to drink 1 (one) 8 ounce glass every 10-15 minutes until you have consumed HALF the jug. You should complete by 6:00pm.You must keep the left over solution refrigerated until completed next day.  Continue clear liquids. You must drink plenty of clear liquids to prevent dehyration and kidney failure. Nothing to eat or drink after midnight.  EXCEPTION: If you take medications for your heart, blood pressure or breathing, you may take these medications with a small amount of clear liquid.    DAY OF PROCEDURE:   DATE: 05/22/17   DAY: Wednesday    Five hours before your procedure time @ 7:00am:  Finish remaining amout of bowel prep, drinking 1 (one) 8 ounce glass every 10-15 minutes until complete. You have two hours to consume remaining prep.   Three hours before your procedure time _0 :00am:  Nothing by mouth.   At least one hour before going to the hospital:  Give yourself one Fleet enema. You may take your morning medications with sip of water unless we have instructed otherwise.      Please see below for Dietary Information.  CLEAR  LIQUIDS INCLUDE:  Water Jello (NOT red in color)   Ice Popsicles (NOT red in color)   Tea (sugar ok, no milk/cream) Powdered fruit flavored drinks  Coffee (sugar ok, no milk/cream) Gatorade/ Lemonade/ Kool-Aid  (NOT red in color)   Juice: apple, white grape, white cranberry Soft drinks  Clear bullion, consomme, broth (fat free beef/chicken/vegetable)  Carbonated beverages (any kind)  Strained chicken noodle soup Hard Candy   Remember: Clear liquids are liquids that will allow you to see your fingers on the other side of a clear glass. Be sure liquids are NOT red in color, and not cloudy, but CLEAR.  DO NOT EAT OR DRINK ANY OF THE FOLLOWING:  Dairy products of any kind   Cranberry juice Tomato juice / V8 juice   Grapefruit juice Orange juice     Red grape juice  Do not eat any solid foods, including such foods as: cereal, oatmeal, yogurt, fruits, vegetables, creamed soups, eggs, bread, crackers, pureed foods in a blender, etc.   HELPFUL HINTS FOR DRINKING PREP SOLUTION:   Make sure prep is extremely cold. Mix and refrigerate the the morning of the prep. You may also put in the freezer.   You may try mixing some Crystal Light or Country Time Lemonade if you prefer. Mix in small amounts; add more if necessary.  Try drinking through a straw  Rinse mouth with water or a mouthwash between  glasses, to remove after-taste.  Try sipping on a cold beverage /ice/ popsicles between glasses of prep.  Place a piece of sugar-free hard candy in mouth between glasses.  If you become nauseated, try consuming smaller amounts, or stretch out the time between glasses. Stop for 30-60 minutes, then slowly start back drinking.        OTHER INSTRUCTIONS  You will need a responsible adult at least 48 years of age to accompany you and drive you home. This person must remain in the waiting room during your procedure. The hospital will cancel your procedure if you do not have a responsible adult with  you.   1. Wear loose fitting clothing that is easily removed. 2. Leave jewelry and other valuables at home.  3. Remove all body piercing jewelry and leave at home. 4. Total time from sign-in until discharge is approximately 2-3 hours. 5. You should go home directly after your procedure and rest. You can resume normal activities the day after your procedure. 6. The day of your procedure you should not:  Drive  Make legal decisions  Operate machinery  Drink alcohol  Return to work   You may call the office (Dept: [phone]) before 5:00pm, or page the doctor on call ([phone]) after 5:00pm, for further instructions, if necessary.   Insurance Information YOU WILL NEED TO CHECK WITH YOUR INSURANCE COMPANY FOR THE BENEFITS OF COVERAGE YOU HAVE FOR THIS PROCEDURE.  UNFORTUNATELY, NOT ALL INSURANCE COMPANIES HAVE BENEFITS TO COVER ALL OR PART OF THESE TYPES OF PROCEDURES.  IT IS YOUR RESPONSIBILITY TO CHECK YOUR BENEFITS, HOWEVER, WE WILL BE GLAD TO ASSIST YOU WITH ANY CODES YOUR INSURANCE COMPANY MAY NEED.    PLEASE NOTE THAT MOST INSURANCE COMPANIES WILL NOT COVER A SCREENING COLONOSCOPY FOR PEOPLE UNDER THE AGE OF 50  IF YOU HAVE BCBS INSURANCE, YOU MAY HAVE BENEFITS FOR A SCREENING COLONOSCOPY BUT IF POLYPS ARE FOUND THE DIAGNOSIS WILL CHANGE AND THEN YOU MAY HAVE A DEDUCTIBLE THAT WILL NEED TO BE MET. SO PLEASE MAKE SURE YOU CHECK YOUR BENEFITS FOR A SCREENING COLONOSCOPY AS WELL AS A DIAGNOSTIC COLONOSCOPY.  

## 2017-05-01 DIAGNOSIS — R2240 Localized swelling, mass and lump, unspecified lower limb: Secondary | ICD-10-CM | POA: Diagnosis not present

## 2017-05-01 DIAGNOSIS — F39 Unspecified mood [affective] disorder: Secondary | ICD-10-CM | POA: Diagnosis not present

## 2017-05-01 DIAGNOSIS — Z6841 Body Mass Index (BMI) 40.0 and over, adult: Secondary | ICD-10-CM | POA: Diagnosis not present

## 2017-05-22 ENCOUNTER — Encounter (HOSPITAL_COMMUNITY)
Admission: RE | Disposition: A | Discharge status: Home / Self Care | Payer: Self-pay | Source: Ambulatory Visit | Attending: Internal Medicine

## 2017-05-22 ENCOUNTER — Other Ambulatory Visit: Payer: Self-pay

## 2017-05-22 ENCOUNTER — Ambulatory Visit (HOSPITAL_COMMUNITY)
Admission: RE | Admit: 2017-05-22 | Discharge: 2017-05-22 | Disposition: A | Discharge status: Home / Self Care | Payer: BLUE CROSS/BLUE SHIELD | Source: Ambulatory Visit | Attending: Internal Medicine | Admitting: Internal Medicine

## 2017-05-22 ENCOUNTER — Encounter (HOSPITAL_COMMUNITY): Payer: Self-pay

## 2017-05-22 DIAGNOSIS — M1990 Unspecified osteoarthritis, unspecified site: Secondary | ICD-10-CM | POA: Diagnosis not present

## 2017-05-22 DIAGNOSIS — Z8249 Family history of ischemic heart disease and other diseases of the circulatory system: Secondary | ICD-10-CM | POA: Insufficient documentation

## 2017-05-22 DIAGNOSIS — E669 Obesity, unspecified: Secondary | ICD-10-CM | POA: Diagnosis not present

## 2017-05-22 DIAGNOSIS — K760 Fatty (change of) liver, not elsewhere classified: Secondary | ICD-10-CM | POA: Diagnosis not present

## 2017-05-22 DIAGNOSIS — Z79899 Other long term (current) drug therapy: Secondary | ICD-10-CM | POA: Diagnosis not present

## 2017-05-22 DIAGNOSIS — Z885 Allergy status to narcotic agent status: Secondary | ICD-10-CM | POA: Insufficient documentation

## 2017-05-22 DIAGNOSIS — K3184 Gastroparesis: Secondary | ICD-10-CM | POA: Diagnosis not present

## 2017-05-22 DIAGNOSIS — Z1212 Encounter for screening for malignant neoplasm of rectum: Secondary | ICD-10-CM

## 2017-05-22 DIAGNOSIS — M109 Gout, unspecified: Secondary | ICD-10-CM | POA: Insufficient documentation

## 2017-05-22 DIAGNOSIS — E1143 Type 2 diabetes mellitus with diabetic autonomic (poly)neuropathy: Secondary | ICD-10-CM | POA: Insufficient documentation

## 2017-05-22 DIAGNOSIS — Z809 Family history of malignant neoplasm, unspecified: Secondary | ICD-10-CM | POA: Insufficient documentation

## 2017-05-22 DIAGNOSIS — Z6841 Body Mass Index (BMI) 40.0 and over, adult: Secondary | ICD-10-CM | POA: Diagnosis not present

## 2017-05-22 DIAGNOSIS — Z1211 Encounter for screening for malignant neoplasm of colon: Secondary | ICD-10-CM | POA: Diagnosis not present

## 2017-05-22 DIAGNOSIS — G4730 Sleep apnea, unspecified: Secondary | ICD-10-CM | POA: Insufficient documentation

## 2017-05-22 DIAGNOSIS — Z888 Allergy status to other drugs, medicaments and biological substances status: Secondary | ICD-10-CM | POA: Insufficient documentation

## 2017-05-22 DIAGNOSIS — R002 Palpitations: Secondary | ICD-10-CM | POA: Diagnosis not present

## 2017-05-22 DIAGNOSIS — I10 Essential (primary) hypertension: Secondary | ICD-10-CM | POA: Diagnosis not present

## 2017-05-22 DIAGNOSIS — Z87442 Personal history of urinary calculi: Secondary | ICD-10-CM | POA: Insufficient documentation

## 2017-05-22 DIAGNOSIS — Z7984 Long term (current) use of oral hypoglycemic drugs: Secondary | ICD-10-CM | POA: Diagnosis not present

## 2017-05-22 DIAGNOSIS — Z86010 Personal history of colonic polyps: Secondary | ICD-10-CM | POA: Insufficient documentation

## 2017-05-22 DIAGNOSIS — Z8371 Family history of colonic polyps: Secondary | ICD-10-CM | POA: Insufficient documentation

## 2017-05-22 DIAGNOSIS — Z8261 Family history of arthritis: Secondary | ICD-10-CM | POA: Insufficient documentation

## 2017-05-22 DIAGNOSIS — G8929 Other chronic pain: Secondary | ICD-10-CM | POA: Diagnosis not present

## 2017-05-22 DIAGNOSIS — Z811 Family history of alcohol abuse and dependence: Secondary | ICD-10-CM | POA: Insufficient documentation

## 2017-05-22 DIAGNOSIS — M542 Cervicalgia: Secondary | ICD-10-CM | POA: Insufficient documentation

## 2017-05-22 DIAGNOSIS — K5730 Diverticulosis of large intestine without perforation or abscess without bleeding: Secondary | ICD-10-CM | POA: Insufficient documentation

## 2017-05-22 DIAGNOSIS — R51 Headache: Secondary | ICD-10-CM | POA: Diagnosis not present

## 2017-05-22 DIAGNOSIS — K449 Diaphragmatic hernia without obstruction or gangrene: Secondary | ICD-10-CM | POA: Diagnosis not present

## 2017-05-22 DIAGNOSIS — Z803 Family history of malignant neoplasm of breast: Secondary | ICD-10-CM | POA: Insufficient documentation

## 2017-05-22 DIAGNOSIS — E785 Hyperlipidemia, unspecified: Secondary | ICD-10-CM | POA: Diagnosis not present

## 2017-05-22 DIAGNOSIS — Z825 Family history of asthma and other chronic lower respiratory diseases: Secondary | ICD-10-CM | POA: Insufficient documentation

## 2017-05-22 DIAGNOSIS — Z801 Family history of malignant neoplasm of trachea, bronchus and lung: Secondary | ICD-10-CM | POA: Insufficient documentation

## 2017-05-22 DIAGNOSIS — Z833 Family history of diabetes mellitus: Secondary | ICD-10-CM | POA: Insufficient documentation

## 2017-05-22 HISTORY — PX: COLONOSCOPY: SHX5424

## 2017-05-22 SURGERY — COLONOSCOPY
Anesthesia: Moderate Sedation

## 2017-05-22 MED ORDER — MEPERIDINE HCL 100 MG/ML IJ SOLN
INTRAMUSCULAR | Status: DC | PRN
  Administered 2017-05-22 (×2): 50 mg via INTRAVENOUS

## 2017-05-22 MED ORDER — MIDAZOLAM HCL 5 MG/5ML IJ SOLN
INTRAMUSCULAR | Status: DC | PRN
  Administered 2017-05-22: 1 mg via INTRAVENOUS
  Administered 2017-05-22 (×2): 2 mg via INTRAVENOUS

## 2017-05-22 MED ORDER — MIDAZOLAM HCL 5 MG/5ML IJ SOLN
INTRAMUSCULAR | Status: AC
  Filled 2017-05-22: qty 10

## 2017-05-22 MED ORDER — ONDANSETRON HCL 4 MG/2ML IJ SOLN
INTRAMUSCULAR | Status: AC
  Filled 2017-05-22: qty 2

## 2017-05-22 MED ORDER — ONDANSETRON HCL 4 MG/2ML IJ SOLN
INTRAMUSCULAR | Status: DC | PRN
  Administered 2017-05-22: 4 mg via INTRAVENOUS

## 2017-05-22 MED ORDER — STERILE WATER FOR IRRIGATION IR SOLN
Status: DC | PRN
  Administered 2017-05-22: 2.5 mL

## 2017-05-22 MED ORDER — SODIUM CHLORIDE 0.9 % IV SOLN
INTRAVENOUS | Status: DC
  Administered 2017-05-22: 11:00:00 via INTRAVENOUS

## 2017-05-22 MED ORDER — MEPERIDINE HCL 100 MG/ML IJ SOLN
INTRAMUSCULAR | Status: AC
  Filled 2017-05-22: qty 2

## 2017-05-22 NOTE — H&P (Signed)
@LOGO @   Primary Care Physician:  Celene Squibb, MD Primary Gastroenterologist:  Dr. Gala Romney  Pre-Procedure History & Physical: HPI:  Nicholas Turner is a 47 y.o. male here for here for surveillance colonoscopy. History of colonic adenoma. Currently, no lower GI tract symptoms.  Past Medical History:  Diagnosis Date  . Arthritis   . Chronic neck pain   . Diabetes mellitus   . Diverticulosis   . DM (diabetes mellitus) ([place]) 09/01/2010      . Fatty liver   . Gastroparesis   . Generalized headaches   . Gout   . Gout 11/26/2011  . Hyperlipidemia   . Hypertension   . HYPERTENSION, UNCONTROLLED 09/09/2009   Qualifier: Diagnosis of  By: Via LPN, Jeani Hawking    . Kidney stones   . Knee pain    feet   . Obesity (BMI 30-39.9) 09/01/2010  . Palpitation   . SLEEP APNEA 09/09/2009   Qualifier: Diagnosis of  By: Via LPN, Jeani Hawking    . UTI (lower urinary tract infection)     Past Surgical History:  Procedure Laterality Date  . COLONOSCOPY WITH ESOPHAGOGASTRODUODENOSCOPY (EGD) N/A 05/01/2012   MCE:YEMVVK esophagus. Small hiatal hernia. 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 pylori infection-status post biopsy (H.Pylori +). Minimally excoriated anal canal tissue. No hemorrhoids. Pancolonic diverticulosis. 8 mm polyp distal to the ICV, tubular adenoma. Next colonoscopy in April 2019.  Marland Kitchen ESOPHAGOGASTRODUODENOSCOPY ENDOSCOPY    . KIDNEY STONE SURGERY     post ureteral stent and status post lithotripsy (10-15 yrs Ago)    Prior to Admission medications   Medication Sig Start Date End Date Taking? Authorizing Provider  buPROPion (WELLBUTRIN XL) 150 MG 24 hr tablet Take 150 mg by mouth daily.   Yes [provider]  furosemide (LASIX) 20 MG tablet Take 20 mg by mouth daily as needed for fluid.   Yes [provider]  metoprolol (LOPRESSOR) 100 MG tablet Take 1 tablet (100 mg total) by mouth 2 (two) times daily. 10/20/12  Yes Satira Sark, MD  polyethylene  glycol-electrolytes (TRILYTE) 420 g solution Take 4,000 mLs by mouth as directed. 04/04/17  Yes Annitta Needs, NP  pravastatin (PRAVACHOL) 20 MG tablet TAKE ONE TABLET BY MOUTH ONCE DAILY Patient taking differently: Take 20 mg by mouth at bedtime 04/14/13  Yes Lendon Colonel, NP  telmisartan-hydrochlorothiazide (MICARDIS HCT) 80-25 MG tablet Take 1 tablet by mouth daily.  03/11/17  Yes [provider]  metFORMIN (GLUCOPHAGE) 500 MG tablet TAKE TWO TABLETS BY MOUTH TWICE DAILY WITH A MEAL Patient not taking: Reported on 09/10/2014 12/15/12 09/10/14  Biagio Borg, MD    Allergies as of 04/04/2017 - Review Complete 04/04/2017  Allergen Reaction Noted  . Clonidine derivatives Other (See Comments) 05/10/2010  . Oxycodone Nausea And Vomiting 09/01/2010    Family History  Problem Relation Age of Onset  . Arthritis Mother   . Colon polyps Mother   . Arthritis Other   . Heart disease Other   . Hypertension Other   . Sudden death Other   . Arthritis Other   . Heart disease Other   . Hypertension Other   . Alcohol abuse Other   . Cancer Other   . Asthma Other        breast cancer  . Heart disease Other   . Hypertension Other   . Diabetes Other   . Lung cancer Maternal Uncle   . Breast cancer Maternal Aunt   .  Colon polyps Sister        age 48  . Colon cancer Neg Hx     Social History   Socioeconomic History  . Marital status: Married    Spouse name: Not on file  . Number of children: 3  . Years of education: Not on file  . Highest education level: Not on file  Occupational History  . Occupation: full time     Employer: [employer]    Comment: pest control  Social Needs  . Financial resource strain: Not on file  . Food insecurity:    Worry: Not on file    Inability: Not on file  . Transportation needs:    Medical: Not on file    Non-medical: Not on file  Tobacco Use  . Smoking status: Never Smoker  . Smokeless tobacco: Never Used  . Tobacco comment:  Never smoker  Substance and Sexual Activity  . Alcohol use: No  . Drug use: No  . Sexual activity: Not on file  Lifestyle  . Physical activity:    Days per week: Not on file    Minutes per session: Not on file  . Stress: Not on file  Relationships  . Social connections:    Talks on phone: Not on file    Gets together: Not on file    Attends religious service: Not on file    Active member of club or organization: Not on file    Attends meetings of clubs or organizations: Not on file    Relationship status: Not on file  . Intimate partner violence:    Fear of current or ex partner: Not on file    Emotionally abused: Not on file    Physically abused: Not on file    Forced sexual activity: Not on file  Other Topics Concern  . Not on file  Social History Narrative  . Not on file    Review of Systems: See HPI, otherwise negative ROS  Physical Exam: BP (!) 194/113   Pulse 60   Temp 98.3 F (36.8 C) (Oral)   Resp 16   Ht 5\' 9"  (1.753 m)   Wt 288 lb (130.6 kg)   SpO2 99%   BMI 42.53 kg/m  General:   Alert,  Well-developed, well-nourished, pleasant and cooperative in NAD Skin:  Intact without significant lesions or rashes. Neck:  Supple; no masses or thyromegaly. No significant cervical adenopathy. Lungs:  Clear throughout to auscultation.   No wheezes, crackles, or rhonchi. No acute distress. Heart:  Regular rate and rhythm; no murmurs, clicks, rubs,  or gallops. Abdomen: Non-distended, normal bowel sounds.  Soft and nontender without appreciable mass or hepatosplenomegaly.  Pulses:  Normal pulses noted. Extremities:  Without clubbing or edema.  Impression:  47 year old gentleman history of colonic adenoma; here for surveillance colonoscopy.  Recommendations:  I have offered the patient a surveillance colonoscopy per plan.  The risks, benefits, limitations, alternatives and imponderables have been reviewed with the patient. Questions have been answered. All parties are  agreeable.      Notice: This dictation was prepared with Dragon dictation along with smaller phrase technology. Any transcriptional errors that result from this process are unintentional and may not be corrected upon review.

## 2017-05-22 NOTE — Op Note (Signed)
[facility] Patient Name: Nicholas Turner Procedure Date: 05/22/2017 11:01 AM MRN: [id number] Date of Birth: [birth year] Attending MD: Norvel Richards , MD CSN: [id number] Age: 47 Admit Type: Outpatient Procedure:                Colonoscopy Indications:              Screening for colorectal malignant neoplasm Providers:                Norvel Richards, MD, Lurline Del, RN, Randa Spike, Technician Referring MD:              Medicines:                Midazolam 5 mg IV, Meperidine 100 mg IV,                            Ondansetron 4 mg IV Complications:            No immediate complications. Estimated Blood Loss:     Estimated blood loss: none. Estimated blood loss:                            none. Procedure:                Pre-Anesthesia Assessment:                           - Prior to the procedure, a History and Physical                            was performed, and patient medications and                            allergies were reviewed. The patient's tolerance of                            previous anesthesia was also reviewed. The risks                            and benefits of the procedure and the sedation                            options and risks were discussed with the patient.                            All questions were answered, and informed consent                            was obtained. Prior Anticoagulants: The patient has                            taken no previous anticoagulant or antiplatelet  agents. ASA Grade Assessment: II - A patient with                            mild systemic disease. After reviewing the risks                            and benefits, the patient was deemed in                            satisfactory condition to undergo the procedure.                           After obtaining informed consent, the colonoscope                            was passed under direct vision.  Throughout the                            procedure, the patient's blood pressure, pulse, and                            oxygen saturations were monitored continuously. The                            EC-[id number] ([id number]) scope was introduced through                            the anus and advanced to the the cecum, identified                            by appendiceal orifice and ileocecal valve. The                            colonoscopy was performed without difficulty. The                            patient tolerated the procedure well. The quality                            of the bowel preparation was adequate. The                            ileocecal valve, appendiceal orifice, and rectum                            were photographed. The entire colon was well                            visualized. Scope In: 11:27:24 AM Scope Out: 11:38:21 AM Scope Withdrawal Time: 0 hours 6 minutes 46 seconds  Total Procedure Duration: 0 hours 10 minutes 57 seconds  Findings:      Many small and large-mouthed diverticula were found in the entire colon.      The exam was otherwise without abnormality on  direct and retroflexion       views. Impression:               - Diverticulosis in the entire examined colon.                           - The examination was otherwise normal on direct                            and retroflexion views.                           - No specimens collected. Moderate Sedation:      Moderate (conscious) sedation was administered by the endoscopy nurse       and supervised by the endoscopist. The following parameters were       monitored: oxygen saturation, heart rate, blood pressure, respiratory       rate, EKG, adequacy of pulmonary ventilation, and response to care.       Total physician intraservice time was 16 minutes. Recommendation:           - Patient has a contact number available for                            emergencies. The signs and symptoms of potential                             delayed complications were discussed with the                            patient. Return to normal activities tomorrow.                            Written discharge instructions were provided to the                            patient.                           - Resume previous diet.                           - Continue present medications.                           - Repeat colonoscopy in 5 years for surveillance.                           - Return to GI office PRN. Procedure Code(s):        --- Professional ---                           [phone], Colonoscopy, flexible; diagnostic, including                            collection of specimen(s) by brushing or washing,  when performed (separate procedure)                           G0500, Moderate sedation services provided by the                            same physician or other qualified health care                            professional performing a gastrointestinal                            endoscopic service that sedation supports,                            requiring the presence of an independent trained                            observer to assist in the monitoring of the                            patient's level of consciousness and physiological                            status; initial 15 minutes of intra-service time;                            patient age 68 years or older (additional time may                            be reported with [phone], as appropriate) Diagnosis Code(s):        --- Professional ---                           Z12.11, Encounter for screening for malignant                            neoplasm of colon                           K57.30, Diverticulosis of large intestine without                            perforation or abscess without bleeding CPT copyright 2017 American [employer]. All rights reserved. The codes documented in this report are  preliminary and upon coder review may  be revised to meet current compliance requirements. Cristopher Estimable. Kawena Lyday, MD Norvel Richards, MD 05/22/2017 11:45:19 AM This report has been signed electronically. Number of Addenda: 0

## 2017-05-22 NOTE — Discharge Instructions (Signed)
°Colonoscopy °Discharge Instructions ° °Read the instructions outlined below and refer to this sheet in the next few weeks. These discharge instructions provide you with general information on caring for yourself after you leave the hospital. Your doctor may also give you specific instructions. While your treatment has been planned according to the most current medical practices available, unavoidable complications occasionally occur. If you have any problems or questions after discharge, call Dr. Rourk at [phone]. °ACTIVITY °· You may resume your regular activity, but move at a slower pace for the next 24 hours.  °· Take frequent rest periods for the next 24 hours.  °· Walking will help get rid of the air and reduce the bloated feeling in your belly (abdomen).  °· No driving for 24 hours (because of the medicine (anesthesia) used during the test).   °· Do not sign any important legal documents or operate any machinery for 24 hours (because of the anesthesia used during the test).  °NUTRITION °· Drink plenty of fluids.  °· You may resume your normal diet as instructed by your doctor.  °· Begin with a light meal and progress to your normal diet. Heavy or fried foods are harder to digest and may make you feel sick to your stomach (nauseated).  °· Avoid alcoholic beverages for 24 hours or as instructed.  °MEDICATIONS °· You may resume your normal medications unless your doctor tells you otherwise.  °WHAT YOU CAN EXPECT TODAY °· Some feelings of bloating in the abdomen.  °· Passage of more gas than usual.  °· Spotting of blood in your stool or on the toilet paper.  °IF YOU HAD POLYPS REMOVED DURING THE COLONOSCOPY: °· No aspirin products for 7 days or as instructed.  °· No alcohol for 7 days or as instructed.  °· Eat a soft diet for the next 24 hours.  °FINDING OUT THE RESULTS OF YOUR TEST °Not all test results are available during your visit. If your test results are not back during the visit, make an appointment  with your caregiver to find out the results. Do not assume everything is normal if you have not heard from your caregiver or the medical facility. It is important for you to follow up on all of your test results.  °SEEK IMMEDIATE MEDICAL ATTENTION IF: °· You have more than a spotting of blood in your stool.  °· Your belly is swollen (abdominal distention).  °· You are nauseated or vomiting.  °· You have a temperature over 101.  °· You have abdominal pain or discomfort that is severe or gets worse throughout the day.  ° ° °Diverticulosis information provided ° °Repeat colonoscopy in 5 years ° ° °Diverticulosis °Diverticulosis is a condition that develops when small pouches (diverticula) form in the wall of the large intestine (colon). The colon is where water is absorbed and stool is formed. The pouches form when the inside layer of the colon pushes through weak spots in the outer layers of the colon. You may have a few pouches or many of them. °What are the causes? °The cause of this condition is not known. °What increases the risk? °The following factors may make you more likely to develop this condition: °· Being older than age 60. Your risk for this condition increases with age. Diverticulosis is rare among people younger than age 30. By age 80, many people have it. °· Eating a low-fiber diet. °· Having frequent constipation. °· Being overweight. °· Not getting enough exercise. °· Smoking. °·   Taking over-the-counter pain medicines, like aspirin and ibuprofen. °· Having a family history of diverticulosis. ° °What are the signs or symptoms? °In most people, there are no symptoms of this condition. If you do have symptoms, they may include: °· Bloating. °· Cramps in the abdomen. °· Constipation or diarrhea. °· Pain in the lower left side of the abdomen. ° °How is this diagnosed? °This condition is most often diagnosed during an exam for other colon problems. Because diverticulosis usually has no symptoms, it often  cannot be diagnosed independently. This condition may be diagnosed by: °· Using a flexible scope to examine the colon (colonoscopy). °· Taking an X-ray of the colon after dye has been put into the colon (barium enema). °· Doing a CT scan. ° °How is this treated? °You may not need treatment for this condition if you have never developed an infection related to diverticulosis. If you have had an infection before, treatment may include: °· Eating a high-fiber diet. This may include eating more fruits, vegetables, and grains. °· Taking a fiber supplement. °· Taking a live bacteria supplement (probiotic). °· Taking medicine to relax your colon. °· Taking antibiotic medicines. ° °Follow these instructions at home: °· Drink 6-8 glasses of water or more each day to prevent constipation. °· Try not to strain when you have a bowel movement. °· If you have had an infection before: °? Eat more fiber as directed by your health care provider or your diet and nutrition specialist (dietitian). °? Take a fiber supplement or probiotic, if your health care provider approves. °· Take over-the-counter and prescription medicines only as told by your health care provider. °· If you were prescribed an antibiotic, take it as told by your health care provider. Do not stop taking the antibiotic even if you start to feel better. °· Keep all follow-up visits as told by your health care provider. This is important. °Contact a health care provider if: °· You have pain in your abdomen. °· You have bloating. °· You have cramps. °· You have not had a bowel movement in 3 days. °Get help right away if: °· Your pain gets worse. °· Your bloating becomes very bad. °· You have a fever or chills, and your symptoms suddenly get worse. °· You vomit. °· You have bowel movements that are bloody or black. °· You have bleeding from your rectum. °Summary °· Diverticulosis is a condition that develops when small pouches (diverticula) form in the wall of the large  intestine (colon). °· You may have a few pouches or many of them. °· This condition is most often diagnosed during an exam for other colon problems. °· If you have had an infection related to diverticulosis, treatment may include increasing the fiber in your diet, taking supplements, or taking medicines. °This information is not intended to replace advice given to you by your health care provider. Make sure you discuss any questions you have with your health care provider. °Document Released: 10/06/2003 Document Revised: 11/28/2015 Document Reviewed: 11/28/2015 °Elsevier Interactive Patient Education © 2017 [employer] ° °

## 2017-05-28 ENCOUNTER — Encounter (HOSPITAL_COMMUNITY): Payer: Self-pay | Admitting: Internal Medicine

## 2017-10-31 DIAGNOSIS — M791 Myalgia, unspecified site: Secondary | ICD-10-CM | POA: Diagnosis not present

## 2017-10-31 DIAGNOSIS — I10 Essential (primary) hypertension: Secondary | ICD-10-CM | POA: Diagnosis not present

## 2017-10-31 DIAGNOSIS — E119 Type 2 diabetes mellitus without complications: Secondary | ICD-10-CM | POA: Diagnosis not present

## 2017-10-31 DIAGNOSIS — E782 Mixed hyperlipidemia: Secondary | ICD-10-CM | POA: Diagnosis not present

## 2017-11-05 DIAGNOSIS — Z23 Encounter for immunization: Secondary | ICD-10-CM | POA: Diagnosis not present

## 2017-11-05 DIAGNOSIS — E782 Mixed hyperlipidemia: Secondary | ICD-10-CM | POA: Diagnosis not present

## 2017-11-05 DIAGNOSIS — Z0000 Encounter for general adult medical examination without abnormal findings: Secondary | ICD-10-CM | POA: Diagnosis not present

## 2017-11-05 DIAGNOSIS — I10 Essential (primary) hypertension: Secondary | ICD-10-CM | POA: Diagnosis not present

## 2017-11-06 ENCOUNTER — Other Ambulatory Visit (HOSPITAL_COMMUNITY): Payer: Self-pay | Admitting: Internal Medicine

## 2017-11-06 DIAGNOSIS — R0602 Shortness of breath: Secondary | ICD-10-CM

## 2017-11-18 DIAGNOSIS — L720 Epidermal cyst: Secondary | ICD-10-CM | POA: Diagnosis not present

## 2017-11-18 DIAGNOSIS — L814 Other melanin hyperpigmentation: Secondary | ICD-10-CM | POA: Diagnosis not present

## 2017-11-18 DIAGNOSIS — L821 Other seborrheic keratosis: Secondary | ICD-10-CM | POA: Diagnosis not present

## 2017-11-20 ENCOUNTER — Ambulatory Visit (HOSPITAL_COMMUNITY)
Admission: RE | Admit: 2017-11-20 | Discharge: 2017-11-20 | Disposition: A | Discharge status: Home / Self Care | Payer: BLUE CROSS/BLUE SHIELD | Source: Ambulatory Visit | Attending: Internal Medicine | Admitting: Internal Medicine

## 2017-11-20 DIAGNOSIS — E119 Type 2 diabetes mellitus without complications: Secondary | ICD-10-CM | POA: Insufficient documentation

## 2017-11-20 DIAGNOSIS — Z6841 Body Mass Index (BMI) 40.0 and over, adult: Secondary | ICD-10-CM | POA: Insufficient documentation

## 2017-11-20 DIAGNOSIS — E669 Obesity, unspecified: Secondary | ICD-10-CM | POA: Insufficient documentation

## 2017-11-20 DIAGNOSIS — R0602 Shortness of breath: Secondary | ICD-10-CM

## 2017-11-20 DIAGNOSIS — G4730 Sleep apnea, unspecified: Secondary | ICD-10-CM | POA: Diagnosis not present

## 2017-11-20 DIAGNOSIS — I10 Essential (primary) hypertension: Secondary | ICD-10-CM | POA: Insufficient documentation

## 2017-11-20 DIAGNOSIS — E785 Hyperlipidemia, unspecified: Secondary | ICD-10-CM | POA: Insufficient documentation

## 2017-11-20 NOTE — Progress Notes (Signed)
*  PRELIMINARY RESULTS* Echocardiogram 2D Echocardiogram has been performed.  Nicholas Turner 11/20/2017, 11:12 AM

## 2017-11-26 DIAGNOSIS — E782 Mixed hyperlipidemia: Secondary | ICD-10-CM | POA: Diagnosis not present

## 2017-11-26 DIAGNOSIS — F331 Major depressive disorder, recurrent, moderate: Secondary | ICD-10-CM | POA: Diagnosis not present

## 2017-11-26 DIAGNOSIS — R0602 Shortness of breath: Secondary | ICD-10-CM | POA: Diagnosis not present

## 2017-11-26 DIAGNOSIS — M109 Gout, unspecified: Secondary | ICD-10-CM | POA: Diagnosis not present

## 2017-12-02 DIAGNOSIS — L821 Other seborrheic keratosis: Secondary | ICD-10-CM | POA: Diagnosis not present

## 2018-03-03 ENCOUNTER — Other Ambulatory Visit: Payer: Self-pay

## 2018-03-03 ENCOUNTER — Encounter (HOSPITAL_COMMUNITY): Payer: Self-pay | Admitting: Emergency Medicine

## 2018-03-03 ENCOUNTER — Emergency Department (HOSPITAL_COMMUNITY)
Admission: EM | Admit: 2018-03-03 | Discharge: 2018-03-03 | Disposition: A | Discharge status: Home / Self Care | Payer: BLUE CROSS/BLUE SHIELD | Attending: Emergency Medicine | Admitting: Emergency Medicine

## 2018-03-03 DIAGNOSIS — I10 Essential (primary) hypertension: Secondary | ICD-10-CM | POA: Diagnosis not present

## 2018-03-03 DIAGNOSIS — Z79899 Other long term (current) drug therapy: Secondary | ICD-10-CM | POA: Insufficient documentation

## 2018-03-03 DIAGNOSIS — E119 Type 2 diabetes mellitus without complications: Secondary | ICD-10-CM | POA: Diagnosis not present

## 2018-03-03 DIAGNOSIS — M109 Gout, unspecified: Secondary | ICD-10-CM | POA: Diagnosis not present

## 2018-03-03 DIAGNOSIS — M79606 Pain in leg, unspecified: Secondary | ICD-10-CM | POA: Diagnosis not present

## 2018-03-03 MED ORDER — DEXAMETHASONE 4 MG PO TABS: 4.0000 mg | ORAL_TABLET | Freq: Two times a day (BID) | ORAL | 0 refills | Status: DC

## 2018-03-03 MED ORDER — HYDROCODONE-ACETAMINOPHEN 5-325 MG PO TABS: 1.0000 | ORAL_TABLET | ORAL | 0 refills | Status: DC | PRN

## 2018-03-03 MED ORDER — DEXAMETHASONE SODIUM PHOSPHATE 10 MG/ML IJ SOLN
10.0000 mg | Freq: Once | INTRAMUSCULAR | Status: AC
  Administered 2018-03-03: 10 mg via INTRAMUSCULAR
  Filled 2018-03-03: qty 1

## 2018-03-03 MED ORDER — ONDANSETRON HCL 4 MG PO TABS: 4.0000 mg | ORAL_TABLET | Freq: Four times a day (QID) | ORAL | 0 refills | Status: DC | PRN

## 2018-03-03 MED ORDER — MORPHINE SULFATE (PF) 10 MG/ML IV SOLN: 10.0000 mg | Freq: Once | INTRAVENOUS | Status: DC

## 2018-03-03 MED ORDER — PROMETHAZINE HCL 12.5 MG PO TABS
12.5000 mg | ORAL_TABLET | Freq: Once | ORAL | Status: AC
  Administered 2018-03-03: 12.5 mg via ORAL
  Filled 2018-03-03: qty 1

## 2018-03-03 MED ORDER — MORPHINE SULFATE (PF) 4 MG/ML IV SOLN
8.0000 mg | Freq: Once | INTRAVENOUS | Status: AC
  Administered 2018-03-03: 8 mg via INTRAMUSCULAR
  Filled 2018-03-03: qty 2

## 2018-03-03 MED ORDER — MELOXICAM 15 MG PO TABS: 15.0000 mg | ORAL_TABLET | Freq: Every day | ORAL | 0 refills | Status: DC

## 2018-03-03 MED ORDER — COLCHICINE 0.6 MG PO TABS
0.6000 mg | ORAL_TABLET | Freq: Once | ORAL | Status: AC
  Administered 2018-03-03: 0.6 mg via ORAL
  Filled 2018-03-03: qty 1

## 2018-03-03 MED ORDER — KETOROLAC TROMETHAMINE 10 MG PO TABS
10.0000 mg | ORAL_TABLET | Freq: Once | ORAL | Status: AC
  Administered 2018-03-03: 10 mg via ORAL
  Filled 2018-03-03: qty 1

## 2018-03-03 NOTE — Discharge Instructions (Addendum)
You were treated in the emergency department with a narcotic injection.  Please use caution getting around.  Keep your knees and toes warm as much as possible.  Use caution getting around.  Use Decadron 2 times daily with food.  Use Mobic daily.  Use Norco for more severe pain. This medication may cause drowsiness. Please do not drink, drive, or participate in activity that requires concentration while taking this medication.  May use Zofran for nausea if needed.  Please see Dr. Nevada Crane for follow-up and additional evaluation concerning your gout flareup.  Return to the emergency department if any emergent changes in your condition, problems, or concerns.

## 2018-03-03 NOTE — ED Triage Notes (Signed)
Pt reports history of gout.  Pt c/o pain in legs and feet x 1 week.

## 2018-03-03 NOTE — ED Provider Notes (Signed)
[facility] Provider Note   CSN: [id number] Arrival date & time: 03/03/18  1249     History   Chief Complaint Chief Complaint  Patient presents with  . Leg Pain    HPI Nicholas Turner is a 48 y.o. male.  Patient is a 48 year old male who presents to the emergency department with a complaint of pain in the feet and knees.  The patient states he has a history of gout, and degenerative joint disease.  Patient states this episode of pain in his toe and in both knees feels very similar to previous gout attacks.  He has been trying medications at home, but these have not been successful.  The patient states that he was having good success from colchicine, but his insurance will no longer cover colchicine, and it seems as though it takes longer for him to get over his gout attacks now.  No recent injury or trauma.  No recent operations or procedures involving the lower extremities.  The patient has not been on any excessively long trips, and has not had any long periods of inactivity.  The history is provided by the patient.  Leg Pain  Associated symptoms: no back pain and no neck pain     Past Medical History:  Diagnosis Date  . Arthritis   . Chronic neck pain   . Diabetes mellitus   . Diverticulosis   . DM (diabetes mellitus) ([place]) 09/01/2010      . Fatty liver   . Gastroparesis   . Generalized headaches   . Gout   . Gout 11/26/2011  . Hyperlipidemia   . Hypertension   . HYPERTENSION, UNCONTROLLED 09/09/2009   Qualifier: Diagnosis of  By: Via LPN, Jeani Hawking    . Kidney stones   . Knee pain    feet   . Obesity (BMI 30-39.9) 09/01/2010  . Palpitation   . SLEEP APNEA 09/09/2009   Qualifier: Diagnosis of  By: Via LPN, Jeani Hawking    . UTI (lower urinary tract infection)     Patient Active Problem List   Diagnosis Date Noted  . Personal history of colonic polyps 06/12/2012  . Family history of colonic polyps 04/21/2012  . Hematochezia 04/10/2012  . Abnormal  liver function 04/10/2012  . Gout 11/26/2011  . Preventative health care 11/26/2011  . Right knee pain 11/26/2011  . Right ankle pain 11/26/2011  . Arthritis   . Hyperlipidemia   . Kidney stones   . Nausea alone 04/16/2011  . Obesity (BMI 30-39.9) 09/01/2010  . DM (diabetes mellitus) ([place]) 09/01/2010  . HYPERTENSION, UNCONTROLLED 09/09/2009  . SLEEP APNEA 09/09/2009    Past Surgical History:  Procedure Laterality Date  . COLONOSCOPY N/A 05/22/2017   Procedure: COLONOSCOPY;  Surgeon: Daneil Dolin, MD;  Location: [facility];  Service: Endoscopy;  Laterality: N/A;  12:00  . COLONOSCOPY WITH ESOPHAGOGASTRODUODENOSCOPY (EGD) N/A 05/01/2012   NWG:NFAOZH esophagus. Small hiatal hernia. 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 pylori infection-status post biopsy (H.Pylori +). Minimally excoriated anal canal tissue. No hemorrhoids. Pancolonic diverticulosis. 8 mm polyp distal to the ICV, tubular adenoma. Next colonoscopy in April 2019.  Marland Kitchen ESOPHAGOGASTRODUODENOSCOPY ENDOSCOPY    . KIDNEY STONE SURGERY     post ureteral stent and status post lithotripsy (10-15 yrs Ago)        Home Medications    Prior to Admission medications   Medication Sig Start Date End Date Taking? Authorizing Provider  buPROPion (WELLBUTRIN XL) 150 MG  24 hr tablet Take 150 mg by mouth daily.    [provider]  furosemide (LASIX) 20 MG tablet Take 20 mg by mouth daily as needed for fluid.    [provider]  metoprolol (LOPRESSOR) 100 MG tablet Take 1 tablet (100 mg total) by mouth 2 (two) times daily. 10/20/12   Satira Sark, MD  polyethylene glycol-electrolytes (TRILYTE) 420 g solution Take 4,000 mLs by mouth as directed. 04/04/17   Annitta Needs, NP  pravastatin (PRAVACHOL) 20 MG tablet TAKE ONE TABLET BY MOUTH ONCE DAILY Patient taking differently: Take 20 mg by mouth at bedtime 04/14/13   Lendon Colonel, NP  telmisartan-hydrochlorothiazide (MICARDIS HCT)  80-25 MG tablet Take 1 tablet by mouth daily.  03/11/17   [provider]    Family History Family History  Problem Relation Age of Onset  . Arthritis Mother   . Colon polyps Mother   . Arthritis Other   . Heart disease Other   . Hypertension Other   . Sudden death Other   . Arthritis Other   . Heart disease Other   . Hypertension Other   . Alcohol abuse Other   . Cancer Other   . Asthma Other        breast cancer  . Heart disease Other   . Hypertension Other   . Diabetes Other   . Lung cancer Maternal Uncle   . Breast cancer Maternal Aunt   . Colon polyps Sister        age 55  . Colon cancer Neg Hx     Social History Social History   Tobacco Use  . Smoking status: Never Smoker  . Smokeless tobacco: Never Used  . Tobacco comment: Never smoker  Substance Use Topics  . Alcohol use: No  . Drug use: No     Allergies   Clonidine derivatives and Oxycodone   Review of Systems Review of Systems  Constitutional: Negative for activity change.       All ROS Neg except as noted in HPI  HENT: Negative for nosebleeds.   Eyes: Negative for photophobia and discharge.  Respiratory: Negative for cough, shortness of breath and wheezing.   Cardiovascular: Negative for chest pain and palpitations.  Gastrointestinal: Negative for abdominal pain and blood in stool.  Genitourinary: Negative for dysuria, frequency and hematuria.  Musculoskeletal: Positive for arthralgias. Negative for back pain and neck pain.  Skin: Negative.   Neurological: Negative for dizziness, seizures and speech difficulty.  Psychiatric/Behavioral: Negative for confusion and hallucinations.     Physical Exam Updated Vital Signs BP 126/78   Pulse 62   Temp 98.8 F (37.1 C) (Oral)   Resp 16   Ht 5\' 9"  (1.753 m)   Wt (!) 138.3 kg   SpO2 99%   BMI 45.04 kg/m   Physical Exam Vitals signs and nursing note reviewed.  Constitutional:      Appearance: He is well-developed. He is not  toxic-appearing.  HENT:     Head: Normocephalic.     Right Ear: Tympanic membrane and external ear normal.     Left Ear: Tympanic membrane and external ear normal.  Eyes:     General: Lids are normal.     Pupils: Pupils are equal, round, and reactive to light.  Neck:     Musculoskeletal: Normal range of motion and neck supple.     Vascular: No carotid bruit.  Cardiovascular:     Rate and Rhythm: Normal rate and regular  rhythm.     Pulses: Normal pulses.     Heart sounds: Normal heart sounds.  Pulmonary:     Effort: No respiratory distress.     Breath sounds: Normal breath sounds.  Abdominal:     General: Bowel sounds are normal.     Palpations: Abdomen is soft.     Tenderness: There is no abdominal tenderness. There is no guarding.  Musculoskeletal: Normal range of motion.     Comments: There is some swelling of the left knee.  The left knee is warm.  There is soreness of the left big toe, but no significant swelling pelvic.  There is swelling at the MP joint of the big toe on the right.  The area is warm to touch.  The area is tender to even minimal palpation.  There is minimal swelling of the right knee.  And the right knee is not hot.  Dorsalis pedis pulses 2+ on the right.  Lymphadenopathy:     Head:     Right side of head: No submandibular adenopathy.     Left side of head: No submandibular adenopathy.     Cervical: No cervical adenopathy.  Skin:    General: Skin is warm and dry.  Neurological:     Mental Status: He is alert and oriented to person, place, and time.     Cranial Nerves: No cranial nerve deficit.     Sensory: No sensory deficit.  Psychiatric:        Speech: Speech normal.      ED Treatments / Results  Labs (all labs ordered are listed, but only abnormal results are displayed) Labs Reviewed - No data to display  EKG None  Radiology No results found.  Procedures Procedures (including critical care time)  Medications Ordered in ED Medications    colchicine tablet 0.6 mg (0.6 mg Oral Given 03/03/18 1329)  dexamethasone (DECADRON) injection 10 mg (10 mg Intramuscular Given 03/03/18 1329)  promethazine (PHENERGAN) tablet 12.5 mg (12.5 mg Oral Given 03/03/18 1329)  ketorolac (TORADOL) tablet 10 mg (10 mg Oral Given 03/03/18 1329)  morphine 4 MG/ML injection 8 mg (8 mg Intramuscular Given 03/03/18 1329)     Initial Impression / Assessment and Plan / ED Course  I have reviewed the triage vital signs and the nursing notes.  Pertinent labs & imaging results that were available during my care of the patient were reviewed by me and considered in my medical decision making (see chart for details).       Final Clinical Impressions(s) / ED Diagnoses MDM  Patient's blood pressure is elevated at 171/113.  The patient states that he is having a great deal of pain, and has had pain over the last few days in spite of his home medications.  Patient treated in the emergency department with intramuscular Decadron, Toradol, and intramuscular morphine.  Patient also given a dose of colchicine.  Recheck patient states he is feeling much better.  There is no evidence of any adverse reaction to the medications.  The patient speaks in complete sentences.  There is no rash noted.  No oral swelling appreciated.  Patient is ambulatory with a lot less pain and discomfort.  Patient is to call Dr. Nevada Crane for office appointment as soon as possible for follow-up.  Prescription for Decadron, meloxicam, and Norco given to the patient.  I have discussed with the patient to monitor his glucose very carefully since he will be taking a steroid, and has a history of  diabetes.  Patient is in agreement with this plan.   Final diagnoses:  Exacerbation of gout    ED Discharge Orders         Ordered    dexamethasone (DECADRON) 4 MG tablet  2 times daily with meals     03/03/18 1453    meloxicam (MOBIC) 15 MG tablet  Daily     03/03/18 1453    HYDROcodone-acetaminophen  (NORCO/VICODIN) 5-325 MG tablet  Every 4 hours PRN     03/03/18 1453    ondansetron (ZOFRAN) 4 MG tablet  Every 6 hours PRN     03/03/18 1453           Lily Kocher, PA-C 03/03/18 [place], [place], DO 03/05/18 1905

## 2018-03-17 DIAGNOSIS — E1169 Type 2 diabetes mellitus with other specified complication: Secondary | ICD-10-CM | POA: Diagnosis not present

## 2018-03-17 DIAGNOSIS — E782 Mixed hyperlipidemia: Secondary | ICD-10-CM | POA: Diagnosis not present

## 2018-03-17 DIAGNOSIS — I10 Essential (primary) hypertension: Secondary | ICD-10-CM | POA: Diagnosis not present

## 2018-03-21 DIAGNOSIS — E1169 Type 2 diabetes mellitus with other specified complication: Secondary | ICD-10-CM | POA: Diagnosis not present

## 2018-03-21 DIAGNOSIS — R945 Abnormal results of liver function studies: Secondary | ICD-10-CM | POA: Diagnosis not present

## 2018-03-21 DIAGNOSIS — E782 Mixed hyperlipidemia: Secondary | ICD-10-CM | POA: Diagnosis not present

## 2018-03-21 DIAGNOSIS — I10 Essential (primary) hypertension: Secondary | ICD-10-CM | POA: Diagnosis not present

## 2018-07-11 DIAGNOSIS — I10 Essential (primary) hypertension: Secondary | ICD-10-CM | POA: Diagnosis not present

## 2018-07-11 DIAGNOSIS — E1169 Type 2 diabetes mellitus with other specified complication: Secondary | ICD-10-CM | POA: Diagnosis not present

## 2018-07-11 DIAGNOSIS — E782 Mixed hyperlipidemia: Secondary | ICD-10-CM | POA: Diagnosis not present

## 2018-07-18 DIAGNOSIS — R945 Abnormal results of liver function studies: Secondary | ICD-10-CM | POA: Diagnosis not present

## 2018-07-18 DIAGNOSIS — Z0001 Encounter for general adult medical examination with abnormal findings: Secondary | ICD-10-CM | POA: Diagnosis not present

## 2018-07-18 DIAGNOSIS — E1169 Type 2 diabetes mellitus with other specified complication: Secondary | ICD-10-CM | POA: Diagnosis not present

## 2018-07-18 DIAGNOSIS — R944 Abnormal results of kidney function studies: Secondary | ICD-10-CM | POA: Diagnosis not present

## 2018-11-24 DIAGNOSIS — Z23 Encounter for immunization: Secondary | ICD-10-CM | POA: Diagnosis not present

## 2019-01-05 DIAGNOSIS — I10 Essential (primary) hypertension: Secondary | ICD-10-CM | POA: Diagnosis not present

## 2019-01-05 DIAGNOSIS — E1169 Type 2 diabetes mellitus with other specified complication: Secondary | ICD-10-CM | POA: Diagnosis not present

## 2019-01-05 DIAGNOSIS — E782 Mixed hyperlipidemia: Secondary | ICD-10-CM | POA: Diagnosis not present

## 2019-01-09 DIAGNOSIS — I10 Essential (primary) hypertension: Secondary | ICD-10-CM | POA: Diagnosis not present

## 2019-01-09 DIAGNOSIS — E1169 Type 2 diabetes mellitus with other specified complication: Secondary | ICD-10-CM | POA: Diagnosis not present

## 2019-01-09 DIAGNOSIS — Z0000 Encounter for general adult medical examination without abnormal findings: Secondary | ICD-10-CM | POA: Diagnosis not present

## 2019-01-09 DIAGNOSIS — E782 Mixed hyperlipidemia: Secondary | ICD-10-CM | POA: Diagnosis not present

## 2019-01-09 DIAGNOSIS — R945 Abnormal results of liver function studies: Secondary | ICD-10-CM | POA: Diagnosis not present

## 2019-01-09 DIAGNOSIS — I129 Hypertensive chronic kidney disease with stage 1 through stage 4 chronic kidney disease, or unspecified chronic kidney disease: Secondary | ICD-10-CM | POA: Diagnosis not present

## 2019-01-21 ENCOUNTER — Other Ambulatory Visit: Payer: Self-pay

## 2019-01-21 ENCOUNTER — Ambulatory Visit: Payer: BC Managed Care – PPO | Attending: Internal Medicine

## 2019-01-21 DIAGNOSIS — Z20828 Contact with and (suspected) exposure to other viral communicable diseases: Secondary | ICD-10-CM | POA: Diagnosis not present

## 2019-01-21 DIAGNOSIS — Z20822 Contact with and (suspected) exposure to covid-19: Secondary | ICD-10-CM

## 2019-01-22 LAB — NOVEL CORONAVIRUS, NAA: SARS-CoV-2, NAA: NOT DETECTED

## 2019-02-06 ENCOUNTER — Ambulatory Visit: Payer: 59 | Attending: Internal Medicine

## 2019-02-06 ENCOUNTER — Other Ambulatory Visit: Payer: Self-pay

## 2019-02-06 DIAGNOSIS — Z20822 Contact with and (suspected) exposure to covid-19: Secondary | ICD-10-CM | POA: Insufficient documentation

## 2019-02-07 LAB — NOVEL CORONAVIRUS, NAA: SARS-CoV-2, NAA: NOT DETECTED

## 2019-04-23 ENCOUNTER — Ambulatory Visit (INDEPENDENT_AMBULATORY_CARE_PROVIDER_SITE_OTHER): Payer: 59

## 2019-04-23 ENCOUNTER — Other Ambulatory Visit: Payer: Self-pay

## 2019-04-23 ENCOUNTER — Ambulatory Visit (INDEPENDENT_AMBULATORY_CARE_PROVIDER_SITE_OTHER): Payer: 59 | Admitting: Podiatry

## 2019-04-23 ENCOUNTER — Encounter: Payer: Self-pay | Admitting: Podiatry

## 2019-04-23 VITALS — BP 151/101 | HR 65 | Temp 97.3°F

## 2019-04-23 DIAGNOSIS — M778 Other enthesopathies, not elsewhere classified: Secondary | ICD-10-CM

## 2019-04-23 DIAGNOSIS — M7752 Other enthesopathy of left foot: Secondary | ICD-10-CM

## 2019-04-23 DIAGNOSIS — M7750 Other enthesopathy of unspecified foot: Secondary | ICD-10-CM

## 2019-04-23 DIAGNOSIS — M205X9 Other deformities of toe(s) (acquired), unspecified foot: Secondary | ICD-10-CM | POA: Diagnosis not present

## 2019-04-23 DIAGNOSIS — M1000 Idiopathic gout, unspecified site: Secondary | ICD-10-CM | POA: Diagnosis not present

## 2019-04-23 NOTE — Progress Notes (Signed)
Subjective:   Patient ID: Nicholas Turner, male   DOB: 49 y.o.   MRN: [id number]   HPI Patient presents stating is a lot of pain on top of his left foot and states his big toe joint right over left at times this for him is been diagnosed with gout and he takes colchicine as needed and ibuprofen.  He does state it seems to occur fairly frequently and he feels like he is lost his motion his big toe joint right but he states the top of the left foot is been going on a long time and bothersome.  Patient does not smoke is  obese and likes to be active   Review of Systems  All other systems reviewed and are negative.       Objective:  Physical Exam Vitals and nursing note reviewed.  Constitutional:      Appearance: He is well-developed.  Pulmonary:     Effort: Pulmonary effort is normal.  Musculoskeletal:        General: Normal range of motion.  Skin:    General: Skin is warm.  Neurological:     Mental Status: He is alert.     Neurovascular status intact muscle strength found to be within normal limits range of motion within normal limits.  There is quite a bit of swelling in the extensor complex and around the midtarsal joint left with inflammation fluid around this area and on the right there is noted to be significant range of motion loss first MPJ with spur formation dorsally with mild deformity of the left foot not to the same degree.  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  I then went ahead and I placed him in a fascial brace to lift up the arch and for the right I did discuss the hallux  limitus rigidus and I want to see him when he has a flareup so I can try to determine between the 2.  I did give him information today on gout and things to watch out for  X-rays indicate that there is significant spurring of the first MPJ right over left with narrowing and flattening of the joint surface and for the left I did note there is some indications of arthritis in the midtarsal joint

## 2019-04-23 NOTE — Patient Instructions (Signed)
 Gout  Gout is painful swelling of your joints. Gout is a type of arthritis. It is caused by having too much uric acid in your body. Uric acid is a chemical that is made when your body breaks down substances called purines. If your body has too much uric acid, sharp crystals can form and build up in your joints. This causes pain and swelling. Gout attacks can happen quickly and be very painful (acute gout). Over time, the attacks can affect more joints and happen more often (chronic gout). What are the causes?  Too much uric acid in your blood. This can happen because: ? Your kidneys do not remove enough uric acid from your blood. ? Your body makes too much uric acid. ? You eat too many foods that are high in purines. These foods include organ meats, some seafood, and beer.  Trauma or stress. What increases the risk?  Having a family history of gout.  Being male and middle-aged.  Being male and having gone through menopause.  Being very overweight (obese).  Drinking alcohol, especially beer.  Not having enough water in the body (being dehydrated).  Losing weight too quickly.  Having an organ transplant.  Having lead poisoning.  Taking certain medicines.  Having kidney disease.  Having a skin condition called psoriasis. What are the signs or symptoms? An attack of acute gout usually happens in just one joint. The most common place is the big toe. Attacks often start at night. Other joints that may be affected include joints of the feet, ankle, knee, fingers, wrist, or elbow. Symptoms of an attack may include:  Very bad pain.  Warmth.  Swelling.  Stiffness.  Shiny, red, or purple skin.  Tenderness. The affected joint may be very painful to touch.  Chills and fever. Chronic gout may cause symptoms more often. More joints may be involved. You may also have white or yellow lumps (tophi) on your hands or feet or in other areas near your joints. How is this  treated?  Treatment for this condition has two phases: treating an acute attack and preventing future attacks.  Acute gout treatment may include: ? NSAIDs. ? Steroids. These are taken by mouth or injected into a joint. ? Colchicine. This medicine relieves pain and swelling. It can be given by mouth or through an IV tube.  Preventive treatment may include: ? Taking small doses of NSAIDs or colchicine daily. ? Using a medicine that reduces uric acid levels in your blood. ? Making changes to your diet. You may need to see a food expert (dietitian) about what to eat and drink to prevent gout. Follow these instructions at home: During a gout attack   If told, put ice on the painful area: ? Put ice in a plastic bag. ? Place a towel between your skin and the bag. ? Leave the ice on for 20 minutes, 2-3 times a day.  Raise (elevate) the painful joint above the level of your heart as often as you can.  Rest the joint as much as possible. If the joint is in your leg, you may be given crutches.  Follow instructions from your doctor about what you cannot eat or drink. Avoiding future gout attacks  Eat a low-purine diet. Avoid foods and drinks such as: ? Liver. ? Kidney. ? Anchovies. ? Asparagus. ? Herring. ? Mushrooms. ? Mussels. ? Beer.  Stay at a healthy weight. If you want to lose weight, talk with your doctor. Do not lose   weight too fast.  Start or continue an exercise plan as told by your doctor. Eating and drinking  Drink enough fluids to keep your pee (urine) pale yellow.  If you drink alcohol: ? Limit how much you use to:  0-1 drink a day for women.  0-2 drinks a day for men. ? Be aware of how much alcohol is in your drink. In the [place] one drink equals one 12 oz bottle of beer (355 mL), one 5 oz glass of wine (148 mL), or one 1 oz glass of hard liquor (44 mL). General instructions  Take over-the-counter and prescription medicines only as told by your doctor.  Do  not drive or use heavy machinery while taking prescription pain medicine.  Return to your normal activities as told by your doctor. Ask your doctor what activities are safe for you.  Keep all follow-up visits as told by your doctor. This is important. Contact a doctor if:  You have another gout attack.  You still have symptoms of a gout attack after 10 days of treatment.  You have problems (side effects) because of your medicines.  You have chills or a fever.  You have burning pain when you pee (urinate).  You have pain in your lower back or belly. Get help right away if:  You have very bad pain.  Your pain cannot be controlled.  You cannot pee. Summary  Gout is painful swelling of the joints.  The most common site of pain is the big toe, but it can affect other joints.  Medicines and avoiding some foods can help to prevent and treat gout attacks. This information is not intended to replace advice given to you by your health care provider. Make sure you discuss any questions you have with your health care provider. Document Revised: 07/31/2017 Document Reviewed: 07/31/2017 Elsevier Patient Education  2020 [employer]  

## 2020-01-08 ENCOUNTER — Other Ambulatory Visit (HOSPITAL_COMMUNITY): Payer: Self-pay | Admitting: General Surgery

## 2020-01-08 ENCOUNTER — Other Ambulatory Visit: Payer: Self-pay | Admitting: General Surgery

## 2020-01-18 ENCOUNTER — Other Ambulatory Visit (HOSPITAL_COMMUNITY): Payer: Self-pay | Admitting: General Surgery

## 2020-01-18 ENCOUNTER — Other Ambulatory Visit: Payer: Self-pay

## 2020-01-18 ENCOUNTER — Ambulatory Visit (HOSPITAL_COMMUNITY)
Admission: RE | Admit: 2020-01-18 | Discharge: 2020-01-18 | Disposition: A | Discharge status: Home / Self Care | Payer: 59 | Source: Ambulatory Visit | Attending: General Surgery | Admitting: General Surgery

## 2020-01-18 DIAGNOSIS — R9431 Abnormal electrocardiogram [ECG] [EKG]: Secondary | ICD-10-CM | POA: Insufficient documentation

## 2020-01-28 ENCOUNTER — Ambulatory Visit: Payer: 59 | Admitting: Cardiovascular Disease

## 2020-01-31 NOTE — Progress Notes (Signed)
Cardiology Office Note:   Date:  02/04/2020  NAME:  Nicholas Turner    MRN: [id number] DOB:  [birth year]   PCP:  Celene Squibb, MD  Cardiologist:  No primary care provider on file.    Referring MD: Georganna Skeans, MD   Chief Complaint  Patient presents with  . Bariatric Pre-op   History of Present Illness:   Nicholas Turner is a 50 y.o. male with a hx of DM, HTN, obesity who is being seen today for the evaluation of preop assessment at the request of Georganna Skeans, MD.  He reports he will have bariatric surgery in the next few weeks.  Medical history significant for hypertension, diabetes, morbid obesity, hyperlipidemia.  His current weight is 311 pounds.  BMI 46.  No concerns for sleep apnea.  He reports he does have gout and is struggling with this.  He was quite active before this gout flare.  Apparently he can walk on a treadmill for 30 minutes without any chest pain, shortness of breath or palpitations.  He reports he can climb a flight of stairs without any limitations.  He did have an echocardiogram 2 years ago that showed changes of LVH.  His EKG in the office demonstrates normal sinus rhythm with LVH.  He has no chest pain, shortness of breath or palpitations reported.  His most recent lipid profile shows his LDL cholesterol is not at goal.  He is okay to increase his Lipitor.  He has never had a heart attack or stroke.  No CKD.  He is not on insulin.  He has never had congestive heart failure.  He does report a family history of heart disease.  He does not smoke, drink or use drugs.  He is currently unemployed.  Problem List 1. Obesity 2. DM -A1c 6.7 3. HTN 4. HLD - Total cholesterol 173, HDL 41, LDL 97, triglycerides 147  Past Medical History: Past Medical History:  Diagnosis Date  . Arthritis   . Chronic neck pain   . Diabetes mellitus   . Diverticulosis   . DM (diabetes mellitus) ([place]) 09/01/2010      . Fatty liver   . Gastroparesis   . Generalized headaches   .  Gout   . Gout 11/26/2011  . Hyperlipidemia   . Hypertension   . HYPERTENSION, UNCONTROLLED 09/09/2009   Qualifier: Diagnosis of  By: Via LPN, Jeani Hawking    . Kidney stones   . Knee pain    feet   . Obesity (BMI 30-39.9) 09/01/2010  . Palpitation   . SLEEP APNEA 09/09/2009   Qualifier: Diagnosis of  By: Via LPN, Jeani Hawking    . UTI (lower urinary tract infection)     Past Surgical History: Past Surgical History:  Procedure Laterality Date  . COLONOSCOPY N/A 05/22/2017   Procedure: COLONOSCOPY;  Surgeon: Daneil Dolin, MD;  Location: [facility];  Service: Endoscopy;  Laterality: N/A;  12:00  . COLONOSCOPY WITH ESOPHAGOGASTRODUODENOSCOPY (EGD) N/A 05/01/2012   [id number] esophagus. Small hiatal hernia. 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 pylori infection-status post biopsy (H.Pylori +). Minimally excoriated anal canal tissue. No hemorrhoids. Pancolonic diverticulosis. 8 mm polyp distal to the ICV, tubular adenoma. Next colonoscopy in April 2019.  Marland Kitchen ESOPHAGOGASTRODUODENOSCOPY ENDOSCOPY    . KIDNEY STONE SURGERY     post ureteral stent and status post lithotripsy (10-15 yrs Ago)    Current Medications: Current Meds  Medication Sig  . buPROPion (WELLBUTRIN XL) 150 MG  24 hr tablet Take 150 mg by mouth daily.  . furosemide (LASIX) 20 MG tablet Take 20 mg by mouth daily as needed for fluid.  Marland Kitchen ibuprofen (ADVIL) 800 MG tablet Take 800 mg by mouth 3 (three) times daily as needed.  . metFORMIN (GLUCOPHAGE) 500 MG tablet Take 500 mg by mouth 2 (two) times daily.  . metoprolol (LOPRESSOR) 100 MG tablet Take 1 tablet (100 mg total) by mouth 2 (two) times daily.  Marland Kitchen MITIGARE 0.6 MG CAPS Take 1 capsule by mouth daily.  . predniSONE (DELTASONE) 10 MG tablet TAKE 6 TABLETS ON DAY 1 THEN TAKE 5 TABLETS ON DAY 2 THEN TAKE 4 TABLETS ON DAY 3 THEN TAKE 3 TABLETS ON DAY 4 THEN TAKE 2 TABLETS ON DAY 5  . telmisartan-hydrochlorothiazide (MICARDIS HCT) 80-25 MG tablet Take 1 tablet by  mouth daily.   . [DISCONTINUED] atorvastatin (LIPITOR) 20 MG tablet Take 20 mg by mouth daily.     Allergies:    Clonidine derivatives and Oxycodone   Social History: Social History   Socioeconomic History  . Marital status: Married    Spouse name: Not on file  . Number of children: 3  . Years of education: Not on file  . Highest education level: Not on file  Occupational History  . Occupation: full time     Employer: [employer]    Comment: pest control  Tobacco Use  . Smoking status: Never Smoker  . Smokeless tobacco: Never Used  . Tobacco comment: Never smoker  Substance and Sexual Activity  . Alcohol use: No  . Drug use: No  . Sexual activity: Not on file  Other Topics Concern  . Not on file  Social History Narrative  . Not on file   Social Determinants of Health   Financial Resource Strain: Not on file  Food Insecurity: Not on file  Transportation Needs: Not on file  Physical Activity: Not on file  Stress: Not on file  Social Connections: Not on file     Family History: The patient's family history includes Alcohol abuse in an other family member; Arthritis in his mother and other family members; Asthma in an other family member; Breast cancer in his maternal aunt; Cancer in an other family member; Colon polyps in his mother and sister; Diabetes in an other family member; Heart attack in his father; Heart disease in some other family members; Hypertension in his mother and other family members; Lung cancer in his maternal uncle; Sudden death in an other family member. There is no history of Colon cancer.  ROS:   All other ROS reviewed and negative. Pertinent positives noted in the HPI.     EKGs/Labs/Other Studies Reviewed:   The following studies were personally reviewed by me today:  EKG:  EKG is ordered today.  The ekg ordered today demonstrates normal sinus rhythm heart rate 91, LVH by voltage, and was personally reviewed by me.   Recent Labs: No  results found for requested labs within last 8760 hours.   Recent Lipid Panel    Component Value Date/Time   CHOL 178 07/11/2012 1650   TRIG 86.0 07/11/2012 1650   HDL 47.00 07/11/2012 1650   CHOLHDL 4 07/11/2012 1650   VLDL 17.2 07/11/2012 1650   LDLCALC 114 (H) 07/11/2012 1650   LDLDIRECT 218.8 11/26/2011 1008    Physical Exam:   VS:  BP (!) 152/118   Pulse 91   Ht 5\' 9"  (1.753 m)   Wt Marland Kitchen)  311 lb (141.1 kg)   BMI 45.93 kg/m    Wt Readings from Last 3 Encounters:  02/04/20 (!) 311 lb (141.1 kg)  03/03/18 (!) 305 lb (138.3 kg)  05/22/17 288 lb (130.6 kg)    General: Well nourished, well developed, in no acute distress Head: Atraumatic, normal size  Eyes: PEERLA, EOMI  Neck: Supple, no JVD Endocrine: No thryomegaly Cardiac: Normal S1, S2; RRR; no murmurs, rubs, or gallops Lungs: Clear to auscultation bilaterally, no wheezing, rhonchi or rales  Abd: Soft, nontender, no hepatomegaly  Ext: No edema, pulses 2+ Musculoskeletal: No deformities, BUE and BLE strength normal and equal Skin: Warm and dry, no rashes   Neuro: Alert and oriented to person, place, time, and situation, CNII-XII grossly intact, no focal deficits  Psych: Normal mood and affect   ASSESSMENT:   KIRKLAND FIGG is a 50 y.o. male who presents for the following: 1. Preoperative cardiovascular examination   2. Obesity, morbid, BMI 40.0-49.9 ([place])   3. Primary hypertension   4. LVH (left ventricular hypertrophy)   5. Nonspecific abnormal electrocardiogram (ECG) (EKG)   6. Mixed hyperlipidemia     PLAN:   1. Preoperative cardiovascular examination 2. Obesity, morbid, BMI 40.0-49.9 ([facility]) -The Revised Cardiac Risk Index = 0 which equates to 0.4% (very low risk) estimated risk of perioperative myocardial infarction, pulmonary edema, ventricular fibrillation, cardiac arrest, or complete heart block.  -He can complete greater than 4 METS without any chest pain or shortness of breath.  He reports he was  exercising on a treadmill for 30 minutes.  I think his heart is healthy enough for surgery. -The above risk estimates do not account for his weight.  Clearly he will be high risk for pulmonary complications given his morbid obesity. -He does have changes of LVH on his EKG.  Blood pressure a bit elevated today but he reports he is having a gout flare.  I would like for him to repeat an echocardiogram just to have an updated one in the system.  Again, he may proceed to surgery.  This will just take place as he is still does not have a date. -Given that he can complete greater than 4 METS without any significant chest pain or shortness of breath he does not need a stress test. -Our service is available as needed in the peri-operative period.    3. Primary hypertension -BP a bit elevated.  Reports gout flare.  Likely related to that.  No change in medications.  4. LVH (left ventricular hypertrophy) 5. Nonspecific abnormal electrocardiogram (ECG) (EKG) -EKG shows changes of LVH.  I would like for him to repeat an echocardiogram.  This will not delay surgery.  6. Mixed hyperlipidemia -Most recent lipid profile shows LDL not at goal.  Given his diabetes LDL should be less than 70.  Increase Lipitor to 40 mg daily.  Disposition: Return if symptoms worsen or fail to improve.  Medication Adjustments/Labs and Tests Ordered: Current medicines are reviewed at length with the patient today.  Concerns regarding medicines are outlined above.  Orders Placed This Encounter  Procedures  . EKG 12-Lead  . ECHOCARDIOGRAM COMPLETE   Meds ordered this encounter  Medications  . atorvastatin (LIPITOR) 40 MG tablet    Sig: Take 1 tablet (40 mg total) by mouth daily.    Dispense:  90 tablet    Refill:  1    Patient Instructions  Medication Instructions:  Increase Lipitor to 40 mg daily   *If you need  a refill on your cardiac medications before your next appointment, please call your  pharmacy*  Testing/Procedures: Echocardiogram - Your physician has requested that you have an echocardiogram. Echocardiography is a painless test that uses sound waves to create images of your heart. It provides your doctor with information about the size and shape of your heart and how well your heart's chambers and valves are working. This procedure takes approximately one hour. There are no restrictions for this procedure. This will be performed at our [facility] location - [address].    Follow-Up: At [facility], you and your health needs are our priority.  As part of our continuing mission to provide you with exceptional heart care, we have created designated Provider Care Teams.  These Care Teams include your primary Cardiologist (physician) and Advanced Practice Providers (APPs -  Physician Assistants and Nurse Practitioners) who all work together to provide you with the care you need, when you need it.  We recommend signing up for the patient portal called "MyChart".  Sign up information is provided on this After Visit Summary.  MyChart is used to connect with patients for Virtual Visits (Telemedicine).  Patients are able to view lab/test results, encounter notes, upcoming appointments, etc.  Non-urgent messages can be sent to your provider as well.   To learn more about what you can do with MyChart, go to [email or web address].    Your next appointment:   As needed  The format for your next appointment:   In Person  Provider:   Eleonore Chiquito, MD        Signed, Addison Naegeli. Audie Box, [place]  [address] [place], [place] [address] [phone]  02/04/2020 10:03 AM

## 2020-02-04 ENCOUNTER — Encounter: Payer: Self-pay | Admitting: Cardiovascular Disease

## 2020-02-04 ENCOUNTER — Ambulatory Visit (INDEPENDENT_AMBULATORY_CARE_PROVIDER_SITE_OTHER): Payer: 59 | Admitting: Cardiovascular Disease

## 2020-02-04 ENCOUNTER — Other Ambulatory Visit: Payer: Self-pay

## 2020-02-04 VITALS — BP 152/118 | HR 91 | Ht 69.0 in | Wt 311.0 lb

## 2020-02-04 DIAGNOSIS — I517 Cardiomegaly: Secondary | ICD-10-CM

## 2020-02-04 DIAGNOSIS — E782 Mixed hyperlipidemia: Secondary | ICD-10-CM

## 2020-02-04 DIAGNOSIS — I10 Essential (primary) hypertension: Secondary | ICD-10-CM | POA: Diagnosis not present

## 2020-02-04 DIAGNOSIS — Z01810 Encounter for preprocedural cardiovascular examination: Secondary | ICD-10-CM

## 2020-02-04 DIAGNOSIS — R9431 Abnormal electrocardiogram [ECG] [EKG]: Secondary | ICD-10-CM

## 2020-02-04 MED ORDER — ATORVASTATIN CALCIUM 40 MG PO TABS: 40.0000 mg | ORAL_TABLET | Freq: Every day | ORAL | 1 refills | Status: DC

## 2020-02-04 NOTE — Patient Instructions (Addendum)
Medication Instructions:  Increase Lipitor to 40 mg daily   *If you need a refill on your cardiac medications before your next appointment, please call your pharmacy*  Testing/Procedures: Echocardiogram - Your physician has requested that you have an echocardiogram. Echocardiography is a painless test that uses sound waves to create images of your heart. It provides your doctor with information about the size and shape of your heart and how well your heart's chambers and valves are working. This procedure takes approximately one hour. There are no restrictions for this procedure. This will be performed at our [facility] location - [address], [address].    Follow-Up: At [facility], you and your health needs are our priority.  As part of our continuing mission to provide you with exceptional heart care, we have created designated Provider Care Teams.  These Care Teams include your primary Cardiologist (physician) and Advanced Practice Providers (APPs -  Physician Assistants and Nurse Practitioners) who all work together to provide you with the care you need, when you need it.  We recommend signing up for the patient portal called "MyChart".  Sign up information is provided on this After Visit Summary.  MyChart is used to connect with patients for Virtual Visits (Telemedicine).  Patients are able to view lab/test results, encounter notes, upcoming appointments, etc.  Non-urgent messages can be sent to your provider as well.   To learn more about what you can do with MyChart, go to [email or web address].    Your next appointment:   As needed  The format for your next appointment:   In Person  Provider:   Eleonore Chiquito, MD

## 2020-02-09 ENCOUNTER — Ambulatory Visit (HOSPITAL_COMMUNITY): Admission: RE | Admit: 2020-02-09 | Payer: 59 | Source: Ambulatory Visit

## 2020-02-10 ENCOUNTER — Ambulatory Visit: Payer: 59 | Admitting: Skilled Nursing Facility1

## 2020-02-18 ENCOUNTER — Ambulatory Visit (HOSPITAL_COMMUNITY)
Admission: RE | Admit: 2020-02-18 | Discharge: 2020-02-18 | Disposition: A | Discharge status: Home / Self Care | Payer: 59 | Source: Ambulatory Visit | Attending: Cardiovascular Disease | Admitting: Cardiovascular Disease

## 2020-02-18 ENCOUNTER — Other Ambulatory Visit: Payer: Self-pay

## 2020-02-18 DIAGNOSIS — I517 Cardiomegaly: Secondary | ICD-10-CM | POA: Diagnosis not present

## 2020-02-18 LAB — ECHOCARDIOGRAM COMPLETE
AR max vel: 2.53 cm2
AV Area VTI: 2.75 cm2
AV Area mean vel: 2.55 cm2
AV Mean grad: 3.2 mmHg
AV Peak grad: 6.2 mmHg
Ao pk vel: 1.24 m/s
Area-P 1/2: 3.43 cm2
S' Lateral: 2.3 cm

## 2020-02-18 NOTE — Progress Notes (Signed)
*  PRELIMINARY RESULTS* Echocardiogram 2D Echocardiogram has been performed.  Nicholas Turner 02/18/2020, 10:16 AM

## 2020-03-02 ENCOUNTER — Other Ambulatory Visit: Payer: Self-pay

## 2020-03-02 ENCOUNTER — Encounter: Payer: 59 | Attending: General Surgery | Admitting: Skilled Nursing Facility1

## 2020-03-02 ENCOUNTER — Encounter: Payer: Self-pay | Admitting: Skilled Nursing Facility1

## 2020-03-02 DIAGNOSIS — E119 Type 2 diabetes mellitus without complications: Secondary | ICD-10-CM | POA: Insufficient documentation

## 2020-03-02 DIAGNOSIS — E669 Obesity, unspecified: Secondary | ICD-10-CM | POA: Insufficient documentation

## 2020-03-02 NOTE — Progress Notes (Signed)
Nutrition Assessment for Bariatric Surgery Medical Nutrition Therapy  Patient was seen on 03/02/2020 for Pre-Operative Nutrition Assessment. Letter of approval faxed to [facility] bariatric surgery program coordinator on 03/02/2020  Referral stated Supervised Weight Loss (SWL) visits needed: 6  Planned surgery: Sleeve gastrectomy  Pt expectation of surgery: to lose weight Pt expectation of dietitian: to get me on the right path    NUTRITION ASSESSMENT   Anthropometrics  Start weight at [facility]: 303 lbs (date: 03/02/2020)  Height: 69 in BMI: 44.75 kg/m2     Clinical  Medical hx: diabetes, GERD, anxiety, depression, kidney stone, sleep apnea, diverticulosis, gastroparesis, fatty liver   Medications: metformin, lasix, long actin insulin (does not remember the name) Labs: A1C 10 (stating he was doing a couple rounds of steroids)  Notable signs/symptoms: headaches Any previous deficiencies? No  Micronutrient Nutrition Focused Physical Exam: Hair: No issues observed Eyes: No issues observed Mouth: No issues observed Neck: No issues observed Nails: No issues observed Skin: No issues observed  Lifestyle & Dietary Hx  Pt arrives with the use a cane due to a current gouty flare up. Pt states he was recently told he has kidney disease unsure of staging. Pt states due to spurs, tendonitis, and gout he has not been as active as he likes.  Pt states he checks his blood sugar fasting every morning: around 173 stating it is lower than it has been.  Pt states he will eat 2-3 apples at once.  Pt states he is sensitive to caffeine so he avoids it. Pt states he struggles with doing too muc activity and then taking a while to recover.   24-Hr Dietary Recall First Meal: oatmeal Snack: cheese and nuts Second Meal: salad: spring mix, cucumber  or eggs + bacon  Snack: granola bar Third Meal: baked chicken + broccoli Snack: sugar free jello + whip cream Beverages: diet tea, water,  low sugar cranberry juice, coffee + sugar free creamer   Estimated Energy Needs Calories: 1800   NUTRITION DIAGNOSIS  Overweight/obesity (-3.3) related to past poor dietary habits and physical inactivity as evidenced by patient w/ planned sleeve gastrectomy surgery following dietary guidelines for continued weight loss.    NUTRITION INTERVENTION  Nutrition counseling (C-1) and education (E-2) to facilitate bariatric surgery goals.   Pre-Op Goals Reviewed with the Patient . Track food and beverage intake (pen and paper, MyFitness Pal, Baritastic app, etc.) . Make healthy food choices while monitoring portion sizes . Consume 3 meals per day or try to eat every 3-5 hours . Avoid concentrated sugars and fried foods . Keep sugar & fat in the single digits per serving on food labels . Practice CHEWING your food (aim for applesauce consistency) . Practice not drinking 15 minutes before, during, and 30 minutes after each meal and snack . Avoid all carbonated beverages (ex: soda, sparkling beverages)  . Limit caffeinated beverages (ex: coffee, tea, energy drinks) . Avoid all sugar-sweetened beverages (ex: regular soda, sports drinks)  . Avoid alcohol  . Aim for 64-100 ounces of FLUID daily (with at least half of fluid intake being plain water)  . Aim for at least 60-80 grams of PROTEIN daily . Look for a liquid protein source that contains ?15 g protein and ?5 g carbohydrate (ex: shakes, drinks, shots) . Make a list of non-food related activities . Physical activity is an important part of a healthy lifestyle so keep it moving! The goal is to reach 150 minutes of exercise per week, including  cardiovascular and weight baring activity.  *Goals that are bolded indicate the pt would like to start working towards these  Handouts Provided Include  . Bariatric Surgery handouts (Nutrition Visits, Pre-Op Goals, Protein Shakes, Vitamins & Minerals)  Learning Style & Readiness for Change Teaching  method utilized: Visual & Auditory  Demonstrated degree of understanding via: Teach Back  Readiness Level: contemplative  Barriers to learning/adherence to lifestyle change: none identified   RD's Notes for Next Visit . Assess pts adherence to chosen goals     MONITORING & EVALUATION Dietary intake, weekly physical activity, body weight, and pre-op goals reached at next nutrition visit.    Next Steps  Patient is to follow up at [facility] for Pre-Op Class >2 weeks before surgery for further nutrition education.

## 2020-03-03 ENCOUNTER — Telehealth: Payer: Self-pay | Admitting: Skilled Nursing Facility1

## 2020-03-03 NOTE — Telephone Encounter (Signed)
Nicholas Turner  Sammy Douthitt, Francesco Sor, RD Mela Perham,]  EW is out of town til Tuesday  I checked the info we had in our [employer] chart and the referral from his PCP  Most places it says that his CKD is "unspecified"  In the note from his PCP is has "stege 2-mild"  Hope this helps.   Diane        Previous Messages   ----- Message -----  From: Ruby Cola, RD  Sent: 03/02/2020 11:36 AM EST  To: Greer Pickerel, MD   Good Morning,   I hope this message finds you well.   Just wanted to ask if you knew anything about Mr. Keedan having kidney disease. He said when he went in for his cardiac work up they discovered kidney disease, I could not find anything in his chart but wondered if maybe you knew about it? We know he has stones history but wondering if he has staged disease.     Thanks,  [employer]

## 2020-04-04 ENCOUNTER — Encounter: Payer: 59 | Attending: General Surgery | Admitting: Skilled Nursing Facility1

## 2020-04-04 ENCOUNTER — Other Ambulatory Visit: Payer: Self-pay

## 2020-04-04 DIAGNOSIS — E669 Obesity, unspecified: Secondary | ICD-10-CM | POA: Diagnosis present

## 2020-04-04 DIAGNOSIS — E119 Type 2 diabetes mellitus without complications: Secondary | ICD-10-CM | POA: Insufficient documentation

## 2020-04-04 NOTE — Progress Notes (Signed)
Supervised Weight Loss Visit Bariatric Nutrition Education  Planned Surgery: sleeve  1 out of 6 SWL Appointments    NUTRITION ASSESSMENT  Anthropometrics  Start weight at [facility]: 303 lbs (date: 03/02/2020)  Weight: 309 pounds Height: 69 in BMI: 45.72 kg/m2     Clinical  Medical hx: diabetes, GERD, anxiety, depression, kidney stone, sleep apnea, diverticulosis, gastroparesis, fatty liver, stage 2 kidney disease Medications: metformin, lasix, long actin insulin (does not remember the name) Labs: A1C 10 (stating he was doing a couple rounds of steroids)  Notable signs/symptoms: headaches, extreme pain in his feet Any previous deficiencies? No Lifestyle & Dietary Hx  Pt states he checks his fastings daily around 140's.  Pt states he has been working on not drinking with meals and chewing well. Pt states he wants to increase his physical activity now that his gouty flare up has decreased wants to start walking again.    Pt states he knows what to do and how to lose weight but then when he gest excruciating pain in his feet he is laid up and that has always thrown him off track causing weight gain.    Pt arrives with his wife.   Estimated daily fluid intake: oz Supplements:  Current average weekly physical activity: ADL's  24-Hr Dietary Recall First Meal: yogurt and oatmeal Snack:  Second Meal: salad or veggie sub Snack: almonds Third Meal: chicken and broccoli and potatoes  Snack:  Beverages: water, diet tea  Estimated Energy Needs Calories: 1800  NUTRITION DIAGNOSIS  Overweight/obesity ([place]-3.3) related to past poor dietary habits and physical inactivity as evidenced by patient w/ planned sleeve surgery following dietary guidelines for continued weight loss.   NUTRITION INTERVENTION  Nutrition counseling (C-1) and education (E-2) to facilitate bariatric surgery goals.  Pre-Op Goals Progress & New Goals Continue: Track food and beverage intake (pen and paper, MyFitness  Pal, Baritastic app, etc.) Continue: Practice CHEWING your food (aim for applesauce consistency) Continue: not drinking with meals Continue: decreasing portion sizes  NEW: find an activity to occupy yourself during flare ups things that will keep you from increasing your calories when being sedentary   Handouts Provided Include   N/A  Learning Style & Readiness for Change Teaching method utilized: Visual & Auditory  Demonstrated degree of understanding via: Teach Back  Readiness Level: action Barriers to learning/adherence to lifestyle change: painful feet flare ups  RD's Notes for next Visit  . Assess pts adherence to chosen goals    MONITORING & EVALUATION Dietary intake, weekly physical activity, body weight, and pre-op goals in 1 month.   Next Steps  Patient is to return to [facility] for SWL

## 2020-04-05 DIAGNOSIS — M79676 Pain in unspecified toe(s): Secondary | ICD-10-CM

## 2020-05-04 ENCOUNTER — Other Ambulatory Visit: Payer: Self-pay

## 2020-05-04 ENCOUNTER — Encounter: Payer: 59 | Attending: General Surgery | Admitting: Skilled Nursing Facility1

## 2020-05-04 DIAGNOSIS — E669 Obesity, unspecified: Secondary | ICD-10-CM | POA: Insufficient documentation

## 2020-05-04 DIAGNOSIS — E119 Type 2 diabetes mellitus without complications: Secondary | ICD-10-CM | POA: Insufficient documentation

## 2020-05-04 NOTE — Progress Notes (Addendum)
Supervised Weight Loss Visit Bariatric Nutrition Education  Planned Surgery: sleeve  2 out of 6 SWL Appointments    NUTRITION ASSESSMENT  Anthropometrics  Start weight at [facility]: 303 lbs (date: 03/02/2020)  Weight: 313 lb  (pt states he's been retaining fluid as he's taking steroids due to gouty flare up) Height: 69 in BMI: 46.22 kg/m2     Clinical  Medical hx: diabetes, GERD, anxiety, depression, kidney stone, sleep apnea, diverticulosis, gastroparesis, fatty liver, stage 2 kidney disease   Medications: metformin, lasix, long actin insulin (does not remember the name)  Allopurinol 05/04/20 and cotrazine, steroid x 12 days (on day 11).  Metformin making him feel sick to stomach, reduces his appetite. Labs: A1C 10 (stating he was doing a couple rounds of steroids)  Notable signs/symptoms: headaches, extreme pain in his feet Any previous deficiencies? No Lifestyle & Dietary Hx  Pt states he checks his fastings daily.  Were in 110 - 117 mg/dL range until steroid started for gout flareup and now are 170mg /dL.  Pt states he has been working on not drinking with meals and chewing well. Pt states he wants to increase his physical activity now that his gouty flare up has decreased wants to start walking again.    Pt arrives with his wife.   Has felt "blue" nearly every day since gout flare up 4 months ago.  Medication is helping.  RD counseled that after surgery there could be another gout flare.  Wife and daughter also have diabetes and so everyone in the household is trying to eat similar/healthy.  Trying not to eat after 8:30pm  Eating more grilled meats rather than fried  Weighs and measures portions to match the label's serving size, especially of foods high in carb/fat, and weighs meat after it's cooked  Pt has been painting landscapes as an activity when gout limits his physical activity.  Uses painting apps and his tablet.  Estimated daily fluid intake: oz Supplements:   Current average weekly physical activity: ADL's  24-Hr Dietary Recall First Meal: yogurt and oatmeal (2 boiled eggs + 1 pc avocado toast with 1/4 avocado w/ s/p.  If he eats yogurt, it's greek) Snack: apple (4 - 6 per day, snacks on the slices throughout the day) Second Meal: salad or veggie sub (grilled chicken salad with cukes, tomato, onion, spring mix Snack: almonds Third Meal: grilled chicken (or other grilled meat) and broccoli and potatoes (or peas, limas, or corn) Snack: 2-3 pears, sliced and eaten throughout the day, also strawberries and blueberries throughout the day.  Yogurt as snack before bed.   Beverages: water (adds lemon), diet green tea  Estimated Energy Needs Calories: 1800  NUTRITION DIAGNOSIS  Overweight/obesity (-3.3) related to past poor dietary habits and physical inactivity as evidenced by patient w/ planned sleeve surgery following dietary guidelines for continued weight loss.   NUTRITION INTERVENTION  Nutrition counseling (C-1) and education (E-2) to facilitate bariatric surgery goals.  RD counseled that:  pt may see reduction in A1c related to reduction in fruit intake Tips to reduce saturated fat and why Principles of balance and moderation Answered his questions on food intake post surgery (if he will get enough food in light of reduced appetite related to medication side effects (he reports metformin reduces his appetite).  RD addressed and answered this.  Pre-Op Goals Progress & New Goals Continue: Track food and beverage intake (pen and paper, MyFitness Pal, Baritastic app, etc.) Continue: Practice CHEWING your food (aim for applesauce consistency) Continue:  not drinking with meals Continue: decreasing portion sizes  continue: find an activity to occupy yourself during flare ups things that will keep you from increasing your calories when being sedentary  NEW: Reduce apple intake to 2 per day NEW: Reduce pear intake to 1 per day  Handouts  Provided Include   N/A  Learning Style & Readiness for Change Teaching method utilized: Visual & Auditory  Demonstrated degree of understanding via: Teach Back  Readiness Level: action Barriers to learning/adherence to lifestyle change: painful gout flare ups  RD's Notes for next Visit  . Assess pts adherence to chosen goals    MONITORING & EVALUATION Dietary intake, weekly physical activity, body weight, and pre-op goals in 1 month.   Next Steps  Patient is to return to [facility] for SWL

## 2020-05-19 ENCOUNTER — Emergency Department (HOSPITAL_COMMUNITY)
Admission: EM | Admit: 2020-05-19 | Discharge: 2020-05-19 | Disposition: A | Discharge status: Home / Self Care | Payer: 59 | Attending: Emergency Medicine | Admitting: Emergency Medicine

## 2020-05-19 ENCOUNTER — Encounter (HOSPITAL_COMMUNITY): Payer: Self-pay | Admitting: *Deleted

## 2020-05-19 ENCOUNTER — Emergency Department (HOSPITAL_COMMUNITY): Payer: 59

## 2020-05-19 ENCOUNTER — Other Ambulatory Visit: Payer: Self-pay

## 2020-05-19 DIAGNOSIS — M79671 Pain in right foot: Secondary | ICD-10-CM | POA: Diagnosis present

## 2020-05-19 DIAGNOSIS — I10 Essential (primary) hypertension: Secondary | ICD-10-CM | POA: Insufficient documentation

## 2020-05-19 DIAGNOSIS — M1A0720 Idiopathic chronic gout, left ankle and foot, without tophus (tophi): Secondary | ICD-10-CM | POA: Diagnosis not present

## 2020-05-19 DIAGNOSIS — Z7984 Long term (current) use of oral hypoglycemic drugs: Secondary | ICD-10-CM | POA: Insufficient documentation

## 2020-05-19 DIAGNOSIS — Z79899 Other long term (current) drug therapy: Secondary | ICD-10-CM | POA: Diagnosis not present

## 2020-05-19 DIAGNOSIS — M1A0710 Idiopathic chronic gout, right ankle and foot, without tophus (tophi): Secondary | ICD-10-CM | POA: Diagnosis not present

## 2020-05-19 DIAGNOSIS — E1143 Type 2 diabetes mellitus with diabetic autonomic (poly)neuropathy: Secondary | ICD-10-CM | POA: Insufficient documentation

## 2020-05-19 MED ORDER — IBUPROFEN 400 MG PO TABS
600.0000 mg | ORAL_TABLET | Freq: Once | ORAL | Status: AC
  Administered 2020-05-19: 600 mg via ORAL
  Filled 2020-05-19: qty 2

## 2020-05-19 NOTE — ED Provider Notes (Signed)
Nicholas Turner Provider Note   CSN: [id number] Arrival date & time: 05/19/20  1245     History Chief Complaint  Patient presents with  . Foot Pain    Nicholas Turner is a 50 y.o. male with a past medical history of DM, hypertension, chronic gout, hypertension, hyperlipidemia, obesity, BMI over 69 who presents today for evaluation of concern for gout flare.  He was seen by rheumatology 3 days ago on Monday and they gave him a steroid shot per his report. He states that he had a PO steroid course a few weeks ago.   Per chart review he had a 100 mg dose of Depo-Medrol on the 25th at his rheumatologist's.  He states that the only thing that he feels has helped his pain before is 800 mg of ibuprofen. He also reports that he has had worsening pain and swelling in the right leg.  He states that his gout never fully goes away.  He also notes that he is worsening varicose veins in his right leg currently.  He reports compliance with his BP meds.   Aside from the increased swelling in the right lower leg he says this feels like his usual gout, last flair this bad was one year ago.  HPI     Past Medical History:  Diagnosis Date  . Arthritis   . Chronic neck pain   . Diabetes mellitus   . Diverticulosis   . DM (diabetes mellitus) ([place]) 09/01/2010      . Fatty liver   . Gastroparesis   . Generalized headaches   . Gout   . Gout 11/26/2011  . Hyperlipidemia   . Hypertension   . HYPERTENSION, UNCONTROLLED 09/09/2009   Qualifier: Diagnosis of  By: Via LPN, Jeani Hawking    . Kidney stones   . Knee pain    feet   . Obesity (BMI 30-39.9) 09/01/2010  . Palpitation   . SLEEP APNEA 09/09/2009   Qualifier: Diagnosis of  By: Via LPN, Jeani Hawking    . UTI (lower urinary tract infection)     Patient Active Problem List   Diagnosis Date Noted  . Personal history of colonic polyps 06/12/2012  . Family history of colonic polyps 04/21/2012  . Hematochezia 04/10/2012  . Abnormal liver  function 04/10/2012  . Gout 11/26/2011  . Preventative health care 11/26/2011  . Right knee pain 11/26/2011  . Right ankle pain 11/26/2011  . Arthritis   . Hyperlipidemia   . Kidney stones   . Nausea alone 04/16/2011  . Obesity (BMI 30-39.9) 09/01/2010  . DM (diabetes mellitus) ([place]) 09/01/2010  . HYPERTENSION, UNCONTROLLED 09/09/2009  . SLEEP APNEA 09/09/2009    Past Surgical History:  Procedure Laterality Date  . COLONOSCOPY N/A 05/22/2017   Procedure: COLONOSCOPY;  Surgeon: Daneil Dolin, MD;  Location: [facility];  Service: Endoscopy;  Laterality: N/A;  12:00  . COLONOSCOPY WITH ESOPHAGOGASTRODUODENOSCOPY (EGD) N/A 05/01/2012   WUJ:WJXBJY esophagus. Small hiatal hernia. 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 pylori infection-status post biopsy (H.Pylori +). Minimally excoriated anal canal tissue. No hemorrhoids. Pancolonic diverticulosis. 8 mm polyp distal to the ICV, tubular adenoma. Next colonoscopy in April 2019.  Marland Kitchen ESOPHAGOGASTRODUODENOSCOPY ENDOSCOPY    . KIDNEY STONE SURGERY     post ureteral stent and status post lithotripsy (10-15 yrs Ago)       Family History  Problem Relation Age of Onset  . Arthritis Mother   . Colon polyps Mother   .  Hypertension Mother   . Heart attack Father   . Arthritis Other   . Heart disease Other   . Hypertension Other   . Sudden death Other   . Arthritis Other   . Heart disease Other   . Hypertension Other   . Alcohol abuse Other   . Cancer Other   . Asthma Other        breast cancer  . Heart disease Other   . Hypertension Other   . Diabetes Other   . Lung cancer Maternal Uncle   . Breast cancer Maternal Aunt   . Colon polyps Sister        age 79  . Colon cancer Neg Hx     Social History   Tobacco Use  . Smoking status: Never Smoker  . Smokeless tobacco: Never Used  . Tobacco comment: Never smoker  Substance Use Topics  . Alcohol use: No  . Drug use: No    Home Medications Prior  to Admission medications   Medication Sig Start Date End Date Taking? Authorizing Provider  allopurinol (ZYLOPRIM) 100 MG tablet Take 100-200 mg by mouth daily. 05/03/20  Yes [provider]  atorvastatin (LIPITOR) 40 MG tablet Take 1 tablet (40 mg total) by mouth daily. 02/04/20  Yes O'Neal, Cassie Freer, MD  buPROPion (WELLBUTRIN XL) 150 MG 24 hr tablet Take 150 mg by mouth daily.   Yes [provider]  furosemide (LASIX) 20 MG tablet Take 20 mg by mouth daily as needed for fluid.   Yes [provider]  metFORMIN (GLUCOPHAGE) 500 MG tablet Take 500 mg by mouth 2 (two) times daily. 02/05/19  Yes [provider]  metoprolol (LOPRESSOR) 100 MG tablet Take 1 tablet (100 mg total) by mouth 2 (two) times daily. 10/20/12  Yes Satira Sark, MD  MITIGARE 0.6 MG CAPS Take 1 capsule by mouth daily. 04/21/19  Yes [provider]  [place] 100-33 UNT-MCG/ML SOPN Inject 40 Units into the skin at bedtime. 04/08/20  Yes [provider]  telmisartan (MICARDIS) 80 MG tablet Take 80 mg by mouth daily. 03/30/20  Yes [provider]  predniSONE (DELTASONE) 10 MG tablet TAKE 6 TABLETS ON DAY 1 THEN TAKE 5 TABLETS ON DAY 2 THEN TAKE 4 TABLETS ON DAY 3 THEN TAKE 3 TABLETS ON DAY 4 THEN TAKE 2 TABLETS ON DAY 5 Patient not taking: No sig reported 01/26/19   [provider]    Allergies    Clonidine derivatives and Oxycodone  Review of Systems   Review of Systems  Constitutional: Negative for fever.  Respiratory: Negative for cough and shortness of breath.   Cardiovascular: Negative for chest pain.  Gastrointestinal: Negative for abdominal pain, nausea and vomiting.  Musculoskeletal: Negative for back pain.       Bilateral foot pain  Neurological: Negative for weakness and headaches.  All other systems reviewed and are negative.   Physical Exam Updated Vital Signs BP (!) 170/102 (BP Location: Right Arm)   Pulse 83   Temp 98.6 F (37 C)  (Oral)   Resp 16   Ht 5\' 9"  (1.753 m)   Wt (!) 142.4 kg   SpO2 99%   BMI 46.37 kg/m   Physical Exam Vitals and nursing note reviewed.  Constitutional:      General: He is not in acute distress.    Appearance: He is not diaphoretic.  HENT:     Head: Normocephalic and atraumatic.  Eyes:     General:  No scleral icterus.       Right eye: No discharge.        Left eye: No discharge.     Conjunctiva/sclera: Conjunctivae normal.  Cardiovascular:     Rate and Rhythm: Normal rate and regular rhythm.  Pulmonary:     Effort: Pulmonary effort is normal. No respiratory distress.     Breath sounds: No stridor.  Abdominal:     General: There is no distension.  Musculoskeletal:        General: No deformity.     Cervical back: Normal range of motion.     Comments: There is edema of the right lower leg when compared to the left primarily in the calf.  Additionally there are what appears to be firm varicose veins versus tophi in the left lower leg with tenderness over the left calf.  Patient is able to move his ankles and toes however reports pain with doing so.  No obvious crepitus or deformities noted.  Skin:    General: Skin is warm and dry.     Comments: No abnormal erythema, induration, contusion or wound visualized on bilateral feet/ankles.  Neurological:     Mental Status: He is alert.     Motor: No abnormal muscle tone.     Comments: Patient is awake and alert, answers questions appropriately.  Speech is not slurred.  Psychiatric:        Mood and Affect: Mood normal.        Behavior: Behavior normal.     ED Results / Procedures / Treatments   Labs (all labs ordered are listed, but only abnormal results are displayed) Labs Reviewed - No data to display  EKG None  Radiology US Venous Img Lower Right (DVT Study)  Result Date: 05/19/2020 CLINICAL DATA:  Right lower extremity pain and edema. EXAM: Right LOWER EXTREMITY VENOUS DOPPLER ULTRASOUND TECHNIQUE: Gray-scale sonography  with compression, as well as color and duplex ultrasound, were performed to evaluate the deep venous system(s) from the level of the common femoral vein through the popliteal and proximal calf veins. COMPARISON:  Ultrasound May 14, 2009. FINDINGS: VENOUS Normal compressibility of the common femoral, superficial femoral, and popliteal veins, as well as the visualized calf veins. Visualized portions of profunda femoral vein and great saphenous vein unremarkable. No filling defects to suggest DVT on grayscale or color Doppler imaging. Doppler waveforms show normal direction of venous flow, normal respiratory plasticity and response to augmentation. Limited views of the contralateral common femoral vein are unremarkable. OTHER None. Limitations: none IMPRESSION: Negative. Electronically Signed   By: Dahlia Bailiff MD   On: 05/19/2020 15:36    Procedures Procedures   Medications Ordered in ED Medications  ibuprofen (ADVIL) tablet 600 mg (600 mg Oral Given 05/19/20 1614)    ED Course  I have reviewed the triage vital signs and the nursing notes.  Pertinent labs & imaging results that were available during my care of the patient were reviewed by me and considered in my medical decision making (see chart for details).    MDM Rules/Calculators/A&P                         Patient is a 50 year old man who presents today for evaluation of ongoing pain in his bilateral feet.  Longstanding history of gout and was seen by his rheumatologist on Monday and given Depo-Medrol shot however he continues to have pain.  His right leg has also had increased swelling.  On exam right leg is more swollen than the left and there are visible varicose veins.  Given the obvious difference in leg size, combined with decreased mobility due to gout flare DVT ultrasound is obtained without evidence of DVT. Given that patient states this is consistent with his usual gout flare I recommended rheumatology follow-up.  He does not  tolerate p.o. narcotics for pain therefore these are not prescribed.  He requested ibuprofen which he was given.  We discussed risks of NSAIDs and to use them sparingly and as needed and he states his understanding. Doubt septic arthritis, he is afebrile and not tachycardic and is able to bear weight.  He recently had PO steroids and I do not think that additional steroids at this time (especially with the depo medrol shot he got) would be helpful given the risks.   No trauma that would indicate need for x-rays.  Patient was hypertensive while in the ER, I suspect that this is secondary to pain, we discussed the importance of PCP follow-up for recheck.  He states his understanding.  Return precautions were discussed with patient who states their understanding.  At the time of discharge patient denied any unaddressed complaints or concerns.  Patient is agreeable for discharge home.  Note: Portions of this report may have been transcribed using voice recognition software. Every effort was made to ensure accuracy; however, inadvertent computerized transcription errors may be present   Final Clinical Impression(s) / ED Diagnoses Final diagnoses:  Chronic gout of right foot, unspecified cause  Chronic gout of left foot, unspecified cause  Hypertension, unspecified type    Rx / DC Orders ED Discharge Orders    None       Ollen Gross 05/19/20 Shawnee Knapp, MD 05/21/20 [phone]

## 2020-05-19 NOTE — ED Notes (Signed)
US in progress

## 2020-05-19 NOTE — Discharge Instructions (Signed)
Please follow up with your primary care doctor and your rheumatologist.  If you develop fevers, worsening symptoms or have other concerns please seek additional medical care and evaluation.  While in the emergency room your blood pressure is elevated, this is most likely due to pain however please get this rechecked in the next 2 weeks.

## 2020-05-19 NOTE — ED Triage Notes (Signed)
Bilateral foot pain, history of gout ?

## 2020-06-01 ENCOUNTER — Encounter: Payer: 59 | Attending: General Surgery | Admitting: Skilled Nursing Facility1

## 2020-06-01 ENCOUNTER — Other Ambulatory Visit: Payer: Self-pay

## 2020-06-01 DIAGNOSIS — E669 Obesity, unspecified: Secondary | ICD-10-CM | POA: Insufficient documentation

## 2020-06-01 DIAGNOSIS — E119 Type 2 diabetes mellitus without complications: Secondary | ICD-10-CM | POA: Insufficient documentation

## 2020-06-01 NOTE — Progress Notes (Addendum)
Supervised Weight Loss Visit Bariatric Nutrition Education  Planned Surgery: sleeve  3 out of 6 SWL Appointments    NUTRITION ASSESSMENT  Anthropometrics  Start weight at [facility]: 303 lbs (date: 03/02/2020)  Weight: unable due to weigh due to pain upon standing Height: 69 in BMI:  kg/m2     Clinical  Medical hx: diabetes, GERD, anxiety, depression, kidney stone, sleep apnea, diverticulosis, gastroparesis, fatty liver, stage 2 kidney disease   Medications: metformin, lasix, long actin insulin (does not remember the name)  Allopurinol 05/04/20 and cotrazine, steroid x 12 days (on day 11).  Metformin making him feel sick to stomach, reduces his appetite. Labs: A1C 10 (stating he was doing a couple rounds of steroids)  Notable signs/symptoms: headaches, extreme pain in his feet Any previous deficiencies? No Lifestyle & Dietary Hx  Pt states he checks his fastings daily.  Were in 110 - 117 mg/dL range until steroid started for gout flareup and now are 170mg /dL. Pt has been painting landscapes as an activity when gout limits his physical activity.  Uses painting apps and his tablet.   Pt states his physcians have told him they are thinking his pain he is experiencing may not actually be gout. Pt states he deals with the pain and stress by sleeping mostly. Pt states his blood pressure has been running up due to the pain he is in. Pt states his children and wife make his meals for him now due to pain upon standing.  Pt sates he was able to cut back on his fruit serving down to one serving per day.  Estimated daily fluid intake: oz Supplements:  Current average weekly physical activity: ADL's  24-Hr Dietary Recall First Meal: yogurt and oatmeal (2 boiled eggs + 1 pc avocado toast with 1/4 avocado w/ s/p.  If he eats yogurt, it's greek) Snack: apple (4 - 6 per day, snacks on the slices throughout the day) Second Meal: salad or veggie sub (grilled chicken salad with cukes, tomato, onion,  spring mix Snack: almonds Third Meal: grilled chicken (or other grilled meat) and broccoli and potatoes (or peas, limas, or corn) Snack: 2-3 pears, sliced and eaten throughout the day, also strawberries and blueberries throughout the day.  Yogurt as snack before bed.   Beverages: water (adds lemon), diet green tea  Estimated Energy Needs Calories: 1800  NUTRITION DIAGNOSIS  Overweight/obesity ([place]-3.3) related to past poor dietary habits and physical inactivity as evidenced by patient w/ planned sleeve surgery following dietary guidelines for continued weight loss.   NUTRITION INTERVENTION  Nutrition counseling (C-1) and education (E-2) to facilitate bariatric surgery goals.  RD counseled that:  pt may see reduction in A1c related to reduction in fruit intake Tips to reduce saturated fat and why Principles of balance and moderation Answered his questions on food intake post surgery (if he will get enough food in light of reduced appetite related to medication side effects (he reports metformin reduces his appetite).  RD addressed and answered this.  Pre-Op Goals Progress & New Goals Continue: Track food and beverage intake (pen and paper, MyFitness Pal, Baritastic app, etc.) Continue: Practice CHEWING your food (aim for applesauce consistency) Continue: not drinking with meals Continue: decreasing portion sizes  continue: find an activity to occupy yourself during flare ups things that will keep you from increasing your calories when being sedentary  continue: Reduce apple intake to 2 per day continue: Reduce pear intake to 1 per day NEW: independence by using a chair to make  your meals NEW: increase activity by doing leg lifts, side bends, resistance bands   Handouts Provided Include   N/A  Learning Style & Readiness for Change Teaching method utilized: Visual & Auditory  Demonstrated degree of understanding via: Teach Back  Readiness Level: action Barriers to  learning/adherence to lifestyle change: painful gout flare ups  RD's Notes for next Visit  . Assess pts adherence to chosen goals    MONITORING & EVALUATION Dietary intake, weekly physical activity, body weight, and pre-op goals in 1 month.   Next Steps  Patient is to return to [facility] for SWL

## 2020-06-22 ENCOUNTER — Emergency Department (HOSPITAL_COMMUNITY)
Admission: EM | Admit: 2020-06-22 | Discharge: 2020-06-22 | Disposition: A | Discharge status: Home / Self Care | Payer: 59 | Attending: Emergency Medicine | Admitting: Emergency Medicine

## 2020-06-22 ENCOUNTER — Encounter (HOSPITAL_COMMUNITY): Payer: Self-pay | Admitting: Emergency Medicine

## 2020-06-22 ENCOUNTER — Other Ambulatory Visit: Payer: Self-pay

## 2020-06-22 DIAGNOSIS — M25572 Pain in left ankle and joints of left foot: Secondary | ICD-10-CM | POA: Diagnosis not present

## 2020-06-22 DIAGNOSIS — M25562 Pain in left knee: Secondary | ICD-10-CM | POA: Diagnosis not present

## 2020-06-22 DIAGNOSIS — M79675 Pain in left toe(s): Secondary | ICD-10-CM | POA: Insufficient documentation

## 2020-06-22 DIAGNOSIS — Z79899 Other long term (current) drug therapy: Secondary | ICD-10-CM | POA: Diagnosis not present

## 2020-06-22 DIAGNOSIS — E119 Type 2 diabetes mellitus without complications: Secondary | ICD-10-CM | POA: Diagnosis not present

## 2020-06-22 DIAGNOSIS — Z20822 Contact with and (suspected) exposure to covid-19: Secondary | ICD-10-CM | POA: Insufficient documentation

## 2020-06-22 DIAGNOSIS — M25522 Pain in left elbow: Secondary | ICD-10-CM | POA: Diagnosis present

## 2020-06-22 DIAGNOSIS — M2550 Pain in unspecified joint: Secondary | ICD-10-CM

## 2020-06-22 DIAGNOSIS — I10 Essential (primary) hypertension: Secondary | ICD-10-CM | POA: Insufficient documentation

## 2020-06-22 DIAGNOSIS — Z7984 Long term (current) use of oral hypoglycemic drugs: Secondary | ICD-10-CM | POA: Insufficient documentation

## 2020-06-22 LAB — RESP PANEL BY RT-PCR (FLU A&B, COVID) ARPGX2
Influenza A by PCR: NEGATIVE
Influenza B by PCR: NEGATIVE
SARS Coronavirus 2 by RT PCR: NEGATIVE

## 2020-06-22 LAB — CBG MONITORING, ED: Glucose-Capillary: 201 mg/dL — ABNORMAL HIGH (ref 70–99)

## 2020-06-22 MED ORDER — OXYCODONE-ACETAMINOPHEN 5-325 MG PO TABS
1.0000 | ORAL_TABLET | Freq: Once | ORAL | Status: AC
  Administered 2020-06-22: 1 via ORAL
  Filled 2020-06-22: qty 1

## 2020-06-22 MED ORDER — ACETAMINOPHEN 325 MG PO TABS
650.0000 mg | ORAL_TABLET | Freq: Once | ORAL | Status: AC
  Administered 2020-06-22: 650 mg via ORAL
  Filled 2020-06-22: qty 2

## 2020-06-22 MED ORDER — ONDANSETRON 4 MG PO TBDP
4.0000 mg | ORAL_TABLET | Freq: Once | ORAL | Status: AC
  Administered 2020-06-22: 4 mg via ORAL
  Filled 2020-06-22: qty 1

## 2020-06-22 MED ORDER — HYDROMORPHONE HCL 1 MG/ML IJ SOLN
0.5000 mg | Freq: Once | INTRAMUSCULAR | Status: AC
  Administered 2020-06-22: 0.5 mg via INTRAMUSCULAR
  Filled 2020-06-22: qty 1

## 2020-06-22 MED ORDER — DICLOFENAC SODIUM 1 % EX GEL: 2.0000 g | Freq: Four times a day (QID) | CUTANEOUS | 0 refills | Status: DC

## 2020-06-22 MED ORDER — OXYCODONE-ACETAMINOPHEN 5-325 MG PO TABS: 1.0000 | ORAL_TABLET | Freq: Four times a day (QID) | ORAL | 0 refills | Status: DC | PRN

## 2020-06-22 MED ORDER — HYDROMORPHONE HCL 1 MG/ML IJ SOLN
1.0000 mg | Freq: Once | INTRAMUSCULAR | Status: AC
  Administered 2020-06-22: 1 mg via INTRAVENOUS
  Filled 2020-06-22: qty 1

## 2020-06-22 MED ORDER — KETOROLAC TROMETHAMINE 30 MG/ML IJ SOLN
30.0000 mg | Freq: Once | INTRAMUSCULAR | Status: AC
  Administered 2020-06-22: 30 mg via INTRAVENOUS
  Filled 2020-06-22: qty 1

## 2020-06-22 MED ORDER — ONDANSETRON HCL 4 MG PO TABS: 4.0000 mg | ORAL_TABLET | Freq: Four times a day (QID) | ORAL | 0 refills | Status: DC

## 2020-06-22 MED ORDER — DEXAMETHASONE SODIUM PHOSPHATE 10 MG/ML IJ SOLN
10.0000 mg | Freq: Once | INTRAMUSCULAR | Status: AC
  Administered 2020-06-22: 10 mg via INTRAVENOUS
  Filled 2020-06-22: qty 1

## 2020-06-22 NOTE — ED Notes (Signed)
Pt stood beside the bed,  Stated it was too painful and not enough strength / balance to walk. Pt did not want to try and take one step. Pt stated he hasn't been walking at home for about 3 weeks.

## 2020-06-22 NOTE — Discharge Instructions (Signed)
Prescription given for percocet. Take medication as directed and do not operate machinery, drive a car, or work while taking this medication as it can make you drowsy.   I would start by taking 1 percocet every 6 hours. You may increased this to 2 tablets every 6 hours is pain is not controlled. Do not take this with the tramadol or any other narcotic as it can cause your breathing to slow  Continue the steroid taper from your rheumatologist.  Use the voltargen gel on the joints that are causing you discomfort.   Monitor your symptoms closely and if you get worse come back to the emergency department, otherwise please follow up with your rheumatologist as scheduled.

## 2020-06-22 NOTE — ED Notes (Signed)
Pt verbalized he took his bp meds this morning.

## 2020-06-22 NOTE — ED Triage Notes (Signed)
Pt arrived by [facility]. Pt c/o of pain to the left knee,elbow,  foot and the great toe x 1 week. Pt has hx of gout and has been on prednisone x7 days with no relief.

## 2020-06-22 NOTE — ED Provider Notes (Signed)
[facility] Provider Note   CSN: [id number] Arrival date & time: 06/22/20  1157     History Chief Complaint  Patient presents with  . Leg Pain    Nicholas Turner is a 50 y.o. male.  HPI   Pt is a 50 y/o male with a h/o arthritis, diverticulosis, dm, hld, htn who presents to the ED today for eval of left elbow, left knee, left ankle, and left great toe pain that has been intermittent over the last several months but worsened over the last week. States that he believes his sxs are related to gout.   He was noted to have a fever here but states that he has not noted any fevers at home until today. Denies uri sxs, vomiting, diarrhea, dysuria, frequency, or urgency.   He is chronically on allopurinol and colchicine which he states he has been compliant. Denies any recent alcohol consumption or consumption of red meat/fish.   State she started prednisone 1 week ago. Initially this helped his sxs, but now the symptoms are worse.   States he has followed with rheumatology. States sugars have been well controlled on metformin. Am bs have been around 130.   Past Medical History:  Diagnosis Date  . Arthritis   . Chronic neck pain   . Diabetes mellitus   . Diverticulosis   . DM (diabetes mellitus) ([place]) 09/01/2010      . Fatty liver   . Gastroparesis   . Generalized headaches   . Gout   . Gout 11/26/2011  . Hyperlipidemia   . Hypertension   . HYPERTENSION, UNCONTROLLED 09/09/2009   Qualifier: Diagnosis of  By: Via LPN, Jeani Hawking    . Kidney stones   . Knee pain    feet   . Obesity (BMI 30-39.9) 09/01/2010  . Palpitation   . SLEEP APNEA 09/09/2009   Qualifier: Diagnosis of  By: Via LPN, Jeani Hawking    . UTI (lower urinary tract infection)     Patient Active Problem List   Diagnosis Date Noted  . Personal history of colonic polyps 06/12/2012  . Family history of colonic polyps 04/21/2012  . Hematochezia 04/10/2012  . Abnormal liver function 04/10/2012  . Gout  11/26/2011  . Preventative health care 11/26/2011  . Right knee pain 11/26/2011  . Right ankle pain 11/26/2011  . Arthritis   . Hyperlipidemia   . Kidney stones   . Nausea alone 04/16/2011  . Obesity (BMI 30-39.9) 09/01/2010  . DM (diabetes mellitus) ([place]) 09/01/2010  . HYPERTENSION, UNCONTROLLED 09/09/2009  . SLEEP APNEA 09/09/2009    Past Surgical History:  Procedure Laterality Date  . COLONOSCOPY N/A 05/22/2017   Procedure: COLONOSCOPY;  Surgeon: Daneil Dolin, MD;  Location: [facility];  Service: Endoscopy;  Laterality: N/A;  12:00  . COLONOSCOPY WITH ESOPHAGOGASTRODUODENOSCOPY (EGD) N/A 05/01/2012   CBJ:SEGBTD esophagus. Small hiatal hernia. 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 pylori infection-status post biopsy (H.Pylori +). Minimally excoriated anal canal tissue. No hemorrhoids. Pancolonic diverticulosis. 8 mm polyp distal to the ICV, tubular adenoma. Next colonoscopy in April 2019.  Marland Kitchen ESOPHAGOGASTRODUODENOSCOPY ENDOSCOPY    . KIDNEY STONE SURGERY     post ureteral stent and status post lithotripsy (10-15 yrs Ago)       Family History  Problem Relation Age of Onset  . Arthritis Mother   . Colon polyps Mother   . Hypertension Mother   . Heart attack Father   . Arthritis Other   .  Heart disease Other   . Hypertension Other   . Sudden death Other   . Arthritis Other   . Heart disease Other   . Hypertension Other   . Alcohol abuse Other   . Cancer Other   . Asthma Other        breast cancer  . Heart disease Other   . Hypertension Other   . Diabetes Other   . Lung cancer Maternal Uncle   . Breast cancer Maternal Aunt   . Colon polyps Sister        age 38  . Colon cancer Neg Hx     Social History   Tobacco Use  . Smoking status: Never Smoker  . Smokeless tobacco: Never Used  . Tobacco comment: Never smoker  Substance Use Topics  . Alcohol use: No  . Drug use: No    Home Medications Prior to Admission medications    Medication Sig Start Date End Date Taking? Authorizing Provider  allopurinol (ZYLOPRIM) 100 MG tablet Take 100-200 mg by mouth daily. 05/03/20  Yes [provider]  atorvastatin (LIPITOR) 40 MG tablet Take 1 tablet (40 mg total) by mouth daily. 02/04/20  Yes O'Neal, Cassie Freer, MD  buPROPion (WELLBUTRIN XL) 150 MG 24 hr tablet Take 300 mg by mouth daily.   Yes [provider]  diclofenac Sodium (VOLTAREN) 1 % GEL Apply 2 g topically 4 (four) times daily. 06/22/20  Yes Maelie Chriswell S, PA-C  furosemide (LASIX) 20 MG tablet Take 20 mg by mouth daily as needed for fluid.   Yes [provider]  hydrALAZINE (APRESOLINE) 25 MG tablet Take 25 mg by mouth 3 (three) times daily. 06/13/20  Yes [provider]  metFORMIN (GLUCOPHAGE) 500 MG tablet Take 500 mg by mouth 2 (two) times daily. 02/05/19  Yes [provider]  metoprolol (LOPRESSOR) 100 MG tablet Take 1 tablet (100 mg total) by mouth 2 (two) times daily. 10/20/12  Yes Satira Sark, MD  MITIGARE 0.6 MG CAPS Take 1 capsule by mouth daily. 04/21/19  Yes [provider]  ondansetron (ZOFRAN) 4 MG tablet Take 1 tablet (4 mg total) by mouth every 6 (six) hours. 06/22/20  Yes Jess Toney S, PA-C  oxyCODONE-acetaminophen (PERCOCET/ROXICET) 5-325 MG tablet Take 1 tablet by mouth every 6 (six) hours as needed for severe pain. 06/22/20  Yes Shayon Trompeter S, PA-C  predniSONE (DELTASONE) 10 MG tablet Take 5 mg by mouth as directed. 01/26/19  Yes [provider]  [place] 100-33 UNT-MCG/ML SOPN Inject 40 Units into the skin at bedtime. 04/08/20  Yes [provider]  telmisartan (MICARDIS) 80 MG tablet Take 80 mg by mouth daily. 03/30/20  Yes [provider]  traMADol (ULTRAM) 50 MG tablet Take 50 mg by mouth every 6 (six) hours as needed for moderate pain. 06/16/20  Yes [provider]    Allergies    Clonidine derivatives and Oxycodone  Review of Systems   Review of  Systems  Constitutional: Negative for chills and fever.  HENT: Negative for congestion, rhinorrhea and sore throat.   Respiratory: Negative for shortness of breath.   Cardiovascular: Negative for chest pain.  Gastrointestinal: Negative for abdominal pain, constipation, diarrhea, nausea and vomiting.  Genitourinary: Negative for flank pain.  Musculoskeletal:       Left elbow pain, left knee pain, left ankle pain, left toe pain  Skin: Negative for rash.  Neurological: Negative for weakness and numbness.    Physical Exam Updated Vital Signs  BP (!) 142/93   Pulse 83   Temp 98.4 F (36.9 C) (Oral)   Resp 13   Ht 5\' 9"  (1.753 m)   Wt (!) 142.4 kg   SpO2 93%   BMI 46.37 kg/m   Physical Exam Vitals and nursing note reviewed.  Constitutional:      Appearance: He is well-developed.  HENT:     Head: Normocephalic and atraumatic.  Eyes:     Conjunctiva/sclera: Conjunctivae normal.  Cardiovascular:     Rate and Rhythm: Normal rate and regular rhythm.     Heart sounds: No murmur heard.   Pulmonary:     Effort: Pulmonary effort is normal. No respiratory distress.     Breath sounds: Normal breath sounds.  Abdominal:     Palpations: Abdomen is soft.     Tenderness: There is no abdominal tenderness.  Musculoskeletal:     Cervical back: Neck supple.     Comments: Warmth and swelling noted to the left elbow, left knee, left ankle and left great toe. Pt does have good rom in all joints and has strong pulses distally.  Skin:    General: Skin is warm and dry.  Neurological:     Mental Status: He is alert.     ED Results / Procedures / Treatments   Labs (all labs ordered are listed, but only abnormal results are displayed) Labs Reviewed  CBG MONITORING, ED - Abnormal; Notable for the following components:      Result Value   Glucose-Capillary 201 (*)    All other components within normal limits  RESP PANEL BY RT-PCR (FLU A&B, COVID) ARPGX2    EKG None  Radiology No  results found.  Procedures Procedures  Medications Ordered in ED Medications  oxyCODONE-acetaminophen (PERCOCET/ROXICET) 5-325 MG per tablet 1 tablet (1 tablet Oral Given 06/22/20 1255)  ondansetron (ZOFRAN-ODT) disintegrating tablet 4 mg (4 mg Oral Given 06/22/20 1255)  acetaminophen (TYLENOL) tablet 650 mg (650 mg Oral Given 06/22/20 1255)  HYDROmorphone (DILAUDID) injection 0.5 mg (0.5 mg Intramuscular Given 06/22/20 1256)  HYDROmorphone (DILAUDID) injection 1 mg (1 mg Intravenous Given 06/22/20 1608)  ketorolac (TORADOL) 30 MG/ML injection 30 mg (30 mg Intravenous Given 06/22/20 1607)  dexamethasone (DECADRON) injection 10 mg (10 mg Intravenous Given 06/22/20 1710)    ED Course  I have reviewed the triage vital signs and the nursing notes.  Pertinent labs & imaging results that were available during my care of the patient were reviewed by me and considered in my medical decision making (see chart for details).    MDM Rules/Calculators/A&P                          50 year old male presenting the emergency department today for evaluation of polyarthralgias.  Has a long history of gout and is currently following with rheumatology for further investigation of his symptoms.  His pain became too severe today so he came to the ED.  He did have a marginal temperature on arrival however this is improved after Tylenol.  He was given pain medications and felt significant improvement of his symptoms.  He was able to bear weight and they emergency department.  He states that he feels well enough to go home at this time.  I have very low suspicion for an infectious cause of symptoms at this time given his extended history of similar symptoms.  He is able to range his joints here in the emergency department.  His  COVID/tests are negative.  I have advised him to continue the steroid that he is on at home we will also give him Rx for pain medications and anti-inflammatory gel.  He has an appointment with his  rheumatologist coming this month as well as labs tomorrow.  I have discussed close monitoring of symptoms and strict return precautions and he is in agreement with the plan.  Also discussed patient with Dr. Almyra Free who is in agreement.  Final Clinical Impression(s) / ED Diagnoses Final diagnoses:  Polyarthralgia    Rx / DC Orders ED Discharge Orders         Ordered    oxyCODONE-acetaminophen (PERCOCET/ROXICET) 5-325 MG tablet  Every 6 hours PRN        06/22/20 1707    ondansetron (ZOFRAN) 4 MG tablet  Every 6 hours        06/22/20 1707    diclofenac Sodium (VOLTAREN) 1 % GEL  4 times daily        06/22/20 [address], Willia Craze, PA-C 06/22/20 1841    Luna Fuse, MD 06/24/20 [phone]

## 2020-06-22 NOTE — ED Notes (Signed)
Nurse attempted to walk pt and pt verbalized he can not walk. He tried this morning and was unable because of his elbow pain d/t he uses a walker. Pt stated he can not put any weight on his left foot, it is too painful.

## 2020-07-06 ENCOUNTER — Ambulatory Visit: Payer: 59 | Admitting: Skilled Nursing Facility1

## 2020-07-06 ENCOUNTER — Encounter: Payer: 59 | Attending: General Surgery | Admitting: Skilled Nursing Facility1

## 2020-07-06 DIAGNOSIS — E669 Obesity, unspecified: Secondary | ICD-10-CM

## 2020-07-06 DIAGNOSIS — E119 Type 2 diabetes mellitus without complications: Secondary | ICD-10-CM | POA: Insufficient documentation

## 2020-07-06 NOTE — Progress Notes (Signed)
Supervised Weight Loss Visit Bariatric Nutrition Education  Planned Surgery: sleeve  4 out of 6 SWL Appointments   Appt conducted virtually; pt identified by name and DOB, pt agreeable to limitations of this visit type  NUTRITION ASSESSMENT  Anthropometrics  Start weight at [facility]: 303 lbs (date: 03/02/2020)  Weight: pt unable to weigh at home due to no batteries in scale Height: 69 in BMI:  kg/m2     Clinical  Medical hx: diabetes, GERD, anxiety, depression, kidney stone, sleep apnea, diverticulosis, gastroparesis, fatty liver, stage 2 kidney disease   Medications: metformin, lasix, long actin insulin (does not remember the name), added new blood pressure medicine, Allopurinol 05/04/20 and cotrazine, steroid x 12 days (on day 11).  Metformin making him feel sick to stomach, reduces his appetite. Labs: A1C 10 (stating he was doing a couple rounds of steroids)  Notable signs/symptoms: headaches, extreme pain in his feet Any previous deficiencies? No  Lifestyle & Dietary Hx  Pt states he checks his fastings daily.  120-130 Pt has been painting landscapes as an activity when gout limits his physical activity.  Uses painting apps and his tablet.  Pt states he has been able to do a couple pieces of his meals helping him to feel more independent.  Pt state she is excited he was able to mow the lawn.    Pt states his physcians have told him they are thinking his pain he is experiencing may not actually be gout. Pt states he deals with the pain and stress by sleeping mostly. Pt states his blood pressure has been running up due to the pain he is in. Pt states his children and wife make his meals for him now due to pain upon standing.   Pt sates he was able to cut back on his fruit serving down to one serving per day.  Estimated daily fluid intake: oz Supplements:  Current average weekly physical activity: ADL's  24-Hr Dietary Recall: continued  First Meal: yogurt and oatmeal (2 boiled  eggs + 1 pc avocado toast with 1/4 avocado w/ s/p.  If he eats yogurt, it's greek) Snack: apple (4 - 6 per day, snacks on the slices throughout the day) Second Meal: salad or veggie sub (grilled chicken salad with cukes, tomato, onion, spring mix Snack: almonds Third Meal: grilled chicken (or other grilled meat) and broccoli and potatoes (or peas, limas, or corn) Snack: 2-3 pears, sliced and eaten throughout the day, also strawberries and blueberries throughout the day.  Yogurt as snack before bed.   Beverages: water (adds lemon), diet green tea  Estimated Energy Needs Calories: 1800  NUTRITION DIAGNOSIS  Overweight/obesity ([place]-3.3) related to past poor dietary habits and physical inactivity as evidenced by patient w/ planned sleeve surgery following dietary guidelines for continued weight loss.   NUTRITION INTERVENTION  Nutrition counseling (C-1) and education (E-2) to facilitate bariatric surgery goals.  RD counseled that:  pt may see reduction in A1c related to reduction in fruit intake Tips to reduce saturated fat and why Principles of balance and moderation Answered his questions on food intake post surgery (if he will get enough food in light of reduced appetite related to medication side effects (he reports metformin reduces his appetite).  RD addressed and answered this.  Pre-Op Goals Progress & New Goals Continue: Track food and beverage intake (pen and paper, MyFitness Pal, Baritastic app, etc.) Continue: Practice CHEWING your food (aim for applesauce consistency) Continue: not drinking with meals Continue: decreasing portion sizes  continue: find an activity to occupy yourself during flare ups things that will keep you from increasing your calories when being sedentary  continue: Reduce apple intake to 2 per day continue: Reduce pear intake to 1 per day Continue:  independence by using a chair to make your meals Continue: increase activity by doing leg lifts, side bends,  resistance bands   Handouts Provided Include  N/A  Learning Style & Readiness for Change Teaching method utilized: Visual & Auditory  Demonstrated degree of understanding via: Teach Back  Readiness Level: action Barriers to learning/adherence to lifestyle change: painful gout flare ups  RD's Notes for next Visit  Assess pts adherence to chosen goals    MONITORING & EVALUATION Dietary intake, weekly physical activity, body weight, and pre-op goals in 1 month.   Next Steps  Patient is to return to [facility] for SWL

## 2020-08-04 ENCOUNTER — Encounter: Payer: 59 | Attending: General Surgery | Admitting: Skilled Nursing Facility1

## 2020-08-04 ENCOUNTER — Other Ambulatory Visit: Payer: Self-pay

## 2020-08-04 DIAGNOSIS — E119 Type 2 diabetes mellitus without complications: Secondary | ICD-10-CM | POA: Diagnosis not present

## 2020-08-04 DIAGNOSIS — E669 Obesity, unspecified: Secondary | ICD-10-CM | POA: Diagnosis present

## 2020-08-04 NOTE — Progress Notes (Signed)
Supervised Weight Loss Visit Bariatric Nutrition Education  Planned Surgery: sleeve  5 out of 6 SWL Appointments    NUTRITION ASSESSMENT  Anthropometrics  Start weight at [facility]: 303 lbs (date: 03/02/2020)  Weight: 318 pounds  Height: 69 in BMI: 46.98 kg/m2     Clinical  Medical hx: diabetes, GERD, anxiety, depression, kidney stone, sleep apnea, diverticulosis, gastroparesis, fatty liver, stage 2 kidney disease   Medications: metformin, lasix, long actin insulin (does not remember the name), added new blood pressure medicine, Allopurinol 05/04/20 and cotrazine, steroid x 12 days (on day 11).  Metformin making him feel sick to stomach, reduces his appetite. Labs: A1C 10 (stating he was doing a couple rounds of steroids)  Notable signs/symptoms: headaches, extreme pain in his feet Any previous deficiencies? No  Lifestyle & Dietary Hx  Pt states he checks his fastings daily.  120-130 Pt has been painting landscapes as an activity when gout limits his physical activity.  Uses painting apps and his tablet.  Pt states his physcians have told him they are thinking his pain he is experiencing may not actually be gout. Pt states he deals with the pain and stress by sleeping mostly. Pt states his blood pressure has been running up due to the pain he is in. Pt states his children and wife make his meals for him now due to pain upon standing.   Pt sates he was able to cut back on his fruit serving down to one serving per day.  Pt arrives walking on his own merit which he is really excited about. Pt states he is now able to make his own meals and cut grass. Pt states his daughter gets fresh eggs for them.   Estimated daily fluid intake: oz Supplements:  Current average weekly physical activity: ADL's  24-Hr Dietary Recall: continued 2 snacks a day  First Meal: yogurt and oatmeal (2 boiled eggs + 1 pc avocado toast with 1/4 avocado w/ s/p.  If he eats yogurt, it's greek) or omelets  Snack:  apple + peanut butter Second Meal: salad or veggie sub (grilled chicken salad with cukes, tomato, onion, spring mix Snack: almonds or cheese + crackers Third Meal: stur fry: pepers, mushrooms, corn, onion  Snack: 2-3 pears, sliced and eaten throughout the day, also strawberries and blueberries throughout the day.  Yogurt as snack before bed.   Beverages: water (adds lemon), diet green tea  Estimated Energy Needs Calories: 1800  NUTRITION DIAGNOSIS  Overweight/obesity ([place]-3.3) related to past poor dietary habits and physical inactivity as evidenced by patient w/ planned sleeve surgery following dietary guidelines for continued weight loss.   NUTRITION INTERVENTION  Nutrition counseling (C-1) and education (E-2) to facilitate bariatric surgery goals.  RD counseled that:  pt may see reduction in A1c related to reduction in fruit intake Tips to reduce saturated fat and why Principles of balance and moderation Answered his questions on food intake post surgery (if he will get enough food in light of reduced appetite related to medication side effects (he reports metformin reduces his appetite).  RD addressed and answered this.  Pre-Op Goals Progress & New Goals Continue: Track food and beverage intake (pen and paper, MyFitness Pal, Baritastic app, etc.) Continue: Practice CHEWING your food (aim for applesauce consistency) Continue: not drinking with meals Continue: decreasing portion sizes  continue: find an activity to occupy yourself during flare ups things that will keep you from increasing your calories when being sedentary  continue: Reduce apple intake to 2 per  day continue: Reduce pear intake to 1 per day Continue:  independence by using a chair to make your meals Continue: increase activity by doing leg lifts, side bends, resistance bands  NEW: try edamame in the stir fry NEW: experiment with portion sizes tat lead to weight loss throughout the weeks  Handouts Provided Include   N/A  Learning Style & Readiness for Change Teaching method utilized: Visual & Auditory  Demonstrated degree of understanding via: Teach Back  Readiness Level: action Barriers to learning/adherence to lifestyle change: painful gout flare ups  RD's Notes for next Visit  Assess pts adherence to chosen goals    MONITORING & EVALUATION Dietary intake, weekly physical activity, body weight, and pre-op goals in 1 month.   Next Steps  Patient is to return to [facility] for SWL

## 2020-08-07 ENCOUNTER — Other Ambulatory Visit: Payer: Self-pay | Admitting: Cardiovascular Disease

## 2020-08-31 ENCOUNTER — Ambulatory Visit (INDEPENDENT_AMBULATORY_CARE_PROVIDER_SITE_OTHER): Payer: 59 | Admitting: Psychology

## 2020-08-31 DIAGNOSIS — F509 Eating disorder, unspecified: Secondary | ICD-10-CM | POA: Diagnosis not present

## 2020-09-08 ENCOUNTER — Other Ambulatory Visit: Payer: Self-pay

## 2020-09-08 ENCOUNTER — Encounter: Payer: 59 | Attending: General Surgery | Admitting: Skilled Nursing Facility1

## 2020-09-08 DIAGNOSIS — E669 Obesity, unspecified: Secondary | ICD-10-CM | POA: Diagnosis present

## 2020-09-08 DIAGNOSIS — E119 Type 2 diabetes mellitus without complications: Secondary | ICD-10-CM | POA: Insufficient documentation

## 2020-09-08 NOTE — Progress Notes (Signed)
Supervised Weight Loss Visit Bariatric Nutrition Education  Planned Surgery: sleeve  6 out of 6 SWL Appointments    NUTRITION ASSESSMENT  Anthropometrics  Start weight at [facility]: 303 lbs (date: 03/02/2020)  Weight: 322.5 pounds  Height: 69 in BMI: 47.60 kg/m2     Clinical  Medical hx: diabetes, GERD, anxiety, depression, kidney stone, sleep apnea, diverticulosis, gastroparesis, fatty liver, stage 2 kidney disease   Medications: metformin, lasix, long actin insulin (does not remember the name), added new blood pressure medicine, Allopurinol 05/04/20 and cotrazine, steroid x 12 days (on day 11).  Metformin making him feel sick to stomach, reduces his appetite. Labs: A1C 10 (stating he was doing a couple rounds of steroids)  Notable signs/symptoms: headaches, extreme pain in his feet Any previous deficiencies? No  Lifestyle & Dietary Hx  Pt states he checks his fastings daily.  120-130 Pt has been painting landscapes as an activity when gout limits his physical activity.  Uses painting apps and his tablet.  Pt state she has been doing more yard work lately.  Pt states he just stopped a round of steroids most likely contributing to the weight gain.    Pt states in the last 6 months he has learned how to eat better: no fried food, and more conscious of his food choices, how to move more even when his legs are in pain, and communicate more  Pt states he feels he will have no difficulties after surgery other than ensuring he eats often enough throughout the day or maybe sweet craving  Pts questions answered.   Estimated daily fluid intake: oz Supplements:  Current average weekly physical activity: ADL's  24-Hr Dietary Recall: continued 2 snacks a day  First Meal: egg muffin + green pepper+ olives Snack: apple + peanut butter or yogurt + blueberries Second Meal: salad or veggie sub (grilled chicken salad with cukes, tomato, onion, spring mix Snack: almonds or cheese +  crackers Third Meal: fish + broccoli + potato  Beverages: water (adds lemon), diet green tea  Estimated Energy Needs Calories: 1800  NUTRITION DIAGNOSIS  Overweight/obesity ([place]-3.3) related to past poor dietary habits and physical inactivity as evidenced by patient w/ planned sleeve surgery following dietary guidelines for continued weight loss.   NUTRITION INTERVENTION  Nutrition counseling (C-1) and education (E-2) to facilitate bariatric surgery goals.  RD counseled that: previous appt pt may see reduction in A1c related to reduction in fruit intake Tips to reduce saturated fat and why Principles of balance and moderation Answered his questions on food intake post surgery (if he will get enough food in light of reduced appetite related to medication side effects (he reports metformin reduces his appetite).  RD addressed and answered this.  Pre-Op Goals Progress & New Goals Continue: Track food and beverage intake (pen and paper, MyFitness Pal, Baritastic app, etc.) Continue: Practice CHEWING your food (aim for applesauce consistency) Continue: not drinking with meals Continue: decreasing portion sizes  continue: find an activity to occupy yourself during flare ups things that will keep you from increasing your calories when being sedentary  continue: Reduce apple intake to 2 per day continue: Reduce pear intake to 1 per day Continue:  independence by using a chair to make your meals Continue: increase activity by doing leg lifts, side bends, resistance bands  continue: try edamame in the stir fry continue: experiment with portion sizes tat lead to weight loss throughout the weeks  Handouts Provided Include  N/A  Learning Style & Readiness for  Change Teaching method utilized: Visual & Auditory  Demonstrated degree of understanding via: Teach Back  Readiness Level: action Barriers to learning/adherence to lifestyle change: painful gout flare ups  RD's Notes for next Visit   Assess pts adherence to chosen goals    MONITORING & EVALUATION Dietary intake, weekly physical activity, body weight, and pre-op goals  Next Steps  Patient is to return to [facility] for SWL

## 2020-09-14 ENCOUNTER — Ambulatory Visit: Payer: 59 | Admitting: Psychology

## 2020-10-14 ENCOUNTER — Ambulatory Visit (INDEPENDENT_AMBULATORY_CARE_PROVIDER_SITE_OTHER): Payer: 59 | Admitting: Psychology

## 2020-11-07 ENCOUNTER — Encounter: Payer: 59 | Attending: General Surgery | Admitting: Skilled Nursing Facility1

## 2020-11-07 ENCOUNTER — Other Ambulatory Visit: Payer: Self-pay

## 2020-11-07 DIAGNOSIS — E119 Type 2 diabetes mellitus without complications: Secondary | ICD-10-CM | POA: Insufficient documentation

## 2020-11-07 DIAGNOSIS — E669 Obesity, unspecified: Secondary | ICD-10-CM | POA: Diagnosis present

## 2020-11-07 NOTE — Progress Notes (Signed)
Pre-Operative Nutrition Class:    Patient was seen on 11/07/2020 for Pre-Operative Bariatric Surgery Education at the [facility].    Pt states he is having another Gouty flare so is on prednisone.   Surgery date:  Surgery type: sleeve Start weight at [facility]: 303 Weight today: 328.3  Samples given per MNT protocol. Patient educated on appropriate usage: Ensure max exp: March 22, 2021 Ensure max lot: [id number]  Chewable bariatric advantage: advanced multi EA exp: 08/23 Chewable bariatric advantage: advanced multi EA lot: [id number]  Bariatric advantage calcium citrate exp: 02/23 Bariatric advantage calcium citrate lot: [id number]   The following the learning objectives were met by the patient during this course: Identify Pre-Op Dietary Goals and will begin 2 weeks pre-operatively Identify appropriate sources of fluids and proteins  State protein recommendations and appropriate sources pre and post-operatively Identify Post-Operative Dietary Goals and will follow for 2 weeks post-operatively Identify appropriate multivitamin and calcium sources Describe the need for physical activity post-operatively and will follow MD recommendations State when to call healthcare provider regarding medication questions or post-operative complications When having a diagnosis of diabetes understanding hypoglycemia symptoms and the inclusion of 1 complex carbohydrate per meal  Handouts given during class include: Pre-Op Bariatric Surgery Diet Handout Protein Shake Handout Post-Op Bariatric Surgery Nutrition Handout BELT Program Information Flyer Support Group Information Flyer [facility] Bariatric Supplements Price List  Follow-Up Plan: Patient will follow-up at [facility] 2 weeks post operatively for diet advancement per MD.

## 2020-11-27 ENCOUNTER — Other Ambulatory Visit: Payer: Self-pay | Admitting: Cardiovascular Disease

## 2020-12-22 NOTE — Progress Notes (Signed)
Cardiology Office Note:   Date:  12/23/2020  NAME:  Nicholas Turner    MRN: [id number] DOB:  [birth year]   PCP:  Celene Squibb, MD  Cardiologist:  None  Electrophysiologist:  None   Referring MD: Celene Squibb, MD   Chief Complaint  Patient presents with   Follow-up    History of Present Illness:   [name] is a 50 y.o. male with a hx of DM, HTN, HLD, obesity who presents for follow-up.  He reports over the last several months his blood pressure has been elevated.  He recently has controlled it better with his current regimen.  This includes amlodipine 5 mg daily, hydralazine 50 mg 3 times daily, Toprol tartrate 100 mg twice daily, telmisartan 80 mg daily.  He was going to go for bariatric surgery but insurance will not pay it.  BMI is 48.  He presents with his wife today.  Apparently his sleep apnea machine is now broken.  He was diagnosed with this several years ago.  I suspect this is a big contributor to his high blood pressure.  He does request referral to a new sleep medicine doctor.  I have also recommended he reach out to the company that sold him the device.  He reports he is trying to exercise and diet the best he can.  He can get short of breath with activity.  No chest pain is reported.  Echocardiogram last year was normal.  Overall symptoms are likely related to morbid obesity.  I have asked him to continue to exercise and do well.  His blood pressure is much improved and I suspect values are out of control due to uncontrolled sleep apnea.  Problem List 1. Obesity -BMI 48 2. DM -A1c 7.1 3. HTN 4. HLD - Total cholesterol 155, HDL 44, LDL 80, TG 184 5. OSA  Past Medical History: Past Medical History:  Diagnosis Date   Arthritis    Chronic neck pain    Diabetes mellitus    Diverticulosis    DM (diabetes mellitus) ([place]) 09/01/2010       Fatty liver    Gastroparesis    Generalized headaches    Gout    Gout 11/26/2011   Hyperlipidemia    Hypertension     HYPERTENSION, UNCONTROLLED 09/09/2009   Qualifier: Diagnosis of  By: Via LPN, Jeani Hawking     Kidney stones    Knee pain    feet    Obesity (BMI 30-39.9) 09/01/2010   Palpitation    SLEEP APNEA 09/09/2009   Qualifier: Diagnosis of  By: Via LPN, Jeani Hawking     UTI (lower urinary tract infection)     Past Surgical History: Past Surgical History:  Procedure Laterality Date   COLONOSCOPY N/A 05/22/2017   Procedure: COLONOSCOPY;  Surgeon: Daneil Dolin, MD;  Location: [facility];  Service: Endoscopy;  Laterality: N/A;  12:00   COLONOSCOPY WITH ESOPHAGOGASTRODUODENOSCOPY ([facility]) N/A 05/01/2012   HTD:SKAJGO esophagus. Small hiatal hernia. 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 pylori infection-status post biopsy (H.Pylori +). Minimally excoriated anal canal tissue. No hemorrhoids. Pancolonic diverticulosis. 8 mm polyp distal to the ICV, tubular adenoma. Next colonoscopy in April 2019.   ESOPHAGOGASTRODUODENOSCOPY ENDOSCOPY     KIDNEY STONE SURGERY     post ureteral stent and status post lithotripsy (10-15 yrs Ago)    Current Medications: Current Meds  Medication Sig   allopurinol (ZYLOPRIM) 100 MG tablet Take 100-200 mg by mouth daily.  atorvastatin (LIPITOR) 40 MG tablet Take 1 tablet by mouth once daily   buPROPion (WELLBUTRIN XL) 150 MG 24 hr tablet Take 300 mg by mouth daily.   furosemide (LASIX) 20 MG tablet Take 20 mg by mouth daily as needed for fluid.   hydrALAZINE (APRESOLINE) 25 MG tablet Take 50 mg by mouth 3 (three) times daily.   metFORMIN (GLUCOPHAGE) 500 MG tablet Take 500 mg by mouth 2 (two) times daily.   metoprolol (LOPRESSOR) 100 MG tablet Take 1 tablet (100 mg total) by mouth 2 (two) times daily.   MITIGARE 0.6 MG CAPS Take 1 capsule by mouth daily.   oxyCODONE-acetaminophen (PERCOCET/ROXICET) 5-325 MG tablet Take 1 tablet by mouth every 6 (six) hours as needed for severe pain.   SOLIQUA 100-33 UNT-MCG/ML SOPN Inject 40 Units into the skin at bedtime.    telmisartan (MICARDIS) 80 MG tablet Take 80 mg by mouth daily.   traMADol (ULTRAM) 50 MG tablet Take 50 mg by mouth every 6 (six) hours as needed for moderate pain.     Allergies:    Clonidine derivatives and Oxycodone   Social History: Social History   Socioeconomic History   Marital status: Married    Spouse name: Not on file   Number of children: 3   Years of education: Not on file   Highest education level: Not on file  Occupational History   Occupation: full time     Employer: [employer]    Comment: pest control  Tobacco Use   Smoking status: Never   Smokeless tobacco: Never   Tobacco comments:    Never smoker  Substance and Sexual Activity   Alcohol use: No   Drug use: No   Sexual activity: Not on file  Other Topics Concern   Not on file  Social History Narrative   Not on file   Social Determinants of Health   Financial Resource Strain: Not on file  Food Insecurity: Not on file  Transportation Needs: Not on file  Physical Activity: Not on file  Stress: Not on file  Social Connections: Not on file     Family History: The patient's family history includes Alcohol abuse in an other family member; Arthritis in his mother and other family members; Asthma in an other family member; Breast cancer in his maternal aunt; Cancer in an other family member; Colon polyps in his mother and sister; Diabetes in an other family member; Heart attack in his father; Heart disease in some other family members; Hypertension in his mother and other family members; Lung cancer in his maternal uncle; Sudden death in an other family member. There is no history of Colon cancer.  ROS:   All other ROS reviewed and negative. Pertinent positives noted in the HPI.     EKGs/Labs/Other Studies Reviewed:   The following studies were personally reviewed by me today:  Recent Labs: No results found for requested labs within last 8760 hours.   Recent Lipid Panel    Component Value  Date/Time   CHOL 178 07/11/2012 1650   TRIG 86.0 07/11/2012 1650   HDL 47.00 07/11/2012 1650   CHOLHDL 4 07/11/2012 1650   VLDL 17.2 07/11/2012 1650   LDLCALC 114 (H) 07/11/2012 1650   LDLDIRECT 218.8 11/26/2011 1008    Physical Exam:   VS:  BP 132/72   Pulse 82   Ht 5\' 9"  (1.753 m)   Wt (!) 330 lb (149.7 kg)   SpO2 97%   BMI 48.73 kg/m  Wt Readings from Last 3 Encounters:  12/23/20 (!) 330 lb (149.7 kg)  11/07/20 (!) 328 lb 4.8 oz (148.9 kg)  09/08/20 (!) 322 lb 4.8 oz (146.2 kg)    General: Well nourished, well developed, in no acute distress Head: Atraumatic, normal size  Eyes: PEERLA, EOMI  Neck: Supple, no JVD Endocrine: No thryomegaly Cardiac: Normal S1, S2; RRR; no murmurs, rubs, or gallops Lungs: Clear to auscultation bilaterally, no wheezing, rhonchi or rales  Abd: Soft, nontender, no hepatomegaly  Ext: No edema, pulses 2+ Musculoskeletal: No deformities, BUE and BLE strength normal and equal Skin: Warm and dry, no rashes   Neuro: Alert and oriented to person, place, time, and situation, CNII-XII grossly intact, no focal deficits  Psych: Normal mood and affect   ASSESSMENT:   MCKENNA BORUFF is a 50 y.o. male who presents for the following: 1. Primary hypertension   2. Obesity, morbid, BMI 40.0-49.9 ([place])   3. Sleep apnea, unspecified type     PLAN:   1. Primary hypertension 2. Obesity, morbid, BMI 40.0-49.9 ([place]) 3. Sleep apnea, unspecified type -Blood pressure controlled today.  Suspect he is just required more medications due to untreated sleep apnea.  His value today is 132/72.  We will continue amlodipine 5 mg daily, metoprolol tartrate 100 mg twice daily, hydralazine 25 mg 3 times daily, telmisartan 80 mg daily.  He takes Lasix as needed. -Echo last year was normal.  No signs of congestive heart failure today on exam.  Suspect his venous insufficiency is the bigger issue. -He also has sleep apnea.  I have recommended he get back on his sleep  machine.  Apparently the device he has is broken.  We will refer him to sleep medicine as he may need reevaluation as well.  I believe this is the main issue of why his blood pressure has been uncontrolled over the past few weeks. -We also discussed with him that low-salt diet is recommended.  He also needs to start exercising. -He is morbidly obese.  He has several comorbidities.  I believe bariatric surgery would be the best option for him.  His insurance denied this several months ago.  Hopefully with the new insurance he will be considered a good candidate.  Disposition: Return in about 6 months (around 06/23/2021).  Medication Adjustments/Labs and Tests Ordered: Current medicines are reviewed at length with the patient today.  Concerns regarding medicines are outlined above.  Orders Placed This Encounter  Procedures   Ambulatory referral to Pulmonology   No orders of the defined types were placed in this encounter.   Patient Instructions  Medication Instructions:  Your physician recommends that you continue on your current medications as directed. Please refer to the Current Medication list given to you today.   Labwork: None today   Testing/Procedures: None today   Follow-Up: 6 months Dr.O'Neal  Any Other Special Instructions Will Be Listed Below (If Applicable).   Keep daily blood pressure log   You have been referred to Pulmonary, Dr.Sood to establish as a patient. They will call you to schedule appointment.   If you need a refill on your cardiac medications before your next appointment, please call your pharmacy.   Time Spent with Patient: I have spent a total of 35 minutes with patient reviewing hospital notes, telemetry, EKGs, labs and examining the patient as well as establishing an assessment and plan that was discussed with the patient.  > 50% of time was spent in direct patient care.  Signed, Addison Naegeli. Audie Box, MD, [facility]  [address] [place], [place] [address] [phone]  12/23/2020 1:27 PM

## 2020-12-23 ENCOUNTER — Encounter: Payer: Self-pay | Admitting: Cardiovascular Disease

## 2020-12-23 ENCOUNTER — Ambulatory Visit (INDEPENDENT_AMBULATORY_CARE_PROVIDER_SITE_OTHER): Payer: 59 | Admitting: Cardiovascular Disease

## 2020-12-23 VITALS — BP 132/72 | HR 82 | Ht 69.0 in | Wt 330.0 lb

## 2020-12-23 DIAGNOSIS — G4730 Sleep apnea, unspecified: Secondary | ICD-10-CM

## 2020-12-23 DIAGNOSIS — I10 Essential (primary) hypertension: Secondary | ICD-10-CM

## 2020-12-23 NOTE — Patient Instructions (Signed)
Medication Instructions:  Your physician recommends that you continue on your current medications as directed. Please refer to the Current Medication list given to you today.   Labwork: None today   Testing/Procedures: None today   Follow-Up: 6 months Dr.O'Neal  Any Other Special Instructions Will Be Listed Below (If Applicable).   Keep daily blood pressure log   You have been referred to Pulmonary, Dr.Sood to establish as a patient. They will call you to schedule appointment.   If you need a refill on your cardiac medications before your next appointment, please call your pharmacy.

## 2021-01-05 ENCOUNTER — Other Ambulatory Visit: Payer: Self-pay | Admitting: Cardiovascular Disease

## 2021-02-16 ENCOUNTER — Encounter: Payer: Self-pay | Admitting: Pulmonary Disease

## 2021-02-16 ENCOUNTER — Other Ambulatory Visit: Payer: Self-pay

## 2021-02-16 ENCOUNTER — Ambulatory Visit (INDEPENDENT_AMBULATORY_CARE_PROVIDER_SITE_OTHER): Payer: 59 | Admitting: Pulmonary Disease

## 2021-02-16 VITALS — BP 180/130 | HR 87 | Temp 98.3°F | Ht 69.0 in | Wt 331.0 lb

## 2021-02-16 DIAGNOSIS — G4733 Obstructive sleep apnea (adult) (pediatric): Secondary | ICD-10-CM | POA: Diagnosis not present

## 2021-02-16 NOTE — Progress Notes (Signed)
[facility]  Chief Complaint  Patient presents with   Consult    Patient states that he was diagnosed with OSA in 2010 but has not had a CPAP machine in awhile and is not having trouble with his blood pressure and needs to be checked again and get new cpap machine. Patient states that hes been told he snores and stops breathing when sleeping. Has trouble going to sleep, wakes up 2-3 times a night.    Past Surgical History:  He  has a past surgical history that includes Kidney stone surgery; Colonoscopy with esophagogastroduodenoscopy (egd) (N/A, 05/01/2012); Esophagogastroduodenoscopy endoscopy; and Colonoscopy (N/A, 05/22/2017).  Past Medical History:  OA, Neck pain, DM, Diverticulosis, Fatty liver, Gastroparesis, Headaches, Gout, HLD, HTN, Nephrolithiasis  Constitutional:  BP (!) 180/130 (BP Location: Left Arm, Patient Position: Sitting, Cuff Size: Large)    Pulse 87    Temp 98.3 F (36.8 C) (Oral)    Ht 5\' 9"  (1.753 m)    Wt (!) 331 lb (150.1 kg)    SpO2 100%    BMI 48.88 kg/m   Brief Summary:  Nicholas Turner is a 51 y.o. male with obstructive sleep apnea.      Subjective:   He had a sleep study done in 2011 that showed moderate sleep apnea.  He had a CPAP machine, but it stopped working.  He didn't have adequate insurance coverage at the time and couldn't afford to get a new machine then.  He hasn't used CPAP for several years.  He was seen by cardiology recently for hypertension and advised to have his sleep apnea reassessed.  His wife says he snores and stops breathing while asleep.  He wakes up snoring and is a restless sleeper.  He will talk in his sleep.  He has trouble staying awake when watching TV.  He goes to sleep at 11 pm.  He falls asleep in few minutes.  He wakes up 2 or 3 times to use the bathroom.  He gets out of bed at 8 am.  He feels tired in the morning.  He denies morning headache.  He does not use anything to help him  fall sleep or stay awake.  He denies sleep walking, sleep talking, bruxism, or nightmares.  There is no history of restless legs.  He denies sleep hallucinations, sleep paralysis, or cataplexy.  The Epworth score is 4 out of 24.  Repeat blood pressure in Lt arm measured by me was 182/110.   Physical Exam:   Appearance - well kempt   ENMT - no sinus tenderness, no oral exudate, no LAN, Mallampati 3 airway, no stridor, poor dentition  Respiratory - equal breath sounds bilaterally, no wheezing or rales  CV - s1s2 regular rate and rhythm, no murmurs  Ext - no clubbing, no edema  Skin - no rashes  Psych - normal mood and affect   Sleep Tests:  PSG 11/18/09 >> AHI 23.7, SpO2 low 76%  Cardiac Tests:  Echo 02/18/20 >> EF 60 to 65%, mod LVH, grade 1 DD  Social History:  He  reports that he has never smoked. He has never used smokeless tobacco. He reports that he does not drink alcohol and does not use drugs.  Family History:  His family history includes Alcohol abuse in an other family member; Arthritis in his mother and other family members; Asthma in an other family member; Breast cancer in his maternal aunt; Cancer in an other family  member; Colon polyps in his mother and sister; Diabetes in an other family member; Heart attack in his father; Heart disease in some other family members; Hypertension in his mother and other family members; Lung cancer in his maternal uncle; Sudden death in an other family member.    Discussion:  He has snoring, sleep disruption, apnea, and daytime sleepiness.  He has history of hypertension and prior history of sleep apnea.  His BMI is > 35.  I am concerned he still has significant obstructive sleep apnea.  Assessment/Plan:   Snoring with excessive daytime sleepiness and history of Obstructive sleep apnea. - will need to arrange for a home sleep study  White coat syndrome. - he is followed by Dr. Cassie Freer O'Neal with [facility] for  hypertension -  blood pressure elevated in office today - he reports compliance with medications - states his blood pressure usually runs high when he visits a new doctor - he will monitor blood pressure readings at home - advised him to contact cardiology and/or his PCP if blood pressure remains consistently elevated  Obesity. - discussed how weight can impact sleep and risk for sleep disordered breathing - discussed options to assist with weight loss: combination of diet modification, cardiovascular and strength training exercises  Cardiovascular risk. - had an extensive discussion regarding the adverse health consequences related to untreated sleep disordered breathing - specifically discussed the risks for hypertension, coronary artery disease, cardiac dysrhythmias, cerebrovascular disease, and diabetes - lifestyle modification discussed  Safe driving practices. - discussed how sleep disruption can increase risk of accidents, particularly when driving - safe driving practices were discussed  Therapies for obstructive sleep apnea. - if the sleep study shows significant sleep apnea, then various therapies for treatment were reviewed: CPAP, oral appliance, and surgical interventions  Time Spent Involved in Patient Care on Day of Examination:  48 minutes  Follow up:   Patient Instructions  Will arrange for home sleep study Will call to arrange for follow up after sleep study reviewed  Medication List:   Allergies as of 02/16/2021       Reactions   Clonidine Derivatives Other (See Comments)   Welps   Oxycodone Nausea And Vomiting        Medication List        Accurate as of February 16, 2021 10:11 AM. If you have any questions, ask your nurse or doctor.          allopurinol 100 MG tablet Commonly known as: ZYLOPRIM Take 100-200 mg by mouth daily.   atorvastatin 40 MG tablet Commonly known as: LIPITOR Take 1 tablet by mouth once daily   buPROPion 150 MG 24 hr  tablet Commonly known as: WELLBUTRIN XL Take 300 mg by mouth daily.   furosemide 20 MG tablet Commonly known as: LASIX Take 20 mg by mouth daily as needed for fluid.   hydrALAZINE 25 MG tablet Commonly known as: APRESOLINE Take 50 mg by mouth 3 (three) times daily.   metFORMIN 500 MG tablet Commonly known as: GLUCOPHAGE Take 500 mg by mouth 2 (two) times daily.   metoprolol tartrate 100 MG tablet Commonly known as: LOPRESSOR Take 1 tablet (100 mg total) by mouth 2 (two) times daily.   Mitigare 0.6 MG Caps Generic drug: Colchicine Take 1 capsule by mouth daily.   oxyCODONE-acetaminophen 5-325 MG tablet Commonly known as: PERCOCET/ROXICET Take 1 tablet by mouth every 6 (six) hours as needed for severe pain.   Soliqua 100-33 UNT-MCG/ML Sopn Generic  drug: Insulin Glargine-Lixisenatide Inject 40 Units into the skin at bedtime.   telmisartan 80 MG tablet Commonly known as: MICARDIS Take 80 mg by mouth daily.   traMADol 50 MG tablet Commonly known as: ULTRAM Take 50 mg by mouth every 6 (six) hours as needed for moderate pain.        Signature:  Chesley Mires, MD [place] Pager - [phone] 02/16/2021, 10:11 AM

## 2021-02-16 NOTE — Patient Instructions (Signed)
Will arrange for home sleep study Will call to arrange for follow up after sleep study reviewed  

## 2021-04-14 ENCOUNTER — Ambulatory Visit: Payer: 59

## 2021-04-14 ENCOUNTER — Other Ambulatory Visit: Payer: Self-pay

## 2021-04-14 DIAGNOSIS — G4733 Obstructive sleep apnea (adult) (pediatric): Secondary | ICD-10-CM

## 2021-04-19 ENCOUNTER — Telehealth: Payer: Self-pay | Admitting: Pulmonary Disease

## 2021-04-19 DIAGNOSIS — G4733 Obstructive sleep apnea (adult) (pediatric): Secondary | ICD-10-CM | POA: Diagnosis not present

## 2021-04-19 NOTE — Telephone Encounter (Signed)
HST 04/14/21 >> AHI 30.1, SpO2 low 72% ? ? ?Please inform him that his sleep study shows severe obstructive sleep apnea.  Please arrange for ROV with me or NP to discuss treatment options. ? ? ? ?

## 2021-04-19 NOTE — Telephone Encounter (Signed)
Called and went over results with patient and he voiced understanding. First available appt in [place] if June 2023. Due to transportation issues patient cannot go to [facility] office. Patient agreed to [facility] visit but has not done a mychart visit before. Scheduled him an appt with [facility] NP. Nothing further needed  ?

## 2021-05-24 ENCOUNTER — Telehealth (INDEPENDENT_AMBULATORY_CARE_PROVIDER_SITE_OTHER): Payer: 59 | Admitting: Nurse Practitioner

## 2021-05-24 ENCOUNTER — Encounter: Payer: Self-pay | Admitting: Nurse Practitioner

## 2021-05-24 DIAGNOSIS — G4733 Obstructive sleep apnea (adult) (pediatric): Secondary | ICD-10-CM

## 2021-05-24 NOTE — Assessment & Plan Note (Addendum)
AHI 30.1/h indicating severe OSA. Has persistent daytime fatigue symptoms, snoring, and witnessed apneas. He also has had problems with uncontrolled BP. We discussed how untreated sleep apnea puts an individual at risk for cardiac arrhthymias, pulm HTN, DM, stroke and increases their risk for daytime accidents. We also briefly reviewed treatment options including weight loss, side sleeping position, CPAP therapy and referral to ENT for possible surgical options. Discussed that CPAP therapy was standard for severe OSA. Agreeable to restarting.  ? ?-Order sent to DME for auto 5-20 cmH2O with mask of choice and heated humidification.  ? ?Patient Instructions  ?Start CPAP every night, minimum of 4-6 hours a night.  ?Change equipment every 30 days or as directed by DME. Wash your tubing with warm soap and water daily, hang to dry. Wash humidifier portion weekly.  ?Be aware of reduced alertness and do not drive or operate heavy machinery if experiencing this or drowsiness.  ?Healthy weight management discussed.  ?Avoid or decrease alcohol consumption and medications that make you more sleepy, if possible. ?Notify if persistent daytime sleepiness occurs even with consistent use of CPAP. ? ?Follow up within 31-90 days of starting on CPAP therapy with Dr. Halford Chessman or Katie Neyda [place]ango,NP. If symptoms do not improve or worsen, please contact office for sooner follow up or seek emergency care. ? ? ?

## 2021-05-24 NOTE — Progress Notes (Signed)
Reviewed and agree with assessment/plan. ? ? ?Chesley Mires, MD ?[place] ?05/24/2021, 11:42 AM ?Pager:  [phone] ? ?

## 2021-05-24 NOTE — Progress Notes (Signed)
? ?Patient ID: Nicholas Turner, male     DOB: 1970/05/12, 51 y.o.      MRN: [id number] ? ?Chief Complaint  ?Patient presents with  ? Follow-up  ?  Here to discuss sleep study treatment options.   ? ? ?Virtual Visit via Video Note ? ?I connected with Nicholas Turner on 05/24/21 at  9:00 AM EDT by a video enabled telemedicine application and verified that I am speaking with the correct person using two identifiers. ? ?Location: ?Patient: Home ?Provider: Office ?  ?I discussed the limitations of evaluation and management by telemedicine and the availability of in person appointments. The patient expressed understanding and agreed to proceed. ? ?History of Present Illness: ?51 year old male, never smoker followed for severe obstructive sleep apnea. He is a patient of Dr. Juanetta Gosling and last seen in office 02/16/2021. Past medical history significant for HTN, DM, arthritis, HLD, obesity.  ? ?02/16/2021: OV with Dr. Halford Chessman. Hx of moderate OSA and previously on CPAP therapy. Machine stopped working and he didn't have adequate insurance and couldn't afford a new machine so hadn't used one in several years. Recently encouraged by cardiologist to have repeat sleep study d/t poorly controlled HTN. Wife reported frequent snoring and apneic events at night. Wakes up feeling tired. [place] 4/24. HTN at visit; possible white coat syndrome but advised to follow up with cardiology. HST ordered. ? ?05/24/2021: Today - follow up ?Patient presents today for sleep study follow up. He had HST completed on 04/14/2021 showing severe OSA with AHI 30.1 and SpO2 low 72%. He previously was on CPAP therapy and is agreeable to starting back again. He continues to have daytime fatigue symptoms and wakes feeling tired in the morning. He falls asleep relatively easily and only wakes to use the restroom; gets around 8-9 hours of sleep a night. He denies morning headaches, drowsy driving, narcolepsy or cataplexy. ? ?Allergies  ?Allergen Reactions  ?  Clonidine Derivatives Other (See Comments)  ?  Welps  ? Oxycodone Nausea And Vomiting  ? ?Immunization History  ?Administered Date(s) Administered  ? Influenza-Unspecified 10/22/2017, 11/05/2017  ? ?Past Medical History:  ?Diagnosis Date  ? Arthritis   ? Chronic neck pain   ? Diverticulosis   ? DM (diabetes mellitus) ([place]) 09/01/2010  ?    ? Fatty liver   ? Gastroparesis   ? Generalized headaches   ? Gout 11/26/2011  ? Hyperlipidemia   ? Hypertension   ? Kidney stones   ? Knee pain   ? feet   ? Obesity (BMI 30-39.9) 09/01/2010  ? Palpitation   ? SLEEP APNEA 09/09/2009  ? Qualifier: Diagnosis of  By: Via LPN, Jeani Hawking    ? UTI (lower urinary tract infection)   ? ? ?Tobacco History: ?Social History  ? ?Tobacco Use  ?Smoking Status Never  ?Smokeless Tobacco Never  ?Tobacco Comments  ? Never smoker  ? ?Counseling given: Not Answered ?Tobacco comments: Never smoker ? ? ?Outpatient Medications Prior to Visit  ?Medication Sig Dispense Refill  ? allopurinol (ZYLOPRIM) 100 MG tablet Take 100-200 mg by mouth daily.    ? atorvastatin (LIPITOR) 40 MG tablet Take 1 tablet by mouth once daily 90 tablet 1  ? buPROPion (WELLBUTRIN XL) 150 MG 24 hr tablet Take 300 mg by mouth daily.    ? furosemide (LASIX) 20 MG tablet Take 20 mg by mouth daily as needed for fluid.    ? hydrALAZINE (APRESOLINE) 25 MG tablet Take 50 mg by mouth 3 (three)  times daily.    ? metFORMIN (GLUCOPHAGE) 500 MG tablet Take 500 mg by mouth 2 (two) times daily.    ? metoprolol (LOPRESSOR) 100 MG tablet Take 1 tablet (100 mg total) by mouth 2 (two) times daily. 60 tablet 2  ? MITIGARE 0.6 MG CAPS Take 1 capsule by mouth daily.    ? oxyCODONE-acetaminophen (PERCOCET/ROXICET) 5-325 MG tablet Take 1 tablet by mouth every 6 (six) hours as needed for severe pain. 12 tablet 0  ? SOLIQUA 100-33 UNT-MCG/ML SOPN Inject 40 Units into the skin at bedtime.    ? telmisartan (MICARDIS) 80 MG tablet Take 80 mg by mouth daily.    ? traMADol (ULTRAM) 50 MG tablet Take 50 mg by mouth  every 6 (six) hours as needed for moderate pain.    ? ?No facility-administered medications prior to visit.  ? ?  ?Review of Systems:  ? ?Constitutional: No weight loss or gain, night sweats, fevers, chills. +excessive daytime fatigue ?HEENT: No headaches, difficulty swallowing, tooth/dental problems, or sore throat. No sneezing, itching, ear ache, nasal congestion, or post nasal drip. +snoring ?CV:  No chest pain, orthopnea, PND, swelling in lower extremities, anasarca, dizziness, palpitations, syncope ?Resp: +witnessed nocturnal apneas. No shortness of breath with exertion or at rest. No excess mucus or change in color of mucus. No productive or non-productive. No hemoptysis. No wheezing.  No chest wall deformity ?Skin: No rash, lesions, ulcerations ?MSK:  No joint pain or swelling.  No decreased range of motion.  No back pain. ?Neuro: No dizziness or lightheadedness.  ?Psych: No depression or anxiety. Mood stable.  ? ?Observations/Objective: ?Patient is well-developed, well-nourished in no acute distress; A&Ox3. Resting comfortably at home. Unlabored, regular breathing. Speech is clear and coherent with logical content.  ? ?04/14/2021 HST: AHI 30.1/hr, SpO2 low 72% ? ?Assessment and Plan: ?Severe obstructive sleep apnea ?AHI 30.1/h indicating severe OSA. Has persistent daytime fatigue symptoms, snoring, and witnessed apneas. He also has had problems with uncontrolled BP. We discussed how untreated sleep apnea puts an individual at risk for cardiac arrhthymias, pulm HTN, DM, stroke and increases their risk for daytime accidents. We also briefly reviewed treatment options including weight loss, side sleeping position, CPAP therapy and referral to ENT for possible surgical options. Discussed that CPAP therapy was standard for severe OSA. Agreeable to restarting.  ? ?-Order sent to DME for auto 5-20 cmH2O with mask of choice and heated humidification.  ? ?Patient Instructions  ?Start CPAP every night, minimum of 4-6  hours a night.  ?Change equipment every 30 days or as directed by DME. Wash your tubing with warm soap and water daily, hang to dry. Wash humidifier portion weekly.  ?Be aware of reduced alertness and do not drive or operate heavy machinery if experiencing this or drowsiness.  ?Healthy weight management discussed.  ?Avoid or decrease alcohol consumption and medications that make you more sleepy, if possible. ?Notify if persistent daytime sleepiness occurs even with consistent use of CPAP. ? ?Follow up within 31-90 days of starting on CPAP therapy with Dr. Halford Chessman or Katie Eliud Polo,NP. If symptoms do not improve or worsen, please contact office for sooner follow up or seek emergency care. ? ? ?  ?I discussed the assessment and treatment plan with the patient. The patient was provided an opportunity to ask questions and all were answered. The patient agreed with the plan and demonstrated an understanding of the instructions. ?  ?The patient was advised to call back or seek an in-person evaluation if  the symptoms worsen or if the condition fails to improve as anticipated. ? ?I provided 25 minutes of non-face-to-face time during this encounter. ? ? ?Clayton Bibles, NP  ? ?

## 2021-05-24 NOTE — Patient Instructions (Signed)
Start CPAP every night, minimum of 4-6 hours a night.  ?Change equipment every 30 days or as directed by DME. Wash your tubing with warm soap and water daily, hang to dry. Wash humidifier portion weekly.  ?Be aware of reduced alertness and do not drive or operate heavy machinery if experiencing this or drowsiness.  ?Healthy weight management discussed.  ?Avoid or decrease alcohol consumption and medications that make you more sleepy, if possible. ?Notify if persistent daytime sleepiness occurs even with consistent use of CPAP. ? ?Follow up within 31-90 days of starting on CPAP therapy with Dr. Halford Chessman or Katie Anika Shore,NP. If symptoms do not improve or worsen, please contact office for sooner follow up or seek emergency care. ?

## 2021-05-24 NOTE — Progress Notes (Deleted)
? ?'@Patient'$  ID: Nicholas Turner, male    DOB: October 11, 1970, 51 y.o.   MRN: [id number] ? ?Chief Complaint  ?Patient presents with  ? Follow-up  ?  Here to discuss sleep study treatment options.   ? ? ?Referring provider: ?Celene Squibb, MD ? ?HPI: ? ? ?TEST/EVENTS:  ? ?Allergies  ?Allergen Reactions  ? Clonidine Derivatives Other (See Comments)  ?  Welps  ? Oxycodone Nausea And Vomiting  ? ? ?Immunization History  ?Administered Date(s) Administered  ? Influenza-Unspecified 10/22/2017, 11/05/2017  ? ? ?Past Medical History:  ?Diagnosis Date  ? Arthritis   ? Chronic neck pain   ? Diverticulosis   ? DM (diabetes mellitus) ([place]) 09/01/2010  ?    ? Fatty liver   ? Gastroparesis   ? Generalized headaches   ? Gout 11/26/2011  ? Hyperlipidemia   ? Hypertension   ? Kidney stones   ? Knee pain   ? feet   ? Obesity (BMI 30-39.9) 09/01/2010  ? Palpitation   ? SLEEP APNEA 09/09/2009  ? Qualifier: Diagnosis of  By: Via LPN, Jeani Hawking    ? UTI (lower urinary tract infection)   ? ? ?Tobacco History: ?Social History  ? ?Tobacco Use  ?Smoking Status Never  ?Smokeless Tobacco Never  ?Tobacco Comments  ? Never smoker  ? ?Counseling given: Not Answered ?Tobacco comments: Never smoker ? ? ?Outpatient Medications Prior to Visit  ?Medication Sig Dispense Refill  ? allopurinol (ZYLOPRIM) 100 MG tablet Take 100-200 mg by mouth daily.    ? atorvastatin (LIPITOR) 40 MG tablet Take 1 tablet by mouth once daily 90 tablet 1  ? buPROPion (WELLBUTRIN XL) 150 MG 24 hr tablet Take 300 mg by mouth daily.    ? furosemide (LASIX) 20 MG tablet Take 20 mg by mouth daily as needed for fluid.    ? hydrALAZINE (APRESOLINE) 25 MG tablet Take 50 mg by mouth 3 (three) times daily.    ? metFORMIN (GLUCOPHAGE) 500 MG tablet Take 500 mg by mouth 2 (two) times daily.    ? metoprolol (LOPRESSOR) 100 MG tablet Take 1 tablet (100 mg total) by mouth 2 (two) times daily. 60 tablet 2  ? MITIGARE 0.6 MG CAPS Take 1 capsule by mouth daily.    ? oxyCODONE-acetaminophen  (PERCOCET/ROXICET) 5-325 MG tablet Take 1 tablet by mouth every 6 (six) hours as needed for severe pain. 12 tablet 0  ? SOLIQUA 100-33 UNT-MCG/ML SOPN Inject 40 Units into the skin at bedtime.    ? telmisartan (MICARDIS) 80 MG tablet Take 80 mg by mouth daily.    ? traMADol (ULTRAM) 50 MG tablet Take 50 mg by mouth every 6 (six) hours as needed for moderate pain.    ? ?No facility-administered medications prior to visit.  ? ? ? ?Review of Systems:  ? ?Constitutional: No weight loss or gain, night sweats, fevers, chills, fatigue, or lassitude. ?HEENT: No headaches, difficulty swallowing, tooth/dental problems, or sore throat. No sneezing, itching, ear ache, nasal congestion, or post nasal drip ?CV:  No chest pain, orthopnea, PND, swelling in lower extremities, anasarca, dizziness, palpitations, syncope ?Resp: No shortness of breath with exertion or at rest. No excess mucus or change in color of mucus. No productive or non-productive. No hemoptysis. No wheezing.  No chest wall deformity ?GI:  No heartburn, indigestion, abdominal pain, nausea, vomiting, diarrhea, change in bowel habits, loss of appetite, bloody stools.  ?GU: No dysuria, change in color of urine, urgency or frequency.  No flank pain,  no hematuria  ?Skin: No rash, lesions, ulcerations ?MSK:  No joint pain or swelling.  No decreased range of motion.  No back pain. ?Neuro: No dizziness or lightheadedness.  ?Psych: No depression or anxiety. Mood stable.  ? ? ? ?Physical Exam: ? ?There were no vitals taken for this visit. ? ?GEN: Pleasant, interactive, well-nourished/chronically-ill appearing/acutely-ill appearing/poorly-nourished/morbidly obese; in no acute distress.****** ?HEENT:  Normocephalic and atraumatic. EACs patent bilaterally. TM pearly gray with present light reflex bilaterally. PERRLA. Sclera white. Nasal turbinates pink, moist and patent bilaterally. No rhinorrhea present. Oropharynx pink and moist, without exudate or edema. No lesions,  ulcerations, or postnasal drip.  ?NECK:  Supple w/ fair ROM. No JVD present. Normal carotid impulses w/o bruits. Thyroid symmetrical with no goiter or nodules palpated. No lymphadenopathy.   ?CV: RRR, no m/r/g, no peripheral edema. Pulses intact, +2 bilaterally. No cyanosis, pallor or clubbing. ?PULMONARY:  Unlabored, regular breathing. Clear bilaterally A&P w/o wheezes/rales/rhonchi. No accessory muscle use. No dullness to percussion. ?GI: BS present and normoactive. Soft, non-tender to palpation. No organomegaly or masses detected. No CVA tenderness. ?MSK: No erythema, warmth or tenderness. Cap refil <2 sec all extrem. No deformities or joint swelling noted.  ?Neuro: A/Ox3. No focal deficits noted.   ?Skin: Warm, no lesions or rashe ?Psych: Normal affect and behavior. Judgement and thought content appropriate.  ? ? ? ?Lab Results: ? ?CBC ?   ?Component Value Date/Time  ? WBC 13.1 (H) 09/10/2014 1547  ? RBC 4.41 09/10/2014 1547  ? HGB 13.0 09/10/2014 1547  ? HCT 41.2 09/10/2014 1547  ? PLT 218 09/10/2014 1547  ? MCV 93.4 09/10/2014 1547  ? MCH 29.5 09/10/2014 1547  ? MCHC 31.6 09/10/2014 1547  ? RDW 13.3 09/10/2014 1547  ? LYMPHSABS 1.1 09/10/2014 1547  ? MONOABS 0.9 09/10/2014 1547  ? EOSABS 0.1 09/10/2014 1547  ? BASOSABS 0.0 09/10/2014 1547  ? ? ?BMET ?   ?Component Value Date/Time  ? NA 137 09/10/2014 1547  ? K 3.9 09/10/2014 1547  ? CL 101 09/10/2014 1547  ? CO2 26 09/10/2014 1547  ? GLUCOSE 151 (H) 09/10/2014 1547  ? BUN 18 09/10/2014 1547  ? CREATININE 1.04 09/10/2014 1547  ? CREATININE 0.83 04/01/2012 0840  ? CALCIUM 9.1 09/10/2014 1547  ? GFRNONAA >60 09/10/2014 1547  ? GFRAA >60 09/10/2014 1547  ? ? ?BNP ?No results found for: BNP ? ? ?Imaging: ? ?No results found. ? ? ? ?   ? View : No data to display.  ?  ?  ?  ? ? ?No results found for: NITRICOXIDE ? ? ? ? ? ?Assessment & Plan:  ? ?No problem-specific Assessment & Plan notes found for this encounter. ? ? ?I spent *** minutes of dedicated to the care of  this patient on the date of this encounter to include pre-visit review of records, face-to-face time with the patient discussing conditions above, post visit ordering of testing, clinical documentation with the electronic health record, making appropriate referrals as documented, and communicating necessary findings to members of the patients care team. ? ?Clayton Bibles, NP ?05/24/2021 ? ?Pt aware and understands NP's role.  ? ?

## 2021-06-05 ENCOUNTER — Other Ambulatory Visit (HOSPITAL_COMMUNITY): Payer: Self-pay | Admitting: Family Medicine

## 2021-06-05 DIAGNOSIS — I129 Hypertensive chronic kidney disease with stage 1 through stage 4 chronic kidney disease, or unspecified chronic kidney disease: Secondary | ICD-10-CM

## 2021-06-08 ENCOUNTER — Encounter (HOSPITAL_COMMUNITY): Payer: 59

## 2021-06-16 ENCOUNTER — Ambulatory Visit (HOSPITAL_COMMUNITY)
Admission: RE | Admit: 2021-06-16 | Discharge: 2021-06-16 | Disposition: A | Discharge status: Home / Self Care | Payer: 59 | Source: Ambulatory Visit | Attending: Family Medicine | Admitting: Family Medicine

## 2021-06-16 DIAGNOSIS — I129 Hypertensive chronic kidney disease with stage 1 through stage 4 chronic kidney disease, or unspecified chronic kidney disease: Secondary | ICD-10-CM | POA: Insufficient documentation

## 2021-07-02 NOTE — Progress Notes (Deleted)
Cardiology Office Note:   Date:  07/02/2021  NAME:  Nicholas Turner    MRN: [id number] DOB:  Jun 22, 1970   PCP:  Celene Squibb, MD  Cardiologist:  None  Electrophysiologist:  None   Referring MD: Celene Squibb, MD   No chief complaint on file. ***  History of Present Illness:   Nicholas Turner is a 51 y.o. male with a hx of obesity, HTN, HLD, DM, OSA who presents for follow-up.   Problem List 1. Obesity -BMI 48 2. DM -A1c 7.1 3. HTN 4. HLD - Total cholesterol 155, HDL 44, LDL 80, TG 184 5. OSA  Past Medical History: Past Medical History:  Diagnosis Date   Arthritis    Chronic neck pain    Diverticulosis    DM (diabetes mellitus) ([place]) 09/01/2010       Fatty liver    Gastroparesis    Generalized headaches    Gout 11/26/2011   Hyperlipidemia    Hypertension    Kidney stones    Knee pain    feet    Obesity (BMI 30-39.9) 09/01/2010   Palpitation    SLEEP APNEA 09/09/2009   Qualifier: Diagnosis of  By: Via LPN, Jeani Hawking     UTI (lower urinary tract infection)     Past Surgical History: Past Surgical History:  Procedure Laterality Date   COLONOSCOPY N/A 05/22/2017   Procedure: COLONOSCOPY;  Surgeon: Daneil Dolin, MD;  Location: [facility];  Service: Endoscopy;  Laterality: N/A;  12:00   COLONOSCOPY WITH ESOPHAGOGASTRODUODENOSCOPY (EGD) N/A 05/01/2012   VOZ:DGUYQI esophagus. Small hiatal hernia. 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 pylori infection-status post biopsy (H.Pylori +). Minimally excoriated anal canal tissue. No hemorrhoids. Pancolonic diverticulosis. 8 mm polyp distal to the ICV, tubular adenoma. Next colonoscopy in April 2019.   ESOPHAGOGASTRODUODENOSCOPY ENDOSCOPY     KIDNEY STONE SURGERY     post ureteral stent and status post lithotripsy (10-15 yrs Ago)    Current Medications: No outpatient medications have been marked as taking for the 07/04/21 encounter (Appointment) with O'Neal, Cassie Freer, MD.      Allergies:    Clonidine derivatives and Oxycodone   Social History: Social History   Socioeconomic History   Marital status: Married    Spouse name: Not on file   Number of children: 3   Years of education: Not on file   Highest education level: Not on file  Occupational History   Occupation: full time     Employer: [employer]    Comment: pest control  Tobacco Use   Smoking status: Never   Smokeless tobacco: Never   Tobacco comments:    Never smoker  Vaping Use   Vaping Use: Never used  Substance and Sexual Activity   Alcohol use: No   Drug use: No   Sexual activity: Not on file  Other Topics Concern   Not on file  Social History Narrative   Not on file   Social Determinants of Health   Financial Resource Strain: Not on file  Food Insecurity: Not on file  Transportation Needs: Not on file  Physical Activity: Not on file  Stress: Not on file  Social Connections: Not on file     Family History: The patient's ***family history includes Alcohol abuse in an other family member; Arthritis in his mother and other family members; Asthma in an other family member; Breast cancer in his maternal aunt; Cancer in an other family member;  Colon polyps in his mother and sister; Diabetes in an other family member; Heart attack in his father; Heart disease in some other family members; Hypertension in his mother and other family members; Lung cancer in his maternal uncle; Sudden death in an other family member. There is no history of Colon cancer.  ROS:   All other ROS reviewed and negative. Pertinent positives noted in the HPI.     EKGs/Labs/Other Studies Reviewed:   The following studies were personally reviewed by me today:  EKG:  EKG is *** ordered today.  The ekg ordered today demonstrates ***, and was personally reviewed by me.   TTE 02/18/2020   1. Left ventricular ejection fraction, by estimation, is 60 to 65%. The  left ventricle has normal function. The left  ventricle has no regional  wall motion abnormalities. There is moderate left ventricular hypertrophy.  Left ventricular diastolic  parameters are consistent with Grade I diastolic dysfunction (impaired  relaxation).   2. Right ventricular systolic function is normal. The right ventricular  size is normal.   3. The mitral valve is normal in structure. No evidence of mitral valve  regurgitation. No evidence of mitral stenosis.   4. The aortic valve is tricuspid. Aortic valve regurgitation is not  visualized. No aortic stenosis is present.   Recent Labs: No results found for requested labs within last 365 days.   Recent Lipid Panel    Component Value Date/Time   CHOL 178 07/11/2012 1650   TRIG 86.0 07/11/2012 1650   HDL 47.00 07/11/2012 1650   CHOLHDL 4 07/11/2012 1650   VLDL 17.2 07/11/2012 1650   LDLCALC 114 (H) 07/11/2012 1650   LDLDIRECT 218.8 11/26/2011 1008    Physical Exam:   VS:  There were no vitals taken for this visit.   Wt Readings from Last 3 Encounters:  02/16/21 (!) 331 lb (150.1 kg)  12/23/20 (!) 330 lb (149.7 kg)  11/07/20 (!) 328 lb 4.8 oz (148.9 kg)    General: Well nourished, well developed, in no acute distress Head: Atraumatic, normal size  Eyes: PEERLA, EOMI  Neck: Supple, no JVD Endocrine: No thryomegaly Cardiac: Normal S1, S2; RRR; no murmurs, rubs, or gallops Lungs: Clear to auscultation bilaterally, no wheezing, rhonchi or rales  Abd: Soft, nontender, no hepatomegaly  Ext: No edema, pulses 2+ Musculoskeletal: No deformities, BUE and BLE strength normal and equal Skin: Warm and dry, no rashes   Neuro: Alert and oriented to person, place, time, and situation, CNII-XII grossly intact, no focal deficits  Psych: Normal mood and affect   ASSESSMENT:   Nicholas Turner is a 51 y.o. male who presents for the following: No diagnosis found.  PLAN:   There are no diagnoses linked to this encounter.  {Are you ordering a CV Procedure (e.g. stress  test, cath, DCCV, TEE, etc)?   Press F2        :[id number]}  Disposition: No follow-ups on file.  Medication Adjustments/Labs and Tests Ordered: Current medicines are reviewed at length with the patient today.  Concerns regarding medicines are outlined above.  No orders of the defined types were placed in this encounter.  No orders of the defined types were placed in this encounter.   There are no Patient Instructions on file for this visit.   Time Spent with Patient: I have spent a total of *** minutes with patient reviewing hospital notes, telemetry, EKGs, labs and examining the patient as well as establishing an assessment and plan that was  discussed with the patient.  > 50% of time was spent in direct patient care.  Signed, Addison Naegeli. Audie Box, MD, [facility]  [address], [place], [place] [address] [phone]  07/02/2021 7:38 PM

## 2021-07-04 ENCOUNTER — Ambulatory Visit: Payer: 59 | Admitting: Cardiovascular Disease

## 2021-07-04 DIAGNOSIS — I10 Essential (primary) hypertension: Secondary | ICD-10-CM

## 2021-07-04 DIAGNOSIS — G4730 Sleep apnea, unspecified: Secondary | ICD-10-CM

## 2021-07-06 ENCOUNTER — Encounter (HOSPITAL_COMMUNITY): Payer: Self-pay | Admitting: *Deleted

## 2021-07-06 ENCOUNTER — Emergency Department (HOSPITAL_COMMUNITY)
Admission: EM | Admit: 2021-07-06 | Discharge: 2021-07-06 | Disposition: A | Discharge status: Home / Self Care | Payer: 59 | Attending: Emergency Medicine | Admitting: Emergency Medicine

## 2021-07-06 ENCOUNTER — Other Ambulatory Visit: Payer: Self-pay

## 2021-07-06 DIAGNOSIS — R42 Dizziness and giddiness: Secondary | ICD-10-CM | POA: Diagnosis present

## 2021-07-06 DIAGNOSIS — I509 Heart failure, unspecified: Secondary | ICD-10-CM | POA: Insufficient documentation

## 2021-07-06 DIAGNOSIS — I959 Hypotension, unspecified: Secondary | ICD-10-CM | POA: Insufficient documentation

## 2021-07-06 LAB — BASIC METABOLIC PANEL
Anion gap: 8 (ref 5–15)
BUN: 17 mg/dL (ref 6–20)
CO2: 25 mmol/L (ref 22–32)
Calcium: 9.2 mg/dL (ref 8.9–10.3)
Chloride: 105 mmol/L (ref 98–111)
Creatinine, Ser: 1.39 mg/dL — ABNORMAL HIGH (ref 0.61–1.24)
GFR, Estimated: >60 mL/min (ref >60)
Glucose, Bld: 156 mg/dL — ABNORMAL HIGH (ref 70–99)
Potassium: 4.2 mmol/L (ref 3.5–5.1)
Sodium: 138 mmol/L (ref 135–145)

## 2021-07-06 LAB — CBC WITH DIFFERENTIAL/PLATELET
Abs Immature Granulocytes: 0.03 10*3/uL (ref 0.00–0.07)
Basophils Absolute: 0.1 10*3/uL (ref 0.0–0.1)
Basophils Relative: 1 %
Eosinophils Absolute: 0.3 10*3/uL (ref 0.0–0.5)
Eosinophils Relative: 4 %
HCT: 45.2 % (ref 39.0–52.0)
Hemoglobin: 13.9 g/dL (ref 13.0–17.0)
Immature Granulocytes: 1 %
Lymphocytes Relative: 38 %
Lymphs Abs: 2.2 10*3/uL (ref 0.7–4.0)
MCH: 30.2 pg (ref 26.0–34.0)
MCHC: 30.8 g/dL (ref 30.0–36.0)
MCV: 98.3 fL (ref 80.0–100.0)
Monocytes Absolute: 0.6 10*3/uL (ref 0.1–1.0)
Monocytes Relative: 11 %
Neutro Abs: 2.7 10*3/uL (ref 1.7–7.7)
Neutrophils Relative %: 45 %
Platelets: 183 10*3/uL (ref 150–400)
RBC: 4.6 MIL/uL (ref 4.22–5.81)
RDW: 14.2 % (ref 11.5–15.5)
WBC: 5.9 10*3/uL (ref 4.0–10.5)
nRBC: 0 % (ref 0.0–0.2)

## 2021-07-06 LAB — TROPONIN I (HIGH SENSITIVITY): Troponin I (High Sensitivity): 7 ng/L (ref <18)

## 2021-07-06 LAB — BRAIN NATRIURETIC PEPTIDE: B Natriuretic Peptide: 34 pg/mL (ref 0.0–100.0)

## 2021-07-06 NOTE — ED Triage Notes (Signed)
Pt states he felt lightheadedness earlier and BP was 80's for SBP while laying down.  127/84 in triage. Still feel a little lightheadedness currently but better than before.

## 2021-07-06 NOTE — ED Provider Notes (Signed)
[facility] EMERGENCY DEPARTMENT Provider Note   CSN: [id number] Arrival date & time: 07/06/21  1953     History  Chief Complaint  Patient presents with   Hypotension    Nicholas Turner is a 51 y.o. male presenting from home with concern for hypotension and lightheadedness.  Patient reports that he had episodes of feeling lightheaded today, and when he checked his home blood pressure, he noted it was 80/60, which is quite low for him.  He typically runs around 423 systolic.  He has been on the same blood pressure medications for several months.  He does take multiple medicines including metoprolol 100 mg twice a day, hydralazine 50 mg 3 times a day, and losartan, as well as Lasix.  He has been compliant with all of his medications.  He does not drink "a whole lot of water".  He denies new leg swelling or shortness of breath or chest pressure  HPI     Home Medications Prior to Admission medications   Medication Sig Start Date End Date Taking? Authorizing Provider  allopurinol (ZYLOPRIM) 100 MG tablet Take 100-200 mg by mouth daily. 05/03/20  Yes [provider]  atorvastatin (LIPITOR) 40 MG tablet Take 1 tablet by mouth once daily 01/05/21  Yes O'Neal, Cassie Freer, MD  buPROPion (WELLBUTRIN XL) 150 MG 24 hr tablet Take 300 mg by mouth daily.   Yes [provider]  colchicine 0.6 MG tablet Take 0.6 mg by mouth daily. 06/17/21  Yes [provider]  furosemide (LASIX) 20 MG tablet Take 20 mg by mouth daily as needed for fluid.   Yes [provider]  hydrALAZINE (APRESOLINE) 25 MG tablet Take 50 mg by mouth 3 (three) times daily. 06/13/20  Yes [provider]  metFORMIN (GLUCOPHAGE) 500 MG tablet Take 500 mg by mouth 2 (two) times daily. 02/05/19  Yes [provider]  metoprolol (LOPRESSOR) 100 MG tablet Take 1 tablet (100 mg total) by mouth 2 (two) times daily. 10/20/12  Yes Satira Sark, MD  NIFEdipine (ADALAT CC) 30 MG 24 hr  tablet Take 30 mg by mouth daily. 06/15/21  Yes [provider]  [place] 100-33 UNT-MCG/ML SOPN Inject 42 Units into the skin at bedtime. 04/08/20  Yes [provider]  telmisartan (MICARDIS) 80 MG tablet Take 80 mg by mouth daily. 03/30/20  Yes [provider]      Allergies    Clonidine derivatives and Oxycodone    Review of Systems   Review of Systems  Physical Exam Updated Vital Signs BP 110/77   Pulse 77   Temp 98 F (36.7 C)   Resp (!) 21   Ht '5\' 9"'$  (1.753 m)   Wt (!) 145.2 kg   SpO2 97%   BMI 47.26 kg/m  Physical Exam Constitutional:      General: He is not in acute distress.    Appearance: He is obese.  HENT:     Head: Normocephalic and atraumatic.  Eyes:     Conjunctiva/sclera: Conjunctivae normal.     Pupils: Pupils are equal, round, and reactive to light.  Cardiovascular:     Rate and Rhythm: Normal rate and regular rhythm.  Pulmonary:     Effort: Pulmonary effort is normal. No respiratory distress.  Abdominal:     General: There is no distension.     Tenderness: There is no abdominal tenderness.  Skin:    General: Skin is warm and dry.  Neurological:     General:  No focal deficit present.     Mental Status: He is alert. Mental status is at baseline.  Psychiatric:        Mood and Affect: Mood normal.        Behavior: Behavior normal.     ED Results / Procedures / Treatments   Labs (all labs ordered are listed, but only abnormal results are displayed) Labs Reviewed  BASIC METABOLIC PANEL - Abnormal; Notable for the following components:      Result Value   Glucose, Bld 156 (*)    Creatinine, Ser 1.39 (*)    All other components within normal limits  CBC WITH DIFFERENTIAL/PLATELET  BRAIN NATRIURETIC PEPTIDE  TROPONIN I (HIGH SENSITIVITY)    EKG EKG Interpretation  Date/Time:  Thursday July 06 2021 21:33:07 EDT Ventricular Rate:  73 PR Interval:  158 QRS Duration: 86 QT Interval:  421 QTC Calculation: 464 R  Axis:   5 Text Interpretation: Sinus rhythm Ventricular premature complex Confirmed by Octaviano Glow [phone]) on 07/06/2021 10:04:58 PM  Radiology No results found.  Procedures Procedures    Medications Ordered in ED Medications - No data to display  ED Course/ Medical Decision Making/ A&P Clinical Course as of 07/06/21 2307  Thu Jul 06, 2021  2206 Orthostatic vital signs are normal, blood work is unremarkable. [MT]  2257 Patient blood pressure has been stable throughout his stay in the ED.  I doubt this is ACS.  Unlikely PE, no tachycardia or hypoxia.  He reports his blood pressure has been 761P to 509T systolic all week, which appears to be very tightly regulated, considering he is on 3 blood pressure medications.  I would advise that he hold his blood pressure medicine tonight, and that he hold his hydralazine tomorrow, and not resume it unless his blood pressure is again escalating.  This is a temporary plan until he can get into see his PCP or cardiologist who is managing his blood pressure.  They will call them tomorrow [MT]    Clinical Course User Index [MT] Milik Gilreath, Carola Rhine, MD                           Medical Decision Making Amount and/or Complexity of Data Reviewed Labs: ordered. ECG/medicine tests: ordered.   This patient presents to the ED with concern for hypotension, lightheadedness. This involves an extensive number of treatment options, and is a complaint that carries with it a high risk of complications and morbidity.  The differential diagnosis includes orthostatic hypotension versus dehydration versus anemia versus electrolyte derangement versus arrhythmia versus other  Co-morbidities that complicate the patient evaluation: History of congestive heart failure  He does not show overt signs of congestive heart failure exacerbation at this time.  No evidence of pneumothorax.  Additional history obtained from patient's wife at bedside  External records from  outside source obtained and reviewed including most recent echocardiogram in January 2022 with EF 60 to 65%, moderate left ventricular hypertrophy, otherwise no significant disease noted  I ordered and personally interpreted labs.  The pertinent results include: Troponin, BNP, electrolytes, hemoglobin unremarkable.  With no persistent headache or lightheadedness or neurological symptoms, I do not feel that emergent imaging of the brain was indicated at this time.  Low suspicion for pneumothorax, not feel that x-ray imaging needed at this time.  The patient was maintained on a cardiac monitor.  I personally viewed and interpreted the cardiac monitored which showed an underlying rhythm of:  Normal sinus rhythm occasional PVCs  Per my interpretation the patient's ECG shows normal sinus rhythm no acute ischemic finding  I reviewed the patient's home medications and made adjustments as detailed in his discharge instructions  After the interventions noted above, I reevaluated the patient and found that they have: improved   Dispostion:  After consideration of the diagnostic results and the patients response to treatment, I feel that the patent would benefit from close outpatient follow-up         Final Clinical Impression(s) / ED Diagnoses Final diagnoses:  Hypotension, unspecified hypotension type    Rx / DC Orders ED Discharge Orders     None         Wyvonnia Dusky, MD 07/06/21 2307

## 2021-07-06 NOTE — Discharge Instructions (Addendum)
I recommend that you not take your blood pressure medications this evening because of your low blood pressure.  You can resume your medicines tomorrow morning, except for the hydralazine, which you will hold for now.  You should only take hydralazine if your systolic blood pressure, or top number, is above 180 mmhg.  Please call your doctors office tomorrow to talk about your blood pressure medications.  Remember to drink plenty of water at home.

## 2021-08-31 ENCOUNTER — Other Ambulatory Visit: Payer: Self-pay | Admitting: Cardiovascular Disease

## 2021-09-08 NOTE — Progress Notes (Unsigned)
Cardiology Office Note:   Date:  09/13/2021  NAME:  Nicholas Turner    MRN: [id number] DOB:  [birth year]   PCP:  Celene Squibb, MD  Cardiologist:  None  Electrophysiologist:  None   Referring MD: Celene Squibb, MD   Chief Complaint  Patient presents with   Follow-up   History of Present Illness:   Nicholas Turner is a 51 y.o. male with a hx of morbid obesity, hypertension, OSA who presents for follow-up.  Blood pressure elevated today.  He tells me at home it runs between 120-130.  He does have severe obstructive sleep apnea as I suspected.  Still has been unable to get a CPAP machine.  Had difficulty with his prior insurance provider.  He does have some shortness of breath with activity.  Weight is 332 pounds.  Continues to remain around 330 pounds.  He does have lower extremity edema.  He takes Lasix as needed.  The left leg swells more than the right leg.  He had a DVT study a few years ago that was unremarkable.  Would discussed repeating this.  Echocardiogram in 2021 was unremarkable.  Actually was seen in the emergency room in June of this year for hypotension.  Hydralazine was reduced.  Seems to be doing better since being discharged from the hospital.  He is working on his diabetes.  He apparently tried to get bariatric surgery but was unable to get this done because of his insurance carrier.  Problem List 1. Obesity -BMI 48 2. DM -A1c 7.1 3. HTN 4. HLD - Total cholesterol 155, HDL 44, LDL 80, TG 184 5. OSA -severe  Past Medical History: Past Medical History:  Diagnosis Date   Arthritis    Chronic neck pain    Diverticulosis    DM (diabetes mellitus) ([place]) 09/01/2010       Fatty liver    Gastroparesis    Generalized headaches    Gout 11/26/2011   Hyperlipidemia    Hypertension    Kidney stones    Knee pain    feet    Obesity (BMI 30-39.9) 09/01/2010   Palpitation    SLEEP APNEA 09/09/2009   Qualifier: Diagnosis of  By: Via LPN, Jeani Hawking     UTI (lower urinary  tract infection)     Past Surgical History: Past Surgical History:  Procedure Laterality Date   COLONOSCOPY N/A 05/22/2017   Procedure: COLONOSCOPY;  Surgeon: Daneil Dolin, MD;  Location: [facility];  Service: Endoscopy;  Laterality: N/A;  12:00   COLONOSCOPY WITH ESOPHAGOGASTRODUODENOSCOPY (EGD) N/A 05/01/2012   GGY:IRSWNI esophagus. Small hiatal hernia. 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 pylori infection-status post biopsy (H.Pylori +). Minimally excoriated anal canal tissue. No hemorrhoids. Pancolonic diverticulosis. 8 mm polyp distal to the ICV, tubular adenoma. Next colonoscopy in April 2019.   ESOPHAGOGASTRODUODENOSCOPY ENDOSCOPY     KIDNEY STONE SURGERY     post ureteral stent and status post lithotripsy (10-15 yrs Ago)    Current Medications: Current Meds  Medication Sig   allopurinol (ZYLOPRIM) 100 MG tablet Take 100-200 mg by mouth daily.   atorvastatin (LIPITOR) 40 MG tablet Take 1 tablet (40 mg total) by mouth daily. Please keep appointment for future refill.   buPROPion (WELLBUTRIN XL) 150 MG 24 hr tablet Take 300 mg by mouth daily.   colchicine 0.6 MG tablet Take 0.6 mg by mouth daily.   furosemide (LASIX) 20 MG tablet Take 20 mg by mouth daily  as needed for fluid.   hydrALAZINE (APRESOLINE) 25 MG tablet Take 50 mg by mouth in the morning and at bedtime.   metFORMIN (GLUCOPHAGE) 500 MG tablet Take 500 mg by mouth 2 (two) times daily.   metoprolol (LOPRESSOR) 100 MG tablet Take 1 tablet (100 mg total) by mouth 2 (two) times daily.   NIFEdipine (ADALAT CC) 30 MG 24 hr tablet Take 30 mg by mouth daily.   [place] 100-33 UNT-MCG/ML SOPN Inject 42 Units into the skin at bedtime.   telmisartan (MICARDIS) 80 MG tablet Take 80 mg by mouth daily.     Allergies:    Clonidine derivatives and Oxycodone   Social History: Social History   Socioeconomic History   Marital status: Married    Spouse name: Not on file   Number of children: 3    Years of education: Not on file   Highest education level: Not on file  Occupational History   Occupation: full time     Employer: [employer]    Comment: pest control  Tobacco Use   Smoking status: Never   Smokeless tobacco: Never   Tobacco comments:    Never smoker  Vaping Use   Vaping Use: Never used  Substance and Sexual Activity   Alcohol use: No   Drug use: No   Sexual activity: Not on file  Other Topics Concern   Not on file  Social History Narrative   Not on file   Social Determinants of Health   Financial Resource Strain: Not on file  Food Insecurity: Not on file  Transportation Needs: Not on file  Physical Activity: Not on file  Stress: Not on file  Social Connections: Not on file     Family History: The patient's family history includes Alcohol abuse in an other family member; Arthritis in his mother and other family members; Asthma in an other family member; Breast cancer in his maternal aunt; Cancer in an other family member; Colon polyps in his mother and sister; Diabetes in an other family member; Heart attack in his father; Heart disease in some other family members; Hypertension in his mother and other family members; Lung cancer in his maternal uncle; Sudden death in an other family member. There is no history of Colon cancer.  ROS:   All other ROS reviewed and negative. Pertinent positives noted in the HPI.     EKGs/Labs/Other Studies Reviewed:   The following studies were personally reviewed by me today:  EKG:  EKG is ordered today.  The ekg ordered today demonstrates normal sinus rhythm heart rate 79, LVH by voltage, nonspecific ST-T changes, and was personally reviewed by me.   TTE 02/18/2020  1. Left ventricular ejection fraction, by estimation, is 60 to 65%. The  left ventricle has normal function. The left ventricle has no regional  wall motion abnormalities. There is moderate left ventricular hypertrophy.  Left ventricular diastolic   parameters are consistent with Grade I diastolic dysfunction (impaired  relaxation).   2. Right ventricular systolic function is normal. The right ventricular  size is normal.   3. The mitral valve is normal in structure. No evidence of mitral valve  regurgitation. No evidence of mitral stenosis.   4. The aortic valve is tricuspid. Aortic valve regurgitation is not  visualized. No aortic stenosis is present.   Recent Labs: 07/06/2021: B Natriuretic Peptide 34.0; BUN 17; Creatinine, Ser 1.39; Hemoglobin 13.9; Platelets 183; Potassium 4.2; Sodium 138   Recent Lipid Panel  Component Value Date/Time   CHOL 178 07/11/2012 1650   TRIG 86.0 07/11/2012 1650   HDL 47.00 07/11/2012 1650   CHOLHDL 4 07/11/2012 1650   VLDL 17.2 07/11/2012 1650   LDLCALC 114 (H) 07/11/2012 1650   LDLDIRECT 218.8 11/26/2011 1008    Physical Exam:   VS:  BP (!) 150/98   Pulse 79   Ht '5\' 9"'$  (1.753 m)   Wt (!) 332 lb 12.8 oz (151 kg)   SpO2 97%   BMI 49.15 kg/m    Wt Readings from Last 3 Encounters:  09/13/21 (!) 332 lb 12.8 oz (151 kg)  07/06/21 (!) 320 lb (145.2 kg)  02/16/21 (!) 331 lb (150.1 kg)    General: Well nourished, well developed, in no acute distress Head: Atraumatic, normal size  Eyes: PEERLA, EOMI  Neck: Supple, no JVD Endocrine: No thryomegaly Cardiac: Normal S1, S2; RRR; no murmurs, rubs, or gallops Lungs: Clear to auscultation bilaterally, no wheezing, rhonchi or rales  Abd: Soft, nontender, no hepatomegaly  Ext: Trace edema, left greater than right Musculoskeletal: No deformities, BUE and BLE strength normal and equal Skin: Warm and dry, no rashes   Neuro: Alert and oriented to person, place, time, and situation, CNII-XII grossly intact, no focal deficits  Psych: Normal mood and affect   ASSESSMENT:   ZAFAR DEBROSSE is a 51 y.o. male who presents for the following: 1. Primary hypertension   2. Obesity, morbid, BMI 40.0-49.9 ([place])   3. Sleep apnea, unspecified type   4.  Leg edema     PLAN:   1. Primary hypertension -Driven by his morbid obesity.  Values not controlled today.  Informs me it is well controlled at home.  We will not change his current regimen which includes metoprolol tartrate 100 mg twice daily, hydralazine 25 mg twice daily, telmisartan 80 mg daily, nifedipine 30 mg daily.  He does take Lasix as needed.  We discussed that the mainstay of treatment will be salt reduction as well as treatment of his severe sleep apnea.  Unable to get the sleep machine quite yet.  He is working with his new Conservation officer, nature.  He also would likely benefit from bariatric surgery.  We discussed Ozempic and other agents that may help him expedite his weight loss goals.  Overall no firm diagnosis of congestive heart failure but if he continues with his uncontrolled comorbidities he will undoubtably develop congestive heart failure.  2. Obesity, morbid, BMI 40.0-49.9 ([place]) -Weight loss recommended.  3. Sleep apnea, unspecified type -Severe sleep apnea.  With severe hypoxemia.  Has been unable to get a CPAP as of lately.  4. Leg edema -Left greater than right.  We will set him up for a DVT study.  No pain in his calf.  Suspect this could just a obesity related.  Continue the Lasix as needed.  Disposition: Return in about 6 months (around 03/16/2022).  Medication Adjustments/Labs and Tests Ordered: Current medicines are reviewed at length with the patient today.  Concerns regarding medicines are outlined above.  Orders Placed This Encounter  Procedures   EKG 12-Lead   VAS [place] LOWER EXTREMITY VENOUS (DVT)   No orders of the defined types were placed in this encounter.   Patient Instructions  Medication Instructions:  Your physician recommends that you continue on your current medications as directed. Please refer to the Current Medication list given to you today.   Labwork: None  Testing/Procedures: Lower Extremity US   Follow-Up: Follow up with Dr.  Jenetta Downer'  Nori Riis in 6 months.   Any Other Special Instructions Will Be Listed Below (If Applicable).  Continue to check bp daily   If you need a refill on your cardiac medications before your next appointment, please call your pharmacy.    Time Spent with Patient: I have spent a total of 35 minutes with patient reviewing hospital notes, telemetry, EKGs, labs and examining the patient as well as establishing an assessment and plan that was discussed with the patient.  > 50% of time was spent in direct patient care.  Signed, Addison Naegeli. Audie Box, MD, [facility]  [address] [place], [name] [address] [phone]  09/13/2021 1:19 PM

## 2021-09-13 ENCOUNTER — Ambulatory Visit: Payer: 59 | Admitting: Cardiovascular Disease

## 2021-09-13 ENCOUNTER — Encounter: Payer: Self-pay | Admitting: Cardiovascular Disease

## 2021-09-13 VITALS — BP 150/98 | HR 79 | Ht 69.0 in | Wt 332.8 lb

## 2021-09-13 DIAGNOSIS — I10 Essential (primary) hypertension: Secondary | ICD-10-CM | POA: Diagnosis not present

## 2021-09-13 DIAGNOSIS — R600 Localized edema: Secondary | ICD-10-CM | POA: Diagnosis not present

## 2021-09-13 DIAGNOSIS — G4730 Sleep apnea, unspecified: Secondary | ICD-10-CM | POA: Diagnosis not present

## 2021-09-13 NOTE — Patient Instructions (Addendum)
Medication Instructions:  Your physician recommends that you continue on your current medications as directed. Please refer to the Current Medication list given to you today.   Labwork: None  Testing/Procedures: Lower Extremity US   Follow-Up: Follow up with Dr. Jenetta DownerNori Riis in 6 months.   Any Other Special Instructions Will Be Listed Below (If Applicable).  Continue to check bp daily   If you need a refill on your cardiac medications before your next appointment, please call your pharmacy.

## 2021-09-13 NOTE — Addendum Note (Signed)
Addended by: Berlinda Last on: 09/13/2021 01:28 PM   Modules accepted: Orders

## 2021-09-18 ENCOUNTER — Ambulatory Visit (HOSPITAL_COMMUNITY)
Admission: RE | Admit: 2021-09-18 | Discharge: 2021-09-18 | Disposition: A | Discharge status: Home / Self Care | Payer: 59 | Source: Ambulatory Visit | Attending: Cardiovascular Disease | Admitting: Cardiovascular Disease

## 2021-09-18 DIAGNOSIS — R600 Localized edema: Secondary | ICD-10-CM | POA: Insufficient documentation

## 2021-10-04 ENCOUNTER — Other Ambulatory Visit: Payer: Self-pay | Admitting: Cardiovascular Disease

## 2021-11-16 DIAGNOSIS — M1A09X1 Idiopathic chronic gout, multiple sites, with tophus (tophi): Secondary | ICD-10-CM | POA: Diagnosis not present

## 2022-03-14 ENCOUNTER — Ambulatory Visit: Payer: 59 | Admitting: Nurse Practitioner

## 2022-03-16 ENCOUNTER — Ambulatory Visit: Payer: 59 | Admitting: Nurse Practitioner

## 2022-04-02 ENCOUNTER — Encounter: Payer: Self-pay | Admitting: *Deleted

## 2022-04-19 DIAGNOSIS — E782 Mixed hyperlipidemia: Secondary | ICD-10-CM | POA: Diagnosis not present

## 2022-04-19 DIAGNOSIS — I10 Essential (primary) hypertension: Secondary | ICD-10-CM | POA: Diagnosis not present

## 2022-04-19 DIAGNOSIS — E1100 Type 2 diabetes mellitus with hyperosmolarity without nonketotic hyperglycemic-hyperosmolar coma (NKHHC): Secondary | ICD-10-CM | POA: Diagnosis not present

## 2022-04-25 ENCOUNTER — Encounter: Payer: Self-pay | Admitting: Internal Medicine

## 2022-04-25 DIAGNOSIS — I129 Hypertensive chronic kidney disease with stage 1 through stage 4 chronic kidney disease, or unspecified chronic kidney disease: Secondary | ICD-10-CM | POA: Diagnosis not present

## 2022-04-25 DIAGNOSIS — Z7984 Long term (current) use of oral hypoglycemic drugs: Secondary | ICD-10-CM | POA: Diagnosis not present

## 2022-04-25 DIAGNOSIS — R7401 Elevation of levels of liver transaminase levels: Secondary | ICD-10-CM | POA: Diagnosis not present

## 2022-04-25 DIAGNOSIS — R809 Proteinuria, unspecified: Secondary | ICD-10-CM | POA: Diagnosis not present

## 2022-04-25 DIAGNOSIS — E782 Mixed hyperlipidemia: Secondary | ICD-10-CM | POA: Diagnosis not present

## 2022-04-25 DIAGNOSIS — E1165 Type 2 diabetes mellitus with hyperglycemia: Secondary | ICD-10-CM | POA: Diagnosis not present

## 2022-04-25 DIAGNOSIS — M109 Gout, unspecified: Secondary | ICD-10-CM | POA: Diagnosis not present

## 2022-04-25 DIAGNOSIS — F329 Major depressive disorder, single episode, unspecified: Secondary | ICD-10-CM | POA: Diagnosis not present

## 2022-04-25 DIAGNOSIS — Z6841 Body Mass Index (BMI) 40.0 and over, adult: Secondary | ICD-10-CM | POA: Diagnosis not present

## 2022-04-25 DIAGNOSIS — Z7182 Exercise counseling: Secondary | ICD-10-CM | POA: Diagnosis not present

## 2022-04-25 DIAGNOSIS — N189 Chronic kidney disease, unspecified: Secondary | ICD-10-CM | POA: Diagnosis not present

## 2022-05-15 NOTE — Progress Notes (Unsigned)
GI Office Note    Referring Provider: Benita Stabile, MD Primary Care Physician:  Benita Stabile, MD  Primary Gastroenterologist:  Chief Complaint   No chief complaint on file.    History of Present Illness   Nicholas Turner is a 52 y.o. male presenting today to schedule colonoscopy.   Colonoscopy May 2019: -Many small and large mouth diverticula in the entire colon -Repeat colonoscopy in 5 years for surveillance, prior history of tubular adenoma    Medications   Current Outpatient Medications  Medication Sig Dispense Refill   allopurinol (ZYLOPRIM) 100 MG tablet Take 100-200 mg by mouth daily.     atorvastatin (LIPITOR) 40 MG tablet Take 1 tablet by mouth once daily 30 tablet 10   buPROPion (WELLBUTRIN XL) 150 MG 24 hr tablet Take 300 mg by mouth daily.     colchicine 0.6 MG tablet Take 0.6 mg by mouth daily.     furosemide (LASIX) 20 MG tablet Take 20 mg by mouth daily as needed for fluid.     hydrALAZINE (APRESOLINE) 25 MG tablet Take 50 mg by mouth in the morning and at bedtime.     metFORMIN (GLUCOPHAGE) 500 MG tablet Take 500 mg by mouth 2 (two) times daily.     metoprolol (LOPRESSOR) 100 MG tablet Take 1 tablet (100 mg total) by mouth 2 (two) times daily. 60 tablet 2   NIFEdipine (ADALAT CC) 30 MG 24 hr tablet Take 30 mg by mouth daily.     SOLIQUA 100-33 UNT-MCG/ML SOPN Inject 42 Units into the skin at bedtime.     telmisartan (MICARDIS) 80 MG tablet Take 80 mg by mouth daily.     No current facility-administered medications for this visit.    Allergies   Allergies as of 05/16/2022 - Review Complete 09/13/2021  Allergen Reaction Noted   Clonidine derivatives Other (See Comments) 05/10/2010   Oxycodone Nausea And Vomiting 09/01/2010    Past Medical History   Past Medical History:  Diagnosis Date   Arthritis    Chronic neck pain    Diverticulosis    DM (diabetes mellitus) (HCC) 09/01/2010       Fatty liver    Gastroparesis    Generalized  headaches    Gout 11/26/2011   Hyperlipidemia    Hypertension    Kidney stones    Knee pain    feet    Obesity (BMI 30-39.9) 09/01/2010   Palpitation    SLEEP APNEA 09/09/2009   Qualifier: Diagnosis of  By: Via LPN, Larita Fife     UTI (lower urinary tract infection)     Past Surgical History   Past Surgical History:  Procedure Laterality Date   COLONOSCOPY N/A 05/22/2017   Procedure: COLONOSCOPY;  Surgeon: Corbin Ade, MD;  Location: [facility];  Service: Endoscopy;  Laterality: N/A;  12:00   COLONOSCOPY WITH ESOPHAGOGASTRODUODENOSCOPY (EGD) N/A 05/01/2012   ZOX:WRUEAV esophagus. Small hiatal hernia. 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 pylori infection-status post biopsy (H.Pylori +). Minimally excoriated anal canal tissue. No hemorrhoids. Pancolonic diverticulosis. 8 mm polyp distal to the ICV, tubular adenoma. Next colonoscopy in April 2019.   ESOPHAGOGASTRODUODENOSCOPY ENDOSCOPY     KIDNEY STONE SURGERY     post ureteral stent and status post lithotripsy (10-15 yrs Ago)    Past Family History   Family History  Problem Relation Age of Onset   Arthritis Mother    Colon polyps Mother    Hypertension Mother  Heart attack Father    Arthritis Other    Heart disease Other    Hypertension Other    Sudden death Other    Arthritis Other    Heart disease Other    Hypertension Other    Alcohol abuse Other    Cancer Other    Asthma Other        breast cancer   Heart disease Other    Hypertension Other    Diabetes Other    Lung cancer Maternal Uncle    Breast cancer Maternal Aunt    Colon polyps Sister        age 34   Colon cancer Neg Hx     Past Social History   Social History   Socioeconomic History   Marital status: Married    Spouse name: Not on file   Number of children: 3   Years of education: Not on file   Highest education level: Not on file  Occupational History   Occupation: full time     Employer: [employer]     Comment: pest control  Tobacco Use   Smoking status: Never   Smokeless tobacco: Never   Tobacco comments:    Never smoker  Vaping Use   Vaping Use: Never used  Substance and Sexual Activity   Alcohol use: No   Drug use: No   Sexual activity: Not on file  Other Topics Concern   Not on file  Social History Narrative   Not on file   Social Determinants of Health   Financial Resource Strain: Not on file  Food Insecurity: Not on file  Transportation Needs: Not on file  Physical Activity: Not on file  Stress: Not on file  Social Connections: Not on file  Intimate Partner Violence: Not on file    Review of Systems   General: Negative for anorexia, weight loss, fever, chills, fatigue, weakness. Eyes: Negative for vision changes.  ENT: Negative for hoarseness, difficulty swallowing , nasal congestion. CV: Negative for chest pain, angina, palpitations, dyspnea on exertion, peripheral edema.  Respiratory: Negative for dyspnea at rest, dyspnea on exertion, cough, sputum, wheezing.  GI: See history of present illness. GU:  Negative for dysuria, hematuria, urinary incontinence, urinary frequency, nocturnal urination.  MS: Negative for joint pain, low back pain.  Derm: Negative for rash or itching.  Neuro: Negative for weakness, abnormal sensation, seizure, frequent headaches, memory loss,  confusion.  Psych: Negative for anxiety, depression, suicidal ideation, hallucinations.  Endo: Negative for unusual weight change.  Heme: Negative for bruising or bleeding. Allergy: Negative for rash or hives.  Physical Exam   There were no vitals taken for this visit.   General: Well-nourished, well-developed in no acute distress.  Head: Normocephalic, atraumatic.   Eyes: Conjunctiva pink, no icterus. Mouth: Oropharyngeal mucosa moist and pink , no lesions erythema or exudate. Neck: Supple without thyromegaly, masses, or lymphadenopathy.  Lungs: Clear to auscultation bilaterally.  Heart:  Regular rate and rhythm, no murmurs rubs or gallops.  Abdomen: Bowel sounds are normal, nontender, nondistended, no hepatosplenomegaly or masses,  no abdominal bruits or hernia, no rebound or guarding.   Rectal: *** Extremities: No lower extremity edema. No clubbing or deformities.  Neuro: Alert and oriented x 4 , grossly normal neurologically.  Skin: Warm and dry, no rash or jaundice.   Psych: Alert and cooperative, normal mood and affect.  Labs   *** Imaging Studies   No results found.  Assessment  PLAN   ***   Laureen Ochs. Bobby Rumpf, [place], [facility]

## 2022-05-15 NOTE — H&P (View-Only) (Signed)
GI Office Note    Referring Provider: Benita Stabile, MD Primary Care Physician:  Benita Stabile, MD  Primary Gastroenterologist: Roetta Sessions, MD   Chief Complaint   Chief Complaint  Patient presents with   Colonoscopy    Has issues with the diarrhea and bleeding every once in a while.     History of Present Illness   Nicholas Turner is a 52 y.o. male presenting today to schedule colonoscopy. He has personal and FH of colon polyps. His last colonoscopy was in 05/2017.   He reports intermittent loose stool and brbpr. Does not happen all the time. He denies abdominal pain or rectal pain. He believes the blood is coming from hemorrhoids. He notes some intermittent loose stool with greasy foods. Typically has BM 2-3 times per day. No dysphagia. Little nausea at times after meals, states he has gastroparesis. Some intermittent heartburn, takes something over the counter. Happens once or twice a month.   Reviewed labs from PCP. He has chronically elevated LFTs as outlined. Recommend updating labs, ultrasound.   Colonoscopy May 2019: -Many small and large mouth diverticula in the entire colon -Repeat colonoscopy in 5 years for surveillance, prior history of tubular adenoma  CT A/P with contrast 2015: normal liver   Medications   Current Outpatient Medications  Medication Sig Dispense Refill   allopurinol (ZYLOPRIM) 100 MG tablet Take 100-200 mg by mouth daily.     atorvastatin (LIPITOR) 40 MG tablet Take 1 tablet by mouth once daily 30 tablet 10   buPROPion (WELLBUTRIN XL) 150 MG 24 hr tablet Take 300 mg by mouth daily.     colchicine 0.6 MG tablet Take 0.6 mg by mouth daily.     furosemide (LASIX) 20 MG tablet Take 20 mg by mouth daily as needed for fluid.     hydrALAZINE (APRESOLINE) 25 MG tablet Take 50 mg by mouth in the morning and at bedtime.     JARDIANCE 25 MG TABS tablet Take 25 mg by mouth daily.     metFORMIN (GLUCOPHAGE) 500 MG tablet Take 500 mg by mouth 2 (two)  times daily.     metoprolol (LOPRESSOR) 100 MG tablet Take 1 tablet (100 mg total) by mouth 2 (two) times daily. 60 tablet 2   NIFEdipine (ADALAT CC) 30 MG 24 hr tablet Take 30 mg by mouth daily.     SOLIQUA 100-33 UNT-MCG/ML SOPN Inject 42 Units into the skin at bedtime.     telmisartan (MICARDIS) 80 MG tablet Take 80 mg by mouth daily.     No current facility-administered medications for this visit.    Allergies   Allergies as of 05/16/2022 - Review Complete 05/16/2022  Allergen Reaction Noted   Clonidine derivatives Other (See Comments) 05/10/2010   Oxycodone Nausea And Vomiting 09/01/2010    Past Medical History   Past Medical History:  Diagnosis Date   Arthritis    Chronic neck pain    Diverticulosis    DM (diabetes mellitus) 09/01/2010       Fatty liver    Gastroparesis    Generalized headaches    Gout 11/26/2011   Hyperlipidemia    Hypertension    Kidney stones    Knee pain    feet    Obesity (BMI 30-39.9) 09/01/2010   Palpitation    SLEEP APNEA 09/09/2009   Qualifier: Diagnosis of  By: Via LPN, Larita Fife     UTI (lower urinary tract infection)     Past  Surgical History   Past Surgical History:  Procedure Laterality Date   COLONOSCOPY N/A 05/22/2017   Procedure: COLONOSCOPY;  Surgeon: Corbin Ade, MD;  Location: [facility];  Service: Endoscopy;  Laterality: N/A;  12:00   COLONOSCOPY WITH ESOPHAGOGASTRODUODENOSCOPY (EGD) N/A 05/01/2012   ZOX:WRUEAV esophagus. Small hiatal hernia. 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 pylori infection-status post biopsy (H.Pylori +). Minimally excoriated anal canal tissue. No hemorrhoids. Pancolonic diverticulosis. 8 mm polyp distal to the ICV, tubular adenoma. Next colonoscopy in April 2019.   ESOPHAGOGASTRODUODENOSCOPY ENDOSCOPY     KIDNEY STONE SURGERY     post ureteral stent and status post lithotripsy (10-15 yrs Ago)    Past Family History   Family History  Problem Relation Age of Onset    Arthritis Mother    Colon polyps Mother    Hypertension Mother    Heart attack Father    Arthritis Other    Heart disease Other    Hypertension Other    Sudden death Other    Arthritis Other    Heart disease Other    Hypertension Other    Alcohol abuse Other    Cancer Other    Asthma Other        breast cancer   Heart disease Other    Hypertension Other    Diabetes Other    Lung cancer Maternal Uncle    Breast cancer Maternal Aunt    Colon polyps Sister        age 21   Colon cancer Neg Hx     Past Social History   Social History   Socioeconomic History   Marital status: Married    Spouse name: Not on file   Number of children: 3   Years of education: Not on file   Highest education level: Not on file  Occupational History   Occupation: full time     Employer: [employer]    Comment: pest control  Tobacco Use   Smoking status: Never   Smokeless tobacco: Never   Tobacco comments:    Never smoker  Vaping Use   Vaping Use: Never used  Substance and Sexual Activity   Alcohol use: No   Drug use: No   Sexual activity: Yes  Other Topics Concern   Not on file  Social History Narrative   Not on file   Social Determinants of Health   Financial Resource Strain: Not on file  Food Insecurity: Not on file  Transportation Needs: Not on file  Physical Activity: Not on file  Stress: Not on file  Social Connections: Not on file  Intimate Partner Violence: Not on file    Review of Systems   General: Negative for anorexia, weight loss, fever, chills, fatigue, weakness. Eyes: Negative for vision changes.  ENT: Negative for hoarseness, difficulty swallowing , nasal congestion. CV: Negative for chest pain, angina, palpitations, dyspnea on exertion, +peripheral edema.  Respiratory: Negative for dyspnea at rest, dyspnea on exertion, cough, sputum, wheezing.  GI: See history of present illness. GU:  Negative for dysuria, hematuria, urinary incontinence, urinary  frequency, nocturnal urination.  [id number] for joint pain, negative low back pain.  Derm: Negative for rash or itching.  Neuro: Negative for weakness, abnormal sensation, seizure, frequent headaches, memory loss,  confusion.  Psych: Negative for anxiety, depression, suicidal ideation, hallucinations.  Endo: Negative for unusual weight change.  Heme: Negative for bruising or bleeding. Allergy: Negative for rash or hives.  Physical Exam  BP (!) 167/106 (BP Location: Right Arm, Patient Position: Sitting, Cuff Size: Large)   Pulse 80   Temp 98.2 F (36.8 C) (Oral)   Ht 5\' 9"  (1.753 m)   Wt (!) 315 lb 6.4 oz (143.1 kg)   SpO2 96%   BMI 46.58 kg/m    General: Well-nourished, well-developed in no acute distress.  Head: Normocephalic, atraumatic.   Eyes: Conjunctiva pink, no icterus. Mouth: Oropharyngeal mucosa moist and pink , no lesions erythema or exudate. Neck: Supple without thyromegaly, masses, or lymphadenopathy.  Lungs: Clear to auscultation bilaterally.  Heart: Regular rate and rhythm, no murmurs rubs or gallops.  Abdomen: Bowel sounds are normal, nontender, nondistended, no hepatosplenomegaly or masses,  no abdominal bruits or hernia, no rebound or guarding.  Limited by body habitus Rectal: not performed Extremities: No lower extremity edema. No clubbing or deformities.  Neuro: Alert and oriented x 4 , grossly normal neurologically.  Skin: Warm and dry, no rash or jaundice.   Psych: Alert and cooperative, normal mood and affect.  Labs   Labs dated March 2024: White blood cell count 5400, hemoglobin 14.2, platelets 240,000, glucose 163, creatinine 1.15, albumin 4.5, total bilirubin 0.3, alkaline phosphatase 169, AST 37, ALT 49, A1c 6.9,  Imaging Studies   No results found.  Assessment   Personal history and family history of colon polyps: Due for surveillance colonoscopy at this time.  Rectal bleeding/loose stool: Intermittent loose stools related to certain  foods.  Does have solid stools as well.  Intermittent bright red blood per rectum, possibly benign anorectal source.  Differential diagnosis includes bleeding from hemorrhoids, less likely IBD or malignancy.  Elevated LFTs: Limited workup remotely, now with elevated alkaline phosphatase as well.  Recommend updating serologies, ultrasound.  Discussed fatty liver.  The patient was found to have elevated blood pressure when vital signs were checked in the office. The blood pressure was rechecked by provider and it was found be persistently elevated >140/90 mmHg. I personally advised to the patient to follow up closely with his PCP for hypertension control.   PLAN   Colonoscopy Dr. Jena Gauss.  ASA 3.  I have discussed the risks, alternatives, benefits with regards to but not limited to the risk of reaction to medication, bleeding, infection, perforation and the patient is agreeable to proceed. Written consent to be obtained. Update liver labs, abdominal ultrasound. Strive towards weight reduction, tight glycemic control, physical activity is much as tolerated.   Leanna Battles. Melvyn Neth, MHS, PA-C [facility]

## 2022-05-16 ENCOUNTER — Encounter: Payer: Self-pay | Admitting: Gastroenterology

## 2022-05-16 ENCOUNTER — Encounter: Payer: Self-pay | Admitting: *Deleted

## 2022-05-16 ENCOUNTER — Ambulatory Visit: Payer: 59 | Admitting: Gastroenterology

## 2022-05-16 VITALS — BP 167/106 | HR 80 | Temp 98.2°F | Ht 69.0 in | Wt 315.4 lb

## 2022-05-16 DIAGNOSIS — R7989 Other specified abnormal findings of blood chemistry: Secondary | ICD-10-CM

## 2022-05-16 DIAGNOSIS — K625 Hemorrhage of anus and rectum: Secondary | ICD-10-CM

## 2022-05-16 DIAGNOSIS — Z86010 Personal history of colonic polyps: Secondary | ICD-10-CM | POA: Diagnosis not present

## 2022-05-16 NOTE — Patient Instructions (Signed)
Colonoscopy to be scheduled. See separate instructions.  ?

## 2022-05-16 NOTE — Addendum Note (Signed)
Addended by: Tiffany Kocher on: 05/16/2022 09:10 AM   Modules accepted: Level of Service

## 2022-05-18 LAB — HEPATIC FUNCTION PANEL
ALT: 61 IU/L — ABNORMAL HIGH (ref 0–44)
AST: 49 IU/L — ABNORMAL HIGH (ref 0–40)
Albumin: 4.3 g/dL (ref 3.8–4.9)
Alkaline Phosphatase: 142 IU/L — ABNORMAL HIGH (ref 44–121)
Bilirubin Total: 0.3 mg/dL (ref 0.0–1.2)
Bilirubin, Direct: 0.14 mg/dL (ref 0.00–0.40)
Total Protein: 7.5 g/dL (ref 6.0–8.5)

## 2022-05-18 LAB — IRON,TIBC AND FERRITIN PANEL
Ferritin: 314 ng/mL (ref 30–400)
Iron Saturation: 20 % (ref 15–55)
Iron: 63 ug/dL (ref 38–169)
Total Iron Binding Capacity: 313 ug/dL (ref 250–450)
UIBC: 250 ug/dL (ref 111–343)

## 2022-05-18 LAB — HEPATITIS A ANTIBODY, TOTAL: hep A Total Ab: NEGATIVE

## 2022-05-18 LAB — MITOCHONDRIAL/SMOOTH MUSCLE AB PNL
Mitochondrial Ab: <20 Units (ref 0.0–20.0)
Smooth Muscle Ab: 5 Units (ref 0–19)

## 2022-05-18 LAB — HEPATITIS C ANTIBODY: Hep C Virus Ab: NONREACTIVE

## 2022-05-18 LAB — HEPATITIS B SURFACE ANTIBODY,QUALITATIVE: Hep B Surface Ab, Qual: NONREACTIVE

## 2022-05-18 LAB — HEPATITIS B SURFACE ANTIGEN: Hepatitis B Surface Ag: NEGATIVE

## 2022-05-18 LAB — TISSUE TRANSGLUTAMINASE, IGA: Transglutaminase IgA: <2 U/mL (ref 0–3)

## 2022-05-18 LAB — IGG, IGA, IGM
IgA/Immunoglobulin A, Serum: 211 mg/dL (ref 90–386)
IgG (Immunoglobin G), Serum: 1476 mg/dL (ref 603–1613)
IgM (Immunoglobulin M), Srm: 52 mg/dL (ref 20–172)

## 2022-05-18 LAB — ANA: Anti Nuclear Antibody (ANA): NEGATIVE

## 2022-05-31 NOTE — Patient Instructions (Signed)
Nicholas Turner  05/31/2022     @PREFPERIOPPHARMACY @   Your procedure is scheduled on  06/06/2022.   [facility] to [facility] at  1015  A.M.   Call this number if you have problems the morning of surgery:  [phone]  If you experience any cold or flu symptoms such as cough, fever, chills, shortness of breath, etc. between now and your scheduled surgery, please notify us at the above number.   Remember:  Follow the diet and prep instructions given to you by the office.         Your last dose of jardiance should be on 06/02/2022.          DO NOT take your soliqua the night before your procedure.        DO NOT take any medications for diabetes the morning of your procedure.     Take these medicines the morning of surgery with A SIP OF WATER                     allopurinol, wellbutrin, metoprolol, adalat.     Do not wear jewelry, make-up or nail polish.  Do not wear lotions, powders, or perfumes, or deodorant.  Do not shave 48 hours prior to surgery.  Men may shave face and neck.  Do not bring valuables to the hospital.  [facility] is not responsible for any belongings or valuables.  Contacts, dentures or bridgework may not be worn into surgery.  Leave your suitcase in the car.  After surgery it may be brought to your room.  For patients admitted to the hospital, discharge time will be determined by your treatment team.  Patients discharged the day of surgery will not be allowed to drive home and must have someone with them for 24 hours.    Special instructions:   DO NOT smoke tobacco or vape for 24 hours before your procedure.  Please read over the following fact sheets that you were given. Anesthesia Post-op Instructions and Care and Recovery After Surgery      Colonoscopy, Adult, Care After The following information offers guidance on how to care for yourself after your procedure. Your health care provider may also give you more specific instructions. If  you have problems or questions, contact your health care provider. What can I expect after the procedure? After the procedure, it is common to have: A small amount of blood in your stool for 24 hours after the procedure. Some gas. Mild cramping or bloating of your abdomen. Follow these instructions at home: Eating and drinking  Drink enough fluid to keep your urine pale yellow. Follow instructions from your health care provider about eating or drinking restrictions. Resume your normal diet as told by your health care provider. Avoid heavy or fried foods that are hard to digest. Activity Rest as told by your health care provider. Avoid sitting for a long time without moving. Get up to take short walks every 1-2 hours. This is important to improve blood flow and breathing. Ask for help if you feel weak or unsteady. Return to your normal activities as told by your health care provider. Ask your health care provider what activities are safe for you. Managing cramping and bloating  Try walking around when you have cramps or feel bloated. If directed, apply heat to your abdomen as told by your health care provider. Use the heat source that your health care provider recommends, such as  a moist heat pack or a heating pad. Place a towel between your skin and the heat source. Leave the heat on for 20-30 minutes. Remove the heat if your skin turns bright red. This is especially important if you are unable to feel pain, heat, or cold. You have a greater risk of getting burned. General instructions If you were given a sedative during the procedure, it can affect you for several hours. Do not drive or operate machinery until your health care provider says that it is safe. For the first 24 hours after the procedure: Do not sign important documents. Do not drink alcohol. Do your regular daily activities at a slower pace than normal. Eat soft foods that are easy to digest. Take over-the-counter and  prescription medicines only as told by your health care provider. Keep all follow-up visits. This is important. Contact a health care provider if: You have blood in your stool 2-3 days after the procedure. Get help right away if: You have more than a small spotting of blood in your stool. You have large blood clots in your stool. You have swelling of your abdomen. You have nausea or vomiting. You have a fever. You have increasing pain in your abdomen that is not relieved with medicine. These symptoms may be an emergency. Get help right away. Call 911. Do not wait to see if the symptoms will go away. Do not drive yourself to the hospital. Summary After the procedure, it is common to have a small amount of blood in your stool. You may also have mild cramping and bloating of your abdomen. If you were given a sedative during the procedure, it can affect you for several hours. Do not drive or operate machinery until your health care provider says that it is safe. Get help right away if you have a lot of blood in your stool, nausea or vomiting, a fever, or increased pain in your abdomen. This information is not intended to replace advice given to you by your health care provider. Make sure you discuss any questions you have with your health care provider. Document Revised: 08/31/2020 Document Reviewed: 08/31/2020 Elsevier Patient Education  2023 [employer] Monitored Anesthesia Care, Care After The following information offers guidance on how to care for yourself after your procedure. Your health care provider may also give you more specific instructions. If you have problems or questions, contact your health care provider. What can I expect after the procedure? After the procedure, it is common to have: Tiredness. Little or no memory about what happened during or after the procedure. Impaired judgment when it comes to making decisions. Nausea or vomiting. Some trouble with balance. Follow  these instructions at home: For the time period you were told by your health care provider:  Rest. Do not participate in activities where you could fall or become injured. Do not drive or use machinery. Do not drink alcohol. Do not take sleeping pills or medicines that cause drowsiness. Do not make important decisions or sign legal documents. Do not take care of children on your own. Medicines Take over-the-counter and prescription medicines only as told by your health care provider. If you were prescribed antibiotics, take them as told by your health care provider. Do not stop using the antibiotic even if you start to feel better. Eating and drinking Follow instructions from your health care provider about what you may eat and drink. Drink enough fluid to keep your urine pale yellow. If you vomit: Drink clear  fluids slowly and in small amounts as you are able. Clear fluids include water, ice chips, low-calorie sports drinks, and fruit juice that has water added to it (diluted fruit juice). Eat light and bland foods in small amounts as you are able. These foods include bananas, applesauce, rice, lean meats, toast, and crackers. General instructions  Have a responsible adult stay with you for the time you are told. It is important to have someone help care for you until you are awake and alert. If you have sleep apnea, surgery and some medicines can increase your risk for breathing problems. Follow instructions from your health care provider about wearing your sleep device: When you are sleeping. This includes during daytime naps. While taking prescription pain medicines, sleeping medicines, or medicines that make you drowsy. Do not use any products that contain nicotine or tobacco. These products include cigarettes, chewing tobacco, and vaping devices, such as e-cigarettes. If you need help quitting, ask your health care provider. Contact a health care provider if: You feel nauseous or  vomit every time you eat or drink. You feel light-headed. You are still sleepy or having trouble with balance after 24 hours. You get a rash. You have a fever. You have redness or swelling around the IV site. Get help right away if: You have trouble breathing. You have new confusion after you get home. These symptoms may be an emergency. Get help right away. Call 911. Do not wait to see if the symptoms will go away. Do not drive yourself to the hospital. This information is not intended to replace advice given to you by your health care provider. Make sure you discuss any questions you have with your health care provider. Document Revised: 06/05/2021 Document Reviewed: 06/05/2021 Elsevier Patient Education  2023 [employer].

## 2022-05-31 NOTE — Pre-Procedure Instructions (Signed)
Spoke with Dr Johnnette Litter concerning when to hold soliqua prior to procedure. He states to hold the night before only.

## 2022-06-01 ENCOUNTER — Encounter (HOSPITAL_COMMUNITY)
Admission: RE | Admit: 2022-06-01 | Discharge: 2022-06-01 | Disposition: A | Discharge status: Home / Self Care | Payer: 59 | Source: Ambulatory Visit | Attending: Internal Medicine | Admitting: Internal Medicine

## 2022-06-01 ENCOUNTER — Encounter (HOSPITAL_COMMUNITY): Payer: Self-pay

## 2022-06-01 VITALS — BP 142/88 | HR 78 | Temp 98.3°F | Resp 18 | Ht 69.0 in | Wt 315.4 lb

## 2022-06-01 DIAGNOSIS — Z01818 Encounter for other preprocedural examination: Secondary | ICD-10-CM | POA: Insufficient documentation

## 2022-06-01 DIAGNOSIS — E119 Type 2 diabetes mellitus without complications: Secondary | ICD-10-CM | POA: Diagnosis not present

## 2022-06-01 HISTORY — DX: Gastro-esophageal reflux disease without esophagitis: K21.9

## 2022-06-01 HISTORY — DX: Personal history of urinary calculi: Z87.442

## 2022-06-01 HISTORY — DX: Anxiety disorder, unspecified: F41.9

## 2022-06-01 HISTORY — DX: Depression, unspecified: F32.A

## 2022-06-01 LAB — BASIC METABOLIC PANEL
Anion gap: 10 (ref 5–15)
BUN: 14 mg/dL (ref 6–20)
CO2: 24 mmol/L (ref 22–32)
Calcium: 9.3 mg/dL (ref 8.9–10.3)
Chloride: 105 mmol/L (ref 98–111)
Creatinine, Ser: 1.3 mg/dL — ABNORMAL HIGH (ref 0.61–1.24)
GFR, Estimated: >60 mL/min (ref >60)
Glucose, Bld: 155 mg/dL — ABNORMAL HIGH (ref 70–99)
Potassium: 3.8 mmol/L (ref 3.5–5.1)
Sodium: 139 mmol/L (ref 135–145)

## 2022-06-04 ENCOUNTER — Other Ambulatory Visit: Payer: Self-pay | Admitting: *Deleted

## 2022-06-04 MED ORDER — PEG 3350-KCL-NA BICARB-NACL 420 G PO SOLR: 4000.0000 mL | Freq: Once | ORAL | 0 refills | Status: AC

## 2022-06-05 ENCOUNTER — Encounter: Payer: Self-pay | Admitting: *Deleted

## 2022-06-06 ENCOUNTER — Ambulatory Visit (HOSPITAL_COMMUNITY): Payer: 59 | Admitting: Anesthesiology

## 2022-06-06 ENCOUNTER — Ambulatory Visit (HOSPITAL_COMMUNITY)
Admission: RE | Admit: 2022-06-06 | Discharge: 2022-06-06 | Disposition: A | Discharge status: Home / Self Care | Payer: 59 | Attending: Internal Medicine | Admitting: Internal Medicine

## 2022-06-06 ENCOUNTER — Ambulatory Visit (HOSPITAL_BASED_OUTPATIENT_CLINIC_OR_DEPARTMENT_OTHER): Payer: 59 | Admitting: Anesthesiology

## 2022-06-06 ENCOUNTER — Encounter (HOSPITAL_COMMUNITY): Payer: Self-pay | Admitting: Internal Medicine

## 2022-06-06 ENCOUNTER — Encounter (HOSPITAL_COMMUNITY)
Admission: RE | Disposition: A | Discharge status: Home / Self Care | Payer: Self-pay | Source: Home / Self Care | Attending: Internal Medicine

## 2022-06-06 DIAGNOSIS — K5730 Diverticulosis of large intestine without perforation or abscess without bleeding: Secondary | ICD-10-CM | POA: Diagnosis not present

## 2022-06-06 DIAGNOSIS — K644 Residual hemorrhoidal skin tags: Secondary | ICD-10-CM | POA: Diagnosis not present

## 2022-06-06 DIAGNOSIS — G4730 Sleep apnea, unspecified: Secondary | ICD-10-CM | POA: Diagnosis not present

## 2022-06-06 DIAGNOSIS — Z6841 Body Mass Index (BMI) 40.0 and over, adult: Secondary | ICD-10-CM | POA: Diagnosis not present

## 2022-06-06 DIAGNOSIS — K760 Fatty (change of) liver, not elsewhere classified: Secondary | ICD-10-CM | POA: Diagnosis not present

## 2022-06-06 DIAGNOSIS — Z83719 Family history of colon polyps, unspecified: Secondary | ICD-10-CM | POA: Insufficient documentation

## 2022-06-06 DIAGNOSIS — F32A Depression, unspecified: Secondary | ICD-10-CM | POA: Insufficient documentation

## 2022-06-06 DIAGNOSIS — Z1211 Encounter for screening for malignant neoplasm of colon: Secondary | ICD-10-CM | POA: Diagnosis not present

## 2022-06-06 DIAGNOSIS — M109 Gout, unspecified: Secondary | ICD-10-CM | POA: Diagnosis not present

## 2022-06-06 DIAGNOSIS — I10 Essential (primary) hypertension: Secondary | ICD-10-CM | POA: Diagnosis not present

## 2022-06-06 DIAGNOSIS — Z7984 Long term (current) use of oral hypoglycemic drugs: Secondary | ICD-10-CM | POA: Diagnosis not present

## 2022-06-06 DIAGNOSIS — E119 Type 2 diabetes mellitus without complications: Secondary | ICD-10-CM | POA: Insufficient documentation

## 2022-06-06 DIAGNOSIS — K641 Second degree hemorrhoids: Secondary | ICD-10-CM | POA: Insufficient documentation

## 2022-06-06 DIAGNOSIS — F419 Anxiety disorder, unspecified: Secondary | ICD-10-CM | POA: Diagnosis not present

## 2022-06-06 DIAGNOSIS — Z86010 Personal history of colonic polyps: Secondary | ICD-10-CM | POA: Diagnosis not present

## 2022-06-06 DIAGNOSIS — M1990 Unspecified osteoarthritis, unspecified site: Secondary | ICD-10-CM | POA: Insufficient documentation

## 2022-06-06 DIAGNOSIS — K625 Hemorrhage of anus and rectum: Secondary | ICD-10-CM | POA: Insufficient documentation

## 2022-06-06 DIAGNOSIS — G4733 Obstructive sleep apnea (adult) (pediatric): Secondary | ICD-10-CM | POA: Diagnosis not present

## 2022-06-06 DIAGNOSIS — K219 Gastro-esophageal reflux disease without esophagitis: Secondary | ICD-10-CM | POA: Insufficient documentation

## 2022-06-06 DIAGNOSIS — R519 Headache, unspecified: Secondary | ICD-10-CM | POA: Insufficient documentation

## 2022-06-06 DIAGNOSIS — Z9989 Dependence on other enabling machines and devices: Secondary | ICD-10-CM | POA: Diagnosis not present

## 2022-06-06 DIAGNOSIS — K3184 Gastroparesis: Secondary | ICD-10-CM | POA: Diagnosis not present

## 2022-06-06 DIAGNOSIS — E1143 Type 2 diabetes mellitus with diabetic autonomic (poly)neuropathy: Secondary | ICD-10-CM | POA: Diagnosis not present

## 2022-06-06 HISTORY — PX: COLONOSCOPY WITH PROPOFOL: SHX5780

## 2022-06-06 LAB — GLUCOSE, CAPILLARY: Glucose-Capillary: 126 mg/dL — ABNORMAL HIGH (ref 70–99)

## 2022-06-06 SURGERY — COLONOSCOPY WITH PROPOFOL
Anesthesia: General

## 2022-06-06 MED ORDER — PROPOFOL 500 MG/50ML IV EMUL
INTRAVENOUS | Status: DC | PRN
  Administered 2022-06-06: 150 ug/kg/min via INTRAVENOUS

## 2022-06-06 MED ORDER — LACTATED RINGERS IV SOLN: INTRAVENOUS | Status: DC

## 2022-06-06 MED ORDER — PROPOFOL 500 MG/50ML IV EMUL
INTRAVENOUS | Status: AC
  Filled 2022-06-06: qty 50

## 2022-06-06 MED ORDER — PROPOFOL 10 MG/ML IV BOLUS
INTRAVENOUS | Status: DC | PRN
  Administered 2022-06-06: 150 mg via INTRAVENOUS

## 2022-06-06 NOTE — Anesthesia Preprocedure Evaluation (Signed)
Anesthesia Evaluation  Patient identified by MRN, date of birth, ID band Patient awake    Reviewed: Allergy & Precautions, NPO status , Patient's Chart, lab work & pertinent test results  Airway Mallampati: II  TM Distance: >3 FB Neck ROM: Full    Dental  (+) Dental Advisory Given No notable dental injury:   Pulmonary sleep apnea (severe) and Continuous Positive Airway Pressure Ventilation    Pulmonary exam normal breath sounds clear to auscultation       Cardiovascular hypertension, Pt. on medications Normal cardiovascular exam Rhythm:Regular Rate:Normal     Neuro/Psych  Headaches PSYCHIATRIC DISORDERS Anxiety Depression       GI/Hepatic Bowel prep,GERD  Medicated and Controlled,,  Endo/Other  diabetes, Well Controlled, Type 2, Oral Hypoglycemic Agents  Morbid obesity  Renal/GU Renal disease     Musculoskeletal  (+) Arthritis  (gout), Osteoarthritis,    Abdominal   Peds  Hematology   Anesthesia Other Findings   Reproductive/Obstetrics                             Anesthesia Physical Anesthesia Plan  ASA: 3  Anesthesia Plan: General   Post-op Pain Management: Minimal or no pain anticipated   Induction: Intravenous  PONV Risk Score and Plan: 1 and Propofol infusion  Airway Management Planned: Nasal Cannula, Natural Airway and Simple Face Mask  Additional Equipment:   Intra-op Plan:   Post-operative Plan:   Informed Consent: I have reviewed the patients History and Physical, chart, labs and discussed the procedure including the risks, benefits and alternatives for the proposed anesthesia with the patient or authorized representative who has indicated his/her understanding and acceptance.     Dental advisory given  Plan Discussed with: CRNA and Surgeon  Anesthesia Plan Comments:        Anesthesia Quick Evaluation

## 2022-06-06 NOTE — Op Note (Signed)
[facility] Patient Name: Nicholas Turner Procedure Date: 06/06/2022 10:26 AM MRN: [id number] Date of Birth: [birth year] Attending MD: Gennette Pac , MD, [id number] CSN: [id number] Age: 52 Admit Type: Outpatient Procedure:                Colonoscopy Indications:              Screening for colorectal malignant neoplasm Providers:                Gennette Pac, MD, Edrick Kins, RN, Pandora Leiter, Technician Referring MD:              Medicines:                Propofol per Anesthesia Complications:            No immediate complications. Estimated Blood Loss:     Estimated blood loss: none. Procedure:                Pre-Anesthesia Assessment:                           - Prior to the procedure, a History and Physical                            was performed, and patient medications and                            allergies were reviewed. The patient's tolerance of                            previous anesthesia was also reviewed. The risks                            and benefits of the procedure and the sedation                            options and risks were discussed with the patient.                            All questions were answered, and informed consent                            was obtained. Prior Anticoagulants: The patient has                            taken no anticoagulant or antiplatelet agents. ASA                            Grade Assessment: III - A patient with severe                            systemic disease. After reviewing the risks and  benefits, the patient was deemed in satisfactory                            condition to undergo the procedure.                           After obtaining informed consent, the colonoscope                            was passed under direct vision. Throughout the                            procedure, the patient's blood pressure, pulse, and                             oxygen saturations were monitored continuously. The                            [id number]) scope was introduced through the                            anus and advanced to the the cecum, identified by                            appendiceal orifice and ileocecal valve. The                            colonoscopy was performed without difficulty. The                            patient tolerated the procedure well. The quality                            of the bowel preparation was adequate. The                            ileocecal valve, appendiceal orifice, and rectum                            were photographed. Scope In: 10:39:31 AM Scope Out: 10:50:33 AM Scope Withdrawal Time: 0 hours 6 minutes 30 seconds  Total Procedure Duration: 0 hours 11 minutes 2 seconds  Findings:      Hemorrhoids were found on perianal exam.      Non-bleeding external and internal hemorrhoids were found during       retroflexion. The hemorrhoids were mild, small and Grade II (internal       hemorrhoids that prolapse but reduce spontaneously).      Scattered medium-mouthed diverticula were found in the entire colon.       Estimated blood loss: none.      The exam was otherwise without abnormality on direct and retroflexion       views. Impression:               - Hemorrhoids found on perianal exam.                           -  Non-bleeding external and internal hemorrhoids.                           - Diverticulosis in the entire examined colon.                           - The examination was otherwise normal on direct                            and retroflexion views.                           - No specimens collected. Moderate Sedation:      Moderate (conscious) sedation was personally administered by an       anesthesia professional. The following parameters were monitored: oxygen       saturation, heart rate, blood pressure, respiratory rate, EKG, adequacy       of pulmonary ventilation, and  response to care. Recommendation:           - Patient has a contact number available for                            emergencies. The signs and symptoms of potential                            delayed complications were discussed with the                            patient. Return to normal activities tomorrow.                            Written discharge instructions were provided to the                            patient.                           - Advance diet as tolerated.                           - Continue present medications.                           - Repeat colonoscopy in 7 years for surveillance.                           - Return to GI office (date not yet determined). Procedure Code(s):        --- Professional ---                           [phone], Colonoscopy, flexible; diagnostic, including                            collection of specimen(s) by brushing or washing,  when performed (separate procedure) Diagnosis Code(s):        --- Professional ---                           Z12.11, Encounter for screening for malignant                            neoplasm of colon                           K64.1, Second degree hemorrhoids                           K57.30, Diverticulosis of large intestine without                            perforation or abscess without bleeding CPT copyright 2022 American Medical Association. All rights reserved. The codes documented in this report are preliminary and upon coder review may  be revised to meet current compliance requirements. Gerrit Friends. Oneill Bais, MD Gennette Pac, MD 06/06/2022 11:09:14 AM This report has been signed electronically. Number of Addenda: 0

## 2022-06-06 NOTE — Anesthesia Postprocedure Evaluation (Signed)
Anesthesia Post Note  Patient: Nicholas Turner  Procedure(s) Performed: COLONOSCOPY WITH PROPOFOL  Patient location during evaluation: Phase II Anesthesia Type: General Level of consciousness: awake and alert and oriented Pain management: pain level controlled Vital Signs Assessment: post-procedure vital signs reviewed and stable Respiratory status: spontaneous breathing, nonlabored ventilation and respiratory function stable Cardiovascular status: blood pressure returned to baseline and stable Postop Assessment: no apparent nausea or vomiting Anesthetic complications: no  No notable events documented.   Last Vitals:  Vitals:   06/06/22 1054 06/06/22 1102  BP: (!) 95/47 108/71  Pulse: 65   Resp: 17   Temp: 36.6 C   SpO2: 95%     Last Pain:  Vitals:   06/06/22 1054  TempSrc: Axillary  PainSc: 0-No pain                 Myranda Pavone C Chase Knebel

## 2022-06-06 NOTE — Transfer of Care (Signed)
Immediate Anesthesia Transfer of Care Note  Patient: Nicholas Turner  Procedure(s) Performed: COLONOSCOPY WITH PROPOFOL  Patient Location: Short Stay  Anesthesia Type:General  Level of Consciousness: drowsy, patient cooperative, and responds to stimulation  Airway & Oxygen Therapy: Patient Spontanous Breathing  Post-op Assessment: Report given to RN, Post -op Vital signs reviewed and stable, Patient moving all extremities X 4, and Patient able to stick tongue midline  Post vital signs: Reviewed  Last Vitals:  Vitals Value Taken Time  BP 102/63   Temp 36.9   Pulse 62   Resp 15   SpO2 94     Last Pain:  Vitals:   06/06/22 1034  PainSc: 0-No pain         Complications: No notable events documented.

## 2022-06-06 NOTE — Interval H&P Note (Signed)
History and Physical Interval Note:  06/06/2022 10:30 AM  Nicholas Turner  has presented today for surgery, with the diagnosis of history of colon polyps,rectal bleeding.  The various methods of treatment have been discussed with the patient and family. After consideration of risks, benefits and other options for treatment, the patient has consented to  Procedure(s) with comments: COLONOSCOPY WITH PROPOFOL (N/A) - 12:30 pm, asa 3 as a surgical intervention.  The patient's history has been reviewed, patient examined, no change in status, stable for surgery.  I have reviewed the patient's chart and labs.  Questions were answered to the patient's satisfaction.     Nicholas Turner    No change.  Surveillance colonoscopy per plan.  The risks, benefits, limitations, alternatives and imponderables have been reviewed with the patient. Questions have been answered. All parties are agreeable.

## 2022-06-06 NOTE — Discharge Instructions (Addendum)
  Colonoscopy Discharge Instructions  Read the instructions outlined below and refer to this sheet in the next few weeks. These discharge instructions provide you with general information on caring for yourself after you leave the hospital. Your doctor may also give you specific instructions. While your treatment has been planned according to the most current medical practices available, unavoidable complications occasionally occur. If you have any problems or questions after discharge, call Dr. Jena Gauss at [phone]. ACTIVITY You may resume your regular activity, but move at a slower pace for the next 24 hours.  Take frequent rest periods for the next 24 hours.  Walking will help get rid of the air and reduce the bloated feeling in your belly (abdomen).  No driving for 24 hours (because of the medicine (anesthesia) used during the test).   Do not sign any important legal documents or operate any machinery for 24 hours (because of the anesthesia used during the test).  NUTRITION Drink plenty of fluids.  You may resume your normal diet as instructed by your doctor.  Begin with a light meal and progress to your normal diet. Heavy or fried foods are harder to digest and may make you feel sick to your stomach (nauseated).  Avoid alcoholic beverages for 24 hours or as instructed.  MEDICATIONS You may resume your normal medications unless your doctor tells you otherwise.  WHAT YOU CAN EXPECT TODAY Some feelings of bloating in the abdomen.  Passage of more gas than usual.  Spotting of blood in your stool or on the toilet paper.  IF YOU HAD POLYPS REMOVED DURING THE COLONOSCOPY: No aspirin products for 7 days or as instructed.  No alcohol for 7 days or as instructed.  Eat a soft diet for the next 24 hours.  FINDING OUT THE RESULTS OF YOUR TEST Not all test results are available during your visit. If your test results are not back during the visit, make an appointment with your caregiver to find out the  results. Do not assume everything is normal if you have not heard from your caregiver or the medical facility. It is important for you to follow up on all of your test results.  SEEK IMMEDIATE MEDICAL ATTENTION IF: You have more than a spotting of blood in your stool.  Your belly is swollen (abdominal distention).  You are nauseated or vomiting.  You have a temperature over 101.  You have abdominal pain or discomfort that is severe or gets worse throughout the day.       No polyps found today.  Diverticulosis present  Informational diverticulosis provided  Recommend your next colonoscopy be in 7 years from now.  At patient request, I called Tarsha  at [phone]-call rolled to voicemail.  I left a detailed message.

## 2022-06-12 ENCOUNTER — Encounter (HOSPITAL_COMMUNITY): Payer: Self-pay | Admitting: Internal Medicine

## 2022-06-21 ENCOUNTER — Ambulatory Visit (HOSPITAL_COMMUNITY)
Admission: RE | Admit: 2022-06-21 | Discharge: 2022-06-21 | Disposition: A | Discharge status: Home / Self Care | Payer: 59 | Source: Ambulatory Visit | Attending: Gastroenterology | Admitting: Gastroenterology

## 2022-06-21 DIAGNOSIS — R7989 Other specified abnormal findings of blood chemistry: Secondary | ICD-10-CM | POA: Diagnosis not present

## 2022-06-21 DIAGNOSIS — Z86010 Personal history of colonic polyps: Secondary | ICD-10-CM | POA: Diagnosis not present

## 2022-06-21 DIAGNOSIS — K625 Hemorrhage of anus and rectum: Secondary | ICD-10-CM | POA: Insufficient documentation

## 2022-06-21 DIAGNOSIS — N2889 Other specified disorders of kidney and ureter: Secondary | ICD-10-CM | POA: Diagnosis not present

## 2022-06-25 ENCOUNTER — Other Ambulatory Visit: Payer: Self-pay

## 2022-06-25 ENCOUNTER — Other Ambulatory Visit: Payer: Self-pay | Admitting: *Deleted

## 2022-06-25 DIAGNOSIS — N289 Disorder of kidney and ureter, unspecified: Secondary | ICD-10-CM

## 2022-06-25 DIAGNOSIS — R7989 Other specified abnormal findings of blood chemistry: Secondary | ICD-10-CM

## 2022-06-28 ENCOUNTER — Encounter: Payer: Self-pay | Admitting: *Deleted

## 2022-07-19 ENCOUNTER — Ambulatory Visit (HOSPITAL_COMMUNITY)
Admission: RE | Admit: 2022-07-19 | Discharge: 2022-07-19 | Disposition: A | Discharge status: Home / Self Care | Payer: 59 | Source: Ambulatory Visit | Attending: Gastroenterology | Admitting: Gastroenterology

## 2022-07-19 DIAGNOSIS — N2889 Other specified disorders of kidney and ureter: Secondary | ICD-10-CM | POA: Diagnosis not present

## 2022-07-19 DIAGNOSIS — N281 Cyst of kidney, acquired: Secondary | ICD-10-CM | POA: Diagnosis not present

## 2022-07-19 DIAGNOSIS — N289 Disorder of kidney and ureter, unspecified: Secondary | ICD-10-CM | POA: Diagnosis not present

## 2022-07-19 MED ORDER — GADOBUTROL 1 MMOL/ML IV SOLN
10.0000 mL | Freq: Once | INTRAVENOUS | Status: AC | PRN
  Administered 2022-07-19: 10 mL via INTRAVENOUS

## 2022-07-25 DIAGNOSIS — E1165 Type 2 diabetes mellitus with hyperglycemia: Secondary | ICD-10-CM | POA: Diagnosis not present

## 2022-07-29 ENCOUNTER — Other Ambulatory Visit: Payer: Self-pay | Admitting: Cardiovascular Disease

## 2022-08-01 DIAGNOSIS — E782 Mixed hyperlipidemia: Secondary | ICD-10-CM | POA: Diagnosis not present

## 2022-08-01 DIAGNOSIS — M109 Gout, unspecified: Secondary | ICD-10-CM | POA: Diagnosis not present

## 2022-08-01 DIAGNOSIS — R7401 Elevation of levels of liver transaminase levels: Secondary | ICD-10-CM | POA: Diagnosis not present

## 2022-08-01 DIAGNOSIS — E1165 Type 2 diabetes mellitus with hyperglycemia: Secondary | ICD-10-CM | POA: Diagnosis not present

## 2022-08-01 DIAGNOSIS — E1122 Type 2 diabetes mellitus with diabetic chronic kidney disease: Secondary | ICD-10-CM | POA: Diagnosis not present

## 2022-08-01 DIAGNOSIS — I129 Hypertensive chronic kidney disease with stage 1 through stage 4 chronic kidney disease, or unspecified chronic kidney disease: Secondary | ICD-10-CM | POA: Diagnosis not present

## 2022-08-01 DIAGNOSIS — F329 Major depressive disorder, single episode, unspecified: Secondary | ICD-10-CM | POA: Diagnosis not present

## 2022-08-01 DIAGNOSIS — R809 Proteinuria, unspecified: Secondary | ICD-10-CM | POA: Diagnosis not present

## 2022-08-01 DIAGNOSIS — N189 Chronic kidney disease, unspecified: Secondary | ICD-10-CM | POA: Diagnosis not present

## 2022-08-29 ENCOUNTER — Other Ambulatory Visit: Payer: Self-pay | Admitting: Cardiovascular Disease

## 2022-09-17 ENCOUNTER — Other Ambulatory Visit: Payer: Self-pay

## 2022-09-17 DIAGNOSIS — R7989 Other specified abnormal findings of blood chemistry: Secondary | ICD-10-CM

## 2022-09-27 ENCOUNTER — Other Ambulatory Visit: Payer: Self-pay | Admitting: Cardiovascular Disease

## 2022-09-27 DIAGNOSIS — R7989 Other specified abnormal findings of blood chemistry: Secondary | ICD-10-CM | POA: Diagnosis not present

## 2022-09-28 LAB — HEPATIC FUNCTION PANEL
ALT: 37 IU/L (ref 0–44)
AST: 40 IU/L (ref 0–40)
Albumin: 4.7 g/dL (ref 3.8–4.9)
Alkaline Phosphatase: 162 IU/L — ABNORMAL HIGH (ref 44–121)
Bilirubin Total: 0.4 mg/dL (ref 0.0–1.2)
Bilirubin, Direct: 0.11 mg/dL (ref 0.00–0.40)
Total Protein: 8 g/dL (ref 6.0–8.5)

## 2022-10-25 ENCOUNTER — Other Ambulatory Visit: Payer: Self-pay

## 2022-10-25 DIAGNOSIS — R7989 Other specified abnormal findings of blood chemistry: Secondary | ICD-10-CM

## 2022-11-23 ENCOUNTER — Other Ambulatory Visit: Payer: Self-pay

## 2022-11-23 DIAGNOSIS — R7989 Other specified abnormal findings of blood chemistry: Secondary | ICD-10-CM

## 2022-12-10 DIAGNOSIS — R7989 Other specified abnormal findings of blood chemistry: Secondary | ICD-10-CM | POA: Diagnosis not present

## 2022-12-12 DIAGNOSIS — G4733 Obstructive sleep apnea (adult) (pediatric): Secondary | ICD-10-CM | POA: Diagnosis not present

## 2022-12-15 LAB — HEPATIC FUNCTION PANEL
ALT: 30 [IU]/L (ref 0–44)
AST: 25 [IU]/L (ref 0–40)
Albumin: 4.3 g/dL (ref 3.8–4.9)
Alkaline Phosphatase: 135 [IU]/L — ABNORMAL HIGH (ref 44–121)
Bilirubin Total: 0.3 mg/dL (ref 0.0–1.2)
Bilirubin, Direct: 0.12 mg/dL (ref 0.00–0.40)
Total Protein: 7.4 g/dL (ref 6.0–8.5)

## 2022-12-15 LAB — ALKALINE PHOSPHATASE, ISOENZYMES
BONE FRACTION: 26 % (ref 12–68)
INTESTINAL FRAC.: 31 % — ABNORMAL HIGH (ref 0–18)
LIVER FRACTION: 43 % (ref 13–88)

## 2022-12-15 LAB — GAMMA GT: GGT: 40 [IU]/L (ref 0–65)

## 2023-01-03 ENCOUNTER — Encounter: Payer: Self-pay | Admitting: Internal Medicine

## 2023-01-09 ENCOUNTER — Other Ambulatory Visit: Payer: Self-pay | Admitting: *Deleted

## 2023-01-09 ENCOUNTER — Telehealth: Payer: Self-pay | Admitting: *Deleted

## 2023-01-09 DIAGNOSIS — N289 Disorder of kidney and ureter, unspecified: Secondary | ICD-10-CM

## 2023-01-09 NOTE — Telephone Encounter (Signed)
Pt left vm stating he received letter to call and schedule MRI.  Called [employer] for PA and spoke with Inocencio Homes.  PA approved for [facility] on [address], [place] Approval # [id number] DOS: 01/09/23-07/08/23.

## 2023-01-09 NOTE — Addendum Note (Signed)
Addended by: Elinor Dodge on: 01/09/2023 11:45 AM   Modules accepted: Orders

## 2023-01-09 NOTE — Telephone Encounter (Signed)
Spoke to [place] at [facility] in regards to scheduling pt for MRI. She states he is on there rotation and will be giving him a call today to get him scheduled.  Pt was made aware he will be receiving a phone call. Verbalized understanding.

## 2023-01-29 DIAGNOSIS — E1165 Type 2 diabetes mellitus with hyperglycemia: Secondary | ICD-10-CM | POA: Diagnosis not present

## 2023-01-31 ENCOUNTER — Ambulatory Visit
Admission: RE | Admit: 2023-01-31 | Discharge: 2023-01-31 | Disposition: A | Discharge status: Home / Self Care | Payer: 59 | Source: Ambulatory Visit | Attending: Gastroenterology | Admitting: Gastroenterology

## 2023-01-31 DIAGNOSIS — N289 Disorder of kidney and ureter, unspecified: Secondary | ICD-10-CM

## 2023-01-31 DIAGNOSIS — N281 Cyst of kidney, acquired: Secondary | ICD-10-CM | POA: Diagnosis not present

## 2023-01-31 MED ORDER — GADOPICLENOL 0.5 MMOL/ML IV SOLN
10.0000 mL | Freq: Once | INTRAVENOUS | Status: AC | PRN
  Administered 2023-01-31: 10 mL via INTRAVENOUS

## 2023-02-01 DIAGNOSIS — N281 Cyst of kidney, acquired: Secondary | ICD-10-CM | POA: Diagnosis not present

## 2023-02-01 DIAGNOSIS — E782 Mixed hyperlipidemia: Secondary | ICD-10-CM | POA: Diagnosis not present

## 2023-02-01 DIAGNOSIS — N189 Chronic kidney disease, unspecified: Secondary | ICD-10-CM | POA: Diagnosis not present

## 2023-02-01 DIAGNOSIS — I129 Hypertensive chronic kidney disease with stage 1 through stage 4 chronic kidney disease, or unspecified chronic kidney disease: Secondary | ICD-10-CM | POA: Diagnosis not present

## 2023-02-01 DIAGNOSIS — Z6841 Body Mass Index (BMI) 40.0 and over, adult: Secondary | ICD-10-CM | POA: Diagnosis not present

## 2023-02-01 DIAGNOSIS — E1165 Type 2 diabetes mellitus with hyperglycemia: Secondary | ICD-10-CM | POA: Diagnosis not present

## 2023-02-01 DIAGNOSIS — R7401 Elevation of levels of liver transaminase levels: Secondary | ICD-10-CM | POA: Diagnosis not present

## 2023-02-01 DIAGNOSIS — F329 Major depressive disorder, single episode, unspecified: Secondary | ICD-10-CM | POA: Diagnosis not present

## 2023-02-01 DIAGNOSIS — M109 Gout, unspecified: Secondary | ICD-10-CM | POA: Diagnosis not present

## 2023-02-01 DIAGNOSIS — R809 Proteinuria, unspecified: Secondary | ICD-10-CM | POA: Diagnosis not present

## 2023-02-14 NOTE — Progress Notes (Signed)
Cardiology Office Note    Date:  02/18/2023  ID:  Nicholas Turner, DOB Feb 06, 1970, MRN [id number] PCP:  Benita Stabile, MD  Cardiologist:  Reatha Harps, MD  Electrophysiologist:  None   Chief Complaint: f/u HTN  History of Present Illness: .    Nicholas Turner is a 53 y.o. male with visit-pertinent history of morbid obesity, HTN, LVH, severe OSA on CPAP, L>RLE edema (prior LE duplex negative for DVT), HLD seen for follow-up. Previously seen for HTN and LE edema with recommendation for antihypertensive therapy with suspicion of contribution of symptoms from morbid obesity and OSA. Last echo 01/2020 EF 60-65%, moderate LVH, G1DD, otherwise OK. He was previously unable to get bariatric surgery due to his insurance company.   He is seen for follow-up today with his wife doing well except has noticed episodic elevated BPs in the evenings, IE 140-150s systolic. Daytime rates 1teens-120s. Here BP 116/78 by CMA. They were surprised since BP is usually up here - subsequent value 132/98 recheck by me. He has lost 31lb since last visit here. He is exercising by walking 1 hour 5 days a week and making healthier choices. He reports he uses CPAP. He reports his machine dates back to at least 2012 or earlier, not currently being followed by anyone for this. He reports his LE edema has been well controlled lately, takes Lasix about every other day. Otherwise reports excellent adherence to med regimen.  Labwork independently reviewed: 01/29/23 CBC wnl, K 4.5, Cr 1.22, LFTs ok, alk phos 134, LDL 80, trig 160 11/2022 LFTs ok except perpetually elevated alk phos 05/2022 Cr 1.30, K 3.8 04/2022 iron studies ok 2023 Cr 1.39, BNP wnl 2014 LDL 114, trig 86  ROS: .    Please see the history of present illness.  All other systems are reviewed and otherwise negative.  Studies Reviewed: Marland Kitchen    EKG:  EKG is ordered today, personally reviewed, demonstrating NSR 69bpm, minimal voltage criteria for LVH, inferior Q  waves III, avF with TWI, also nonspecific TW flattening V3-V3 with TWI V5-V6. Unchanged from prior.  CV Studies: Cardiac studies reviewed are outlined and summarized above. Otherwise please see EMR for full report.   Current Reported Medications:.    Current Meds  Medication Sig   allopurinol (ZYLOPRIM) 300 MG tablet Take 300 mg by mouth daily.   atorvastatin (LIPITOR) 40 MG tablet Take 1 tablet by mouth once daily   buPROPion (WELLBUTRIN XL) 300 MG 24 hr tablet Take 300 mg by mouth daily.   colchicine 0.6 MG tablet Take 0.6 mg by mouth daily.   furosemide (LASIX) 20 MG tablet Take 20 mg by mouth daily as needed for fluid.   hydrALAZINE (APRESOLINE) 50 MG tablet Take 50 mg by mouth 3 (three) times daily.   JARDIANCE 25 MG TABS tablet Take 25 mg by mouth daily.   metFORMIN (GLUCOPHAGE) 500 MG tablet Take 500 mg by mouth daily with breakfast.   metoprolol (LOPRESSOR) 100 MG tablet Take 1 tablet (100 mg total) by mouth 2 (two) times daily.   NIFEdipine (ADALAT CC) 30 MG 24 hr tablet Take 30 mg by mouth daily.   SOLIQUA 100-33 UNT-MCG/ML SOPN Inject 42 Units into the skin at bedtime.   telmisartan (MICARDIS) 80 MG tablet Take 80 mg by mouth daily.    Physical Exam:    VS:  BP 116/78 (BP Location: Left Arm, Patient Position: Sitting, Cuff Size: Normal)   Pulse 70   Ht 5'  9" (1.753 m)   Wt (!) 301 lb 9.6 oz (136.8 kg)   SpO2 96%   BMI 44.54 kg/m    Wt Readings from Last 3 Encounters:  02/18/23 (!) 301 lb 9.6 oz (136.8 kg)  06/06/22 (!) 315 lb 4.1 oz (143 kg)  06/01/22 (!) 315 lb 6.4 oz (143.1 kg)    GEN: Well nourished, well developed in no acute distress NECK: No JVD; No carotid bruits CARDIAC: RRR, no murmurs, rubs, gallops RESPIRATORY:  Clear to auscultation without rales, wheezing or rhonchi  ABDOMEN: Soft, non-tender, non-distended EXTREMITIES:  No edema; No acute deformity   Asessement and Plan:.    1. Essential HTN - suboptimal BP noted on BP log particularly with PM  readings (uses arm cuff). Update BMET, TSH today to help guide next steps for med titration. He affirms adherence with the regimen above. We also discussed that his ongoing weight loss should also hopefully help over time as well. I congratulated him on his excellent progress. If he stalls I would recommend to refer for GLP1.  2. Left ventricular hypertrophy - continue BP modification, no signs of fluid overload on examination today. Suspect this is due to HTN. Would repeat echo if new volume issues arise but hold off since he is doing great otherwise.  3. Lower extremity edema - well controlled on present regimen.   4. Severe OSA - will refer to [facility] pulmonology to help manage this since his equipment is quite old. Helpful to optimize for heart/BP health.    Disposition: F/u with me in 6-8 weeks.  Signed, Laurann Montana, PA-C

## 2023-02-18 ENCOUNTER — Encounter: Payer: Self-pay | Admitting: Physician Assistant

## 2023-02-18 ENCOUNTER — Other Ambulatory Visit (HOSPITAL_COMMUNITY)
Admission: RE | Admit: 2023-02-18 | Discharge: 2023-02-18 | Disposition: A | Discharge status: Home / Self Care | Payer: 59 | Source: Ambulatory Visit | Attending: Physician Assistant | Admitting: Physician Assistant

## 2023-02-18 ENCOUNTER — Ambulatory Visit: Payer: 59 | Attending: Physician Assistant | Admitting: Physician Assistant

## 2023-02-18 VITALS — BP 132/98 | HR 70 | Ht 69.0 in | Wt 301.6 lb

## 2023-02-18 DIAGNOSIS — I10 Essential (primary) hypertension: Secondary | ICD-10-CM

## 2023-02-18 DIAGNOSIS — I517 Cardiomegaly: Secondary | ICD-10-CM | POA: Diagnosis not present

## 2023-02-18 DIAGNOSIS — G4733 Obstructive sleep apnea (adult) (pediatric): Secondary | ICD-10-CM | POA: Diagnosis not present

## 2023-02-18 DIAGNOSIS — R600 Localized edema: Secondary | ICD-10-CM | POA: Diagnosis not present

## 2023-02-18 LAB — BASIC METABOLIC PANEL
Anion gap: 10 (ref 5–15)
BUN: 18 mg/dL (ref 6–20)
CO2: 24 mmol/L (ref 22–32)
Calcium: 9.7 mg/dL (ref 8.9–10.3)
Chloride: 105 mmol/L (ref 98–111)
Creatinine, Ser: 1.29 mg/dL — ABNORMAL HIGH (ref 0.61–1.24)
GFR, Estimated: >60 mL/min (ref >60)
Glucose, Bld: 134 mg/dL — ABNORMAL HIGH (ref 70–99)
Potassium: 4.7 mmol/L (ref 3.5–5.1)
Sodium: 139 mmol/L (ref 135–145)

## 2023-02-18 LAB — TSH: TSH: 2.161 u[IU]/mL (ref 0.350–4.500)

## 2023-02-18 NOTE — Patient Instructions (Signed)
Medication Instructions:  Your physician recommends that you continue on your current medications as directed. Please refer to the Current Medication list given to you today.  *If you need a refill on your cardiac medications before your next appointment, please call your pharmacy*   Lab Work: Your physician recommends that you return for lab work in: Today   If you have labs (blood work) drawn today and your tests are completely normal, you will receive your results only by: MyChart Message (if you have MyChart) OR A paper copy in the mail If you have any lab test that is abnormal or we need to change your treatment, we will call you to review the results.   Testing/Procedures: NONE    Follow-Up: At [facility], you and your health needs are our priority.  As part of our continuing mission to provide you with exceptional heart care, we have created designated Provider Care Teams.  These Care Teams include your primary Cardiologist (physician) and Advanced Practice Providers (APPs -  Physician Assistants and Nurse Practitioners) who all work together to provide you with the care you need, when you need it.  We recommend signing up for the patient portal called "MyChart".  Sign up information is provided on this After Visit Summary.  MyChart is used to connect with patients for Virtual Visits (Telemedicine).  Patients are able to view lab/test results, encounter notes, upcoming appointments, etc.  Non-urgent messages can be sent to your provider as well.   To learn more about what you can do with MyChart, go to [email or web address].    Your next appointment:   6 -8 week(s)  Provider:    Ronie Spies, PA    Other Instructions Thank you for choosing [facility]!

## 2023-02-20 ENCOUNTER — Telehealth: Payer: Self-pay

## 2023-02-20 ENCOUNTER — Telehealth: Payer: Self-pay | Admitting: *Deleted

## 2023-02-20 DIAGNOSIS — Z79899 Other long term (current) drug therapy: Secondary | ICD-10-CM

## 2023-02-20 MED ORDER — OLMESARTAN MEDOXOMIL 40 MG PO TABS: 40.0000 mg | ORAL_TABLET | Freq: Every day | ORAL | 3 refills | Status: AC

## 2023-02-20 MED ORDER — OLMESARTAN MEDOXOMIL 40 MG PO TABS: 40.0000 mg | ORAL_TABLET | Freq: Every day | ORAL | 3 refills | Status: DC

## 2023-02-20 MED ORDER — METOPROLOL TARTRATE 100 MG PO TABS: 100.0000 mg | ORAL_TABLET | Freq: Two times a day (BID) | ORAL | 3 refills | Status: DC

## 2023-02-20 MED ORDER — TELMISARTAN 80 MG PO TABS: 80.0000 mg | ORAL_TABLET | Freq: Every day | ORAL | 1 refills | Status: DC

## 2023-02-20 MED ORDER — CARVEDILOL 25 MG PO TABS: 25.0000 mg | ORAL_TABLET | Freq: Two times a day (BID) | ORAL | 3 refills | Status: DC

## 2023-02-20 NOTE — Telephone Encounter (Signed)
-----   Message from Laurann Montana sent at 02/19/2023  8:48 AM EST ----- Please let patient know that labs are stable. Kidney function borderline abnormal consistent with mild chronic kidney disease. This is similar to prior values. Avoid meds in NSAID class (ie ibuprofen, Motrin, Aleve, naproxen). Let's first try switching his metoprolol to carvedilol 25mg  BID to see if this helps his PM blood pressure readings. He can try taking the PM dose a little earlier than bedtime (ie dinner time) to see how BP responds. Submit BP readings in 1 week for review.  Would also encourage him to check out the book series DASH diet as this also has some healthy ways to lower BP through nutrition while we work on med titration. Thank you!

## 2023-02-20 NOTE — Telephone Encounter (Signed)
   cancel plan for carvedilol continue metoprolol 100mg  BID continue telmisartan, hydralazine at present doses can you ask him if he ever had any issues with amlodipine? Was on it previously but someone else switched him to nifedipine. If no issues switch nifedipine to amlodipine 10mg  daily and take around lunch time - if he develops worsening edema on this regimen let us know  2 mins DD    Amlodipine gave patient swelling-CC  D.Dunn,PA-C will confer with PharmD and let us know.  let's go with the switch from telmisartan to olmesartan 40mg  at lunchtime. Keep nifedipine, keep metoprolol  1  CP DD Get BMET and CK level (to monitor all these crazy colchicine interactions) in 1 week, submit BP readings at that time   I spoke with pharmacist Brett Canales at [facility] and he has stopped coreg, resumed lopressor, stopped telmisartan and started olmesartan.  I spoke with patient and he will make the changes,get labs in 1 week and drop off BP log at that time

## 2023-02-20 NOTE — Telephone Encounter (Signed)
Pt notified and orders placed

## 2023-02-22 DIAGNOSIS — Z23 Encounter for immunization: Secondary | ICD-10-CM | POA: Diagnosis not present

## 2023-02-28 ENCOUNTER — Other Ambulatory Visit (HOSPITAL_COMMUNITY)
Admission: RE | Admit: 2023-02-28 | Discharge: 2023-02-28 | Disposition: A | Discharge status: Home / Self Care | Payer: 59 | Source: Ambulatory Visit | Attending: Physician Assistant | Admitting: Physician Assistant

## 2023-02-28 ENCOUNTER — Encounter: Payer: Self-pay | Admitting: Physician Assistant

## 2023-02-28 ENCOUNTER — Telehealth: Payer: Self-pay

## 2023-02-28 DIAGNOSIS — Z79899 Other long term (current) drug therapy: Secondary | ICD-10-CM | POA: Insufficient documentation

## 2023-02-28 DIAGNOSIS — N1831 Chronic kidney disease, stage 3a: Secondary | ICD-10-CM

## 2023-02-28 LAB — BASIC METABOLIC PANEL
Anion gap: 10 (ref 5–15)
BUN: 15 mg/dL (ref 6–20)
CO2: 24 mmol/L (ref 22–32)
Calcium: 9.5 mg/dL (ref 8.9–10.3)
Chloride: 106 mmol/L (ref 98–111)
Creatinine, Ser: 1.43 mg/dL — ABNORMAL HIGH (ref 0.61–1.24)
GFR, Estimated: 59 mL/min — ABNORMAL LOW (ref >60)
Glucose, Bld: 139 mg/dL — ABNORMAL HIGH (ref 70–99)
Potassium: 4 mmol/L (ref 3.5–5.1)
Sodium: 140 mmol/L (ref 135–145)

## 2023-02-28 LAB — CK: Total CK: 179 U/L (ref 49–397)

## 2023-02-28 MED ORDER — HYDRALAZINE HCL 100 MG PO TABS: 100.0000 mg | ORAL_TABLET | Freq: Three times a day (TID) | ORAL | 3 refills | Status: DC

## 2023-02-28 NOTE — Telephone Encounter (Signed)
-----   Message from Dayna N Dunn sent at 02/28/2023 10:59 AM EST ----- Labs look generally stable with chronic appearing kidney disease. BP log reviewed; BPs still not completely at goal with swap to olmesartan . As per previous Phone Note, his required use of colchicine  makes medication management somewhat challenging.   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

## 2023-02-28 NOTE — Telephone Encounter (Signed)
 The patient has been notified of the result and verbalized understanding.  All questions (if any) were answered. Bernett Dorothyann LABOR, RN 02/28/2023 11:32 AM     Patient will increase hydralazine  to 100 mg TID and keep BP log for the next week   Ref placed to nephrology Dr.Singh

## 2023-03-30 NOTE — Progress Notes (Unsigned)
 GI Office Note    Referring Provider: Benita Stabile, MD Primary Care Physician:  Benita Stabile, MD  Primary Gastroenterologist: Roetta Sessions, MD   Chief Complaint   No chief complaint on file.   History of Present Illness   Nicholas Turner is a 53 y.o. male presenting today for follow up. Last seen 04/2022. H/o chronically elevated LFTs.   Labs 11/2022: LFTs normal except alk phos of 135. Intestinal fraction elevated at 31%. Liver normal at 43, bone normal at 26. GGT 40, normal.   Labs in 04/2022: iron labs normal, celiac serologies neg, Hep B and C neg. Not immune to Hep A or B, autoimmune markers negative. Immunoglobulins normal. AP 142, AST 49, ALT 61, Tbili 0.3  MRI Abd with and without contrast 01/2023: IMPRESSION: Stable benign Bosniak category 1 and 2 bilateral renal cysts (No followup imaging is recommended). No evidence of renal neoplasm or hydronephrosis.  Abd u/s 05/2022: IMPRESSION: 1. The study is limited due to patient body habitus and bowel gas shadowing. 2. Diffuse increased echogenicity throughout the liver is nonspecific and may be seen with hepatic steatosis or other underlying intrinsic liver disease. 3. The 2 cm hypoechoic mass in the left kidney is favored to represent a complicated cyst. An MRI of the abdomen with and without contrast could definitively characterize. If MRI is not pursued, six-month follow-up ultrasound to ensure stability would be recommended. 4. No other abnormalities.    Colonoscopy 05/2022: -hemorhrohids found on perianal exam -nonbleeding external and internal hemorrhoids, mild, small and Grade II -diverticulosis entire examined colon -repeat colonoscopy 7 years  Colonoscopy May 2019: -Many small and large mouth diverticula in the entire colon -Repeat colonoscopy in 5 years for surveillance, prior history of tubular adenoma   CT A/P with contrast 2015: normal liver       Medications   Current Outpatient  Medications  Medication Sig Dispense Refill   allopurinol (ZYLOPRIM) 300 MG tablet Take 300 mg by mouth daily.     atorvastatin (LIPITOR) 40 MG tablet Take 1 tablet by mouth once daily 15 tablet 0   buPROPion (WELLBUTRIN XL) 300 MG 24 hr tablet Take 300 mg by mouth daily.     colchicine 0.6 MG tablet Take 0.6 mg by mouth daily.     furosemide (LASIX) 20 MG tablet Take 20 mg by mouth daily as needed for fluid.     hydrALAZINE (APRESOLINE) 100 MG tablet Take 1 tablet (100 mg total) by mouth 3 (three) times daily. 270 tablet 3   ibuprofen (ADVIL) 200 MG tablet Take 400 mg by mouth every 6 (six) hours as needed for moderate pain. (Patient not taking: Reported on 02/18/2023)     JARDIANCE 25 MG TABS tablet Take 25 mg by mouth daily.     metFORMIN (GLUCOPHAGE) 500 MG tablet Take 500 mg by mouth daily with breakfast.     metoprolol tartrate (LOPRESSOR) 100 MG tablet Take 1 tablet (100 mg total) by mouth 2 (two) times daily. 180 tablet 3   NIFEdipine (ADALAT CC) 30 MG 24 hr tablet Take 30 mg by mouth daily.     olmesartan (BENICAR) 40 MG tablet Take 1 tablet (40 mg total) by mouth daily with lunch. 90 tablet 3   SOLIQUA 100-33 UNT-MCG/ML SOPN Inject 42 Units into the skin at bedtime.     No current facility-administered medications for this visit.    Allergies   Allergies as of 04/01/2023 - Review Complete 02/18/2023  Allergen  Reaction Noted   Clonidine derivatives Other (See Comments) 05/10/2010   Oxycodone Nausea And Vomiting 09/01/2010     Past Medical History   Past Medical History:  Diagnosis Date   Anxiety    Arthritis    Chronic neck pain    Depression    Diverticulosis    DM (diabetes mellitus) (HCC) 09/01/2010       Fatty liver    Gastroparesis    Generalized headaches    GERD (gastroesophageal reflux disease)    Gout 11/26/2011   History of kidney stones    Hyperlipidemia    Hypertension    Knee pain    feet    Obesity (BMI 30-39.9) 09/01/2010   Palpitation    SLEEP  APNEA 09/09/2009   Qualifier: Diagnosis of  By: Via LPN, Larita Fife     UTI (lower urinary tract infection)     Past Surgical History   Past Surgical History:  Procedure Laterality Date   COLONOSCOPY N/A 05/22/2017   Procedure: COLONOSCOPY;  Surgeon: Corbin Ade, MD;  Location: [facility];  Service: Endoscopy;  Laterality: N/A;  12:00   COLONOSCOPY WITH ESOPHAGOGASTRODUODENOSCOPY (EGD) N/A 05/01/2012   [id number] esophagus. Small hiatal hernia. 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 pylori infection-status post biopsy (H.Pylori +). Minimally excoriated anal canal tissue. No hemorrhoids. Pancolonic diverticulosis. 8 mm polyp distal to the ICV, tubular adenoma. Next colonoscopy in April 2019.   COLONOSCOPY WITH PROPOFOL N/A 06/06/2022   Procedure: COLONOSCOPY WITH PROPOFOL;  Surgeon: Corbin Ade, MD;  Location: [facility];  Service: Endoscopy;  Laterality: N/A;  12:30 pm, asa 3   ESOPHAGOGASTRODUODENOSCOPY ENDOSCOPY     KIDNEY STONE SURGERY     post ureteral stent and status post lithotripsy (10-15 yrs Ago)    Past Family History   Family History  Problem Relation Age of Onset   Arthritis Mother    Colon polyps Mother    Hypertension Mother    Heart attack Father    Arthritis Other    Heart disease Other    Hypertension Other    Sudden death Other    Arthritis Other    Heart disease Other    Hypertension Other    Alcohol abuse Other    Cancer Other    Asthma Other        breast cancer   Heart disease Other    Hypertension Other    Diabetes Other    Lung cancer Maternal Uncle    Breast cancer Maternal Aunt    Colon polyps Sister        age 41   Colon cancer Neg Hx     Past Social History   Social History   Socioeconomic History   Marital status: Married    Spouse name: Not on file   Number of children: 3   Years of education: Not on file   Highest education level: Not on file  Occupational History   Occupation: full time      Employer: [employer]    Comment: pest control  Tobacco Use   Smoking status: Never   Smokeless tobacco: Never   Tobacco comments:    Never smoker  Vaping Use   Vaping status: Never Used  Substance and Sexual Activity   Alcohol use: No   Drug use: No   Sexual activity: Yes  Other Topics Concern   Not on file  Social History Narrative   Not on file  Social Drivers of Corporate investment banker Strain: Not on file  Food Insecurity: Not on file  Transportation Needs: Not on file  Physical Activity: Not on file  Stress: Not on file  Social Connections: Not on file  Intimate Partner Violence: Not on file    Review of Systems   General: Negative for anorexia, weight loss, fever, chills, fatigue, weakness. ENT: Negative for hoarseness, difficulty swallowing , nasal congestion. CV: Negative for chest pain, angina, palpitations, dyspnea on exertion, peripheral edema.  Respiratory: Negative for dyspnea at rest, dyspnea on exertion, cough, sputum, wheezing.  GI: See history of present illness. GU:  Negative for dysuria, hematuria, urinary incontinence, urinary frequency, nocturnal urination.  Endo: Negative for unusual weight change.     Physical Exam   There were no vitals taken for this visit.   General: Well-nourished, well-developed in no acute distress.  Eyes: No icterus. Mouth: Oropharyngeal mucosa moist and pink , no lesions erythema or exudate. Lungs: Clear to auscultation bilaterally.  Heart: Regular rate and rhythm, no murmurs rubs or gallops.  Abdomen: Bowel sounds are normal, nontender, nondistended, no hepatosplenomegaly or masses,  no abdominal bruits or hernia , no rebound or guarding.  Rectal: ***  Extremities: No lower extremity edema. No clubbing or deformities. Neuro: Alert and oriented x 4   Skin: Warm and dry, no jaundice.   Psych: Alert and cooperative, normal mood and affect.  Labs   *** Imaging Studies   No results found.  Assessment        PLAN   ***   Leanna Battles. Melvyn Neth, MHS, PA-C [facility]

## 2023-04-01 ENCOUNTER — Ambulatory Visit: Payer: 59 | Admitting: Gastroenterology

## 2023-04-01 ENCOUNTER — Encounter: Payer: Self-pay | Admitting: Gastroenterology

## 2023-04-01 VITALS — BP 132/83 | HR 68 | Temp 97.9°F | Ht 69.0 in | Wt 301.2 lb

## 2023-04-01 DIAGNOSIS — R7989 Other specified abnormal findings of blood chemistry: Secondary | ICD-10-CM

## 2023-04-01 DIAGNOSIS — R748 Abnormal levels of other serum enzymes: Secondary | ICD-10-CM

## 2023-04-01 DIAGNOSIS — Z8619 Personal history of other infectious and parasitic diseases: Secondary | ICD-10-CM | POA: Diagnosis not present

## 2023-04-01 NOTE — Patient Instructions (Signed)
 Complete stool and lab test at your convenience. Please complete blood test fasting, nothing to eat/drink after midnight. Continue to work at weight reduction and increase physical activity. Great job at making changes! We will plan office visit in one year unless you have change in your blood work or new symptoms.

## 2023-04-10 NOTE — Progress Notes (Signed)
 Cardiology Office Note    Date:  04/15/2023  ID:  Nicholas Turner, DOB [birth year], MRN [id number] PCP:  Benita Stabile, MD  Cardiologist:  Reatha Harps, MD  Electrophysiologist:  None   Chief Complaint: f/u HTN  History of Present Illness: .    Nicholas Turner is a 53 y.o. 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   He was previously seen by Dr. Flora Lipps for HTN and LE edema with recommendation for antihypertensive therapy with suspicion of contribution of symptoms from morbid obesity and OSA. Renal artery duplex 2021/05/11 negative for RAS. Echo 01/2020 showed EF 60-65%, moderate LVH, G1DD, otherwise OK. He was previously unable to get bariatric surgery due to his insurance company. I met him in 01/2023 for blood pressure follow-up. We were unable to transition metoprolol to carvedilol due to confirmed drug interaction with chronic colchicine (requires chronic use due to severe gout) so we increased hydralazine instead and changed telmisartan to olmesartan. He was also referred to nephrology for CKD stage 3a (not yet heard back yet) and back to pulm for optimization of OSA given old equipment (has upcoming OV). MRA 01/2023 done by GI following stable appearing renal cysts did not pick up any adrenal tumors. He was diagnosed with HTN in the 1990s in his mid 28s. His dad had HTN as well and ultimately needed dialysis. No specific family hx of cardiomyopathy.  He is seen back today for follow-up with his wife. He is down another 4lb from last visit, continues to work on lifestyle modifications. He also elected to cut out sugar for lent. He denies any CP or SOB. He did start taking Lasix a little more frequently since last visit for sporadic LE edema (pretty much every day this week) and has noticed this is when his  blood pressure really began to improve. SBP 114/80 by CMA, 122/82 by me.   Labwork independently reviewed: 02/2023 K 4.0, CR 1.43, CK OK 01/2023 TSH OK, CBC wnl, K 4.5, Cr 1.22, LFTs ok, alk phos 134, LDL 80, trig 160 11/2022 LFTs ok except perpetually elevated alk phos 05/2022 Cr 1.30, K 3.8 04/2022 iron studies ok 11-May-2021 Cr 1.39, BNP wnl 05-12-2014 neg protein 2014 LDL 114, trig 86  ROS: .    Please see the history of present illness.  All other systems are reviewed and otherwise negative.  Studies Reviewed: Marland Kitchen    EKG:  EKG is not ordered today  CV Studies: Cardiac studies reviewed are outlined and summarized above. Otherwise please see EMR for full report.   Current Reported Medications:.    Current Meds  Medication Sig   allopurinol (ZYLOPRIM) 300 MG tablet Take 300 mg by mouth daily.   atorvastatin (LIPITOR) 40 MG tablet Take 1 tablet by mouth once daily   buPROPion (WELLBUTRIN XL) 300 MG 24 hr tablet Take 300 mg by mouth daily.   colchicine 0.6 MG tablet Take 0.6 mg by mouth daily.   furosemide (LASIX) 20 MG tablet Take 20 mg by mouth daily as needed for fluid.   hydrALAZINE (APRESOLINE) 100 MG tablet Take 1 tablet (100 mg total) by mouth 3 (three) times daily.   ibuprofen (ADVIL) 200 MG tablet Take 400 mg by mouth every 6 (six) hours as needed for moderate pain (pain score 4-6).   JARDIANCE  25 MG TABS tablet Take 25 mg by mouth daily.   metFORMIN (GLUCOPHAGE) 500 MG tablet Take 500 mg by mouth daily with breakfast.   metoprolol tartrate (LOPRESSOR) 100 MG tablet Take 1 tablet (100 mg total) by mouth 2 (two) times daily.   NIFEdipine (ADALAT CC) 30 MG 24 hr tablet Take 30 mg by mouth daily.   olmesartan (BENICAR) 40 MG tablet Take 1 tablet (40 mg total) by mouth daily with lunch.   SOLIQUA 100-33 UNT-MCG/ML SOPN Inject 42 Units into the skin at bedtime.    Physical Exam:    VS:  BP 114/80 (BP Location: Left Arm, Patient Position: Sitting, Cuff Size: Large)   Pulse 66   Ht 5'  9" (1.753 m)   Wt 297 lb (134.7 kg)   SpO2 95%   BMI 43.86 kg/m    Wt Readings from Last 3 Encounters:  04/15/23 297 lb (134.7 kg)  04/01/23 (!) 301 lb 3.2 oz (136.6 kg)  02/18/23 (!) 301 lb 9.6 oz (136.8 kg)    GEN: Well nourished, well developed in no acute distress NECK: No JVD; No carotid bruits CARDIAC: RRR, no murmurs, rubs, gallops RESPIRATORY:  Clear to auscultation without rales, wheezing or rhonchi  ABDOMEN: Soft, non-tender, non-distended EXTREMITIES:  No edema; No acute deformity   Asessement and Plan:.    1. 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 He had a prior renal artery duplex negative for renal artery stenosis. MRA did not show any adrenal tumors. TSH has been normal. Will pursue baseline UA (assessing for proteinuria) as well as urine metanephrines/catecholamines. When this is back will plan to d/w Dr. Flora Lipps whether there is utility in renin/aldo assessment though noting that he is already on antihypertensive therapy that can skew results. Continue to optimize therapy for OSA and lifestyle modification as he is doing. Otherwise continue present regimen - need to run any future med changes through an interaction checker given the many drug/drug interactions with colchicine. Recent CK was stable as well.  2. H/o LVH, lower extremity edema - he has started to take Lasix a little more frequently. Appears euvolemic today. Will update echocardiogram to ensure LV still stable.  LVH was previously suspected due to uncontrolled HTN but will plan to discuss with Dr. Flora Lipps.  3. Severe OSA - thankfully has pulmonology follow-up to help ensure his equipment and settings are up to date.  4. CKD stage 2-3a - I have asked Isabelle Course with our team to check into the nephrology referral as he had not heard back from them yet. We advised to avoid NSAIDS at last visit.   5. HLD - it looks like lipids are being followed by  Dr. Margo Aye now. I would recommend further refills be obtained through his office going forward. Note interaction with colchicine and atorvastatin, neither at max dose. Recent CK normal.    Disposition: F/u with Dr. Wyline Mood to establish care in 6 months (prefers to follow here in [place]).  Signed, Laurann Montana, PA-C

## 2023-04-15 ENCOUNTER — Ambulatory Visit: Payer: 59 | Attending: Physician Assistant | Admitting: Physician Assistant

## 2023-04-15 ENCOUNTER — Other Ambulatory Visit (HOSPITAL_COMMUNITY)
Admission: RE | Admit: 2023-04-15 | Discharge: 2023-04-15 | Disposition: A | Discharge status: Home / Self Care | Source: Ambulatory Visit | Attending: Physician Assistant | Admitting: Physician Assistant

## 2023-04-15 ENCOUNTER — Encounter: Payer: Self-pay | Admitting: Physician Assistant

## 2023-04-15 ENCOUNTER — Other Ambulatory Visit: Payer: Self-pay

## 2023-04-15 VITALS — BP 114/80 | HR 66 | Ht 69.0 in | Wt 297.0 lb

## 2023-04-15 DIAGNOSIS — N1831 Chronic kidney disease, stage 3a: Secondary | ICD-10-CM

## 2023-04-15 DIAGNOSIS — E785 Hyperlipidemia, unspecified: Secondary | ICD-10-CM | POA: Diagnosis not present

## 2023-04-15 DIAGNOSIS — I1A0 Resistant hypertension: Secondary | ICD-10-CM | POA: Diagnosis not present

## 2023-04-15 DIAGNOSIS — I517 Cardiomegaly: Secondary | ICD-10-CM

## 2023-04-15 DIAGNOSIS — R600 Localized edema: Secondary | ICD-10-CM | POA: Insufficient documentation

## 2023-04-15 DIAGNOSIS — G4733 Obstructive sleep apnea (adult) (pediatric): Secondary | ICD-10-CM | POA: Diagnosis not present

## 2023-04-15 DIAGNOSIS — I10 Essential (primary) hypertension: Secondary | ICD-10-CM

## 2023-04-15 LAB — BASIC METABOLIC PANEL
Anion gap: 11 (ref 5–15)
BUN: 25 mg/dL — ABNORMAL HIGH (ref 6–20)
CO2: 22 mmol/L (ref 22–32)
Calcium: 9.8 mg/dL (ref 8.9–10.3)
Chloride: 105 mmol/L (ref 98–111)
Creatinine, Ser: 1.49 mg/dL — ABNORMAL HIGH (ref 0.61–1.24)
GFR, Estimated: 55 mL/min — ABNORMAL LOW (ref >60)
Glucose, Bld: 119 mg/dL — ABNORMAL HIGH (ref 70–99)
Potassium: 4.5 mmol/L (ref 3.5–5.1)
Sodium: 138 mmol/L (ref 135–145)

## 2023-04-15 NOTE — Patient Instructions (Addendum)
 Medication Instructions:  Your physician recommends that you continue on your current medications as directed. Please refer to the Current Medication list given to you today.  Labwork: BMET UA, METANEPHRINES & CATECHOLEMINES URINE 24 HOUR All labs can be done at [facility]  Testing/Procedures: Your physician has requested that you have an echocardiogram. Echocardiography is a painless test that uses sound waves to create images of your heart. It provides your doctor with information about the size and shape of your heart and how well your heart's chambers and valves are working. This procedure takes approximately one hour. There are no restrictions for this procedure. Please do NOT wear cologne, perfume, aftershave, or lotions (deodorant is allowed). Please arrive 15 minutes prior to your appointment time.  Please note: We ask at that you not bring children with you during ultrasound (echo/ vascular) testing. Due to room size and safety concerns, children are not allowed in the ultrasound rooms during exams. Our front office staff cannot provide observation of children in our lobby area while testing is being conducted. An adult accompanying a patient to their appointment will only be allowed in the ultrasound room at the discretion of the ultrasound technician under special circumstances. We apologize for any inconvenience.  Follow-Up: Your physician recommends that you schedule a follow-up appointment in: 6 months with Dr. Wyline Mood  Any Other Special Instructions Will Be Listed Below (If Applicable). You have been referred to Dr. Wolfgang Phoenix (Nephrology)  If you need a refill on your cardiac medications before your next appointment, please call your pharmacy.

## 2023-04-19 DIAGNOSIS — Z8619 Personal history of other infectious and parasitic diseases: Secondary | ICD-10-CM | POA: Diagnosis not present

## 2023-04-19 DIAGNOSIS — R7989 Other specified abnormal findings of blood chemistry: Secondary | ICD-10-CM | POA: Diagnosis not present

## 2023-04-20 LAB — HEPATIC FUNCTION PANEL
ALT: 20 IU/L (ref 0–44)
AST: 26 IU/L (ref 0–40)
Albumin: 4.3 g/dL (ref 3.8–4.9)
Alkaline Phosphatase: 132 IU/L — ABNORMAL HIGH (ref 44–121)
Bilirubin Total: 0.3 mg/dL (ref 0.0–1.2)
Bilirubin, Direct: 0.12 mg/dL (ref 0.00–0.40)
Total Protein: 7.7 g/dL (ref 6.0–8.5)

## 2023-04-21 LAB — H. PYLORI ANTIGEN, STOOL: H pylori Ag, Stl: NEGATIVE

## 2023-04-22 ENCOUNTER — Other Ambulatory Visit (HOSPITAL_COMMUNITY)
Admission: RE | Admit: 2023-04-22 | Discharge: 2023-04-22 | Disposition: A | Discharge status: Home / Self Care | Source: Ambulatory Visit | Attending: Physician Assistant | Admitting: Physician Assistant

## 2023-04-22 DIAGNOSIS — I1A0 Resistant hypertension: Secondary | ICD-10-CM | POA: Diagnosis not present

## 2023-04-22 LAB — URINALYSIS, COMPLETE (UACMP) WITH MICROSCOPIC
Bacteria, UA: NONE SEEN
Bilirubin Urine: NEGATIVE
Glucose, UA: >=500 mg/dL — AB
Hgb urine dipstick: NEGATIVE
Ketones, ur: NEGATIVE mg/dL
Leukocytes,Ua: NEGATIVE
Nitrite: NEGATIVE
Protein, ur: NEGATIVE mg/dL
Specific Gravity, Urine: 1.013 (ref 1.005–1.030)
pH: 5 (ref 5.0–8.0)

## 2023-04-25 ENCOUNTER — Other Ambulatory Visit: Payer: Self-pay

## 2023-04-25 DIAGNOSIS — R7989 Other specified abnormal findings of blood chemistry: Secondary | ICD-10-CM

## 2023-04-25 LAB — CATECHOLAMINES,UR.,FREE,24 HR: Total Volume: 2100

## 2023-05-01 ENCOUNTER — Other Ambulatory Visit: Payer: Self-pay

## 2023-05-01 DIAGNOSIS — N1831 Chronic kidney disease, stage 3a: Secondary | ICD-10-CM

## 2023-05-01 DIAGNOSIS — I1A0 Resistant hypertension: Secondary | ICD-10-CM

## 2023-05-02 LAB — METANEPHRINES, URINE, 24 HOUR
Metaneph Total, Ur: 52 ug/L
Metanephrines, 24H Ur: 109 ug/(24.h) (ref 58–276)
Normetanephrine, 24H Ur: 317 ug/(24.h) (ref 156–729)
Normetanephrine, Ur: 151 ug/L
Total Volume: 2100

## 2023-05-02 NOTE — Addendum Note (Signed)
 Addended by: Leonides Schanz C on: 05/02/2023 12:58 PM   Modules accepted: Orders

## 2023-05-06 NOTE — Telephone Encounter (Signed)
 Please find out from lab how they handle 24 hour urine cateholamine test with pt question below. Thank you!

## 2023-05-09 ENCOUNTER — Encounter: Payer: Self-pay | Admitting: Nurse Practitioner

## 2023-05-09 ENCOUNTER — Ambulatory Visit: Admitting: Nurse Practitioner

## 2023-05-09 ENCOUNTER — Other Ambulatory Visit (HOSPITAL_COMMUNITY)
Admission: RE | Admit: 2023-05-09 | Discharge: 2023-05-09 | Disposition: A | Discharge status: Home / Self Care | Source: Ambulatory Visit | Attending: Physician Assistant | Admitting: Physician Assistant

## 2023-05-09 VITALS — BP 116/72 | HR 61 | Ht 69.0 in | Wt 298.4 lb

## 2023-05-09 DIAGNOSIS — G4733 Obstructive sleep apnea (adult) (pediatric): Secondary | ICD-10-CM

## 2023-05-09 DIAGNOSIS — E66813 Obesity, class 3: Secondary | ICD-10-CM | POA: Diagnosis not present

## 2023-05-09 DIAGNOSIS — I1A0 Resistant hypertension: Secondary | ICD-10-CM

## 2023-05-09 DIAGNOSIS — N1831 Chronic kidney disease, stage 3a: Secondary | ICD-10-CM | POA: Insufficient documentation

## 2023-05-09 DIAGNOSIS — Z6841 Body Mass Index (BMI) 40.0 and over, adult: Secondary | ICD-10-CM

## 2023-05-09 NOTE — Assessment & Plan Note (Signed)
 Severe OSA. Currently utilizing outdated machine. He does receive benefit from use, likely due to age of machine and possible ineffective control. Download not available. Given his last sleep study was 2 years ago, he will need a repeat HST to reassess severity of sleep apnea. Suspicion for OSA remains high. If appropriate, will send orders for new CPAP once updated HST received. Reviewed risks of untreated OSA and potential treatment options. Encouraged to continue utilizing previous machine (aside from night of HST) until new machine received. Safe driving practices reviewed. Healthy weight loss encouraged.  Patient Instructions  Continue to use CPAP every night, minimum of 4-6 hours a night.  Change equipment as directed. Wash your tubing with warm soap and water daily, hang to dry. Wash humidifier portion weekly. Use bottled, distilled water and change daily Be aware of reduced alertness and do not drive or operate heavy machinery if experiencing this or drowsiness.  Exercise encouraged, as tolerated. Healthy weight management discussed.  Avoid or decrease alcohol consumption and medications that make you more sleepy, if possible. Notify if persistent daytime sleepiness occurs even with consistent use of PAP therapy.  We discussed how untreated sleep apnea puts an individual at risk for cardiac arrhthymias, pulm HTN, DM, stroke and increases their risk for daytime accidents.   Since your machine is so old and it's been over a year since your last study, we will have you repeat a home sleep study to reassess severity of your sleep apnea and then get you a new CPAP machine. Call me if you haven't heard something in 2 weeks about scheduling the study  We can also try a different mask such as a dreamwear full face or similar  Follow up in 12 weeks with Katie Mirel Hundal,NP to review sleep study and see how new CPAP is working, or sooner, if needed. Ok to do virtual

## 2023-05-09 NOTE — Progress Notes (Signed)
 @Patient  ID: Nicholas Turner, male    DOB: 1970-06-10, 53 y.o.   MRN: [id number]  Chief Complaint  Patient presents with   Obstructive Sleep Apnea    Referring provider: Omie Bickers, MD  HPI: 53 year old male, never smoker followed for severe obstructive sleep apnea. He is a patient of Dr. Carlyle Childes and last seen 05/24/2021. Past medical history significant for HTN, DM, arthritis, HLD, obesity.    TEST/EVENTS: 04/14/2021 HST: AHI 30.1/h, SpO2 low 72%  02/16/2021: OV with Dr. Matilde Son. Hx of moderate OSA and previously on CPAP therapy. Machine stopped working and he didn't have adequate insurance and couldn't afford a new machine so hadn't used one in several years. Recently encouraged by cardiologist to have repeat sleep study d/t poorly controlled HTN. Wife reported frequent snoring and apneic events at night. Wakes up feeling tired. Epworth 4/24. HTN at visit; possible white coat syndrome but advised to follow up with cardiology. HST ordered.   05/24/2021: virtual visit with Nicholas Sanson NP for sleep study follow up. He had HST completed on 04/14/2021 showing severe OSA with AHI 30.1 and SpO2 low 72%. He previously was on CPAP therapy and is agreeable to starting back again. He continues to have daytime fatigue symptoms and wakes feeling tired in the morning. He falls asleep relatively easily and only wakes to use the restroom; gets around 8-9 hours of sleep a night. He denies morning headaches, drowsy driving, narcolepsy or cataplexy.  05/09/2023: Today - follow up Discussed the use of AI scribe software for clinical note transcription with the patient, who gave verbal consent to proceed.  History of Present Illness   Nicholas Turner is a 53 year old male with sleep apnea who presents for follow-up regarding CPAP therapy.  He is currently using an old CPAP machine with a nasal pillow mask. He never got a new machine after our last visit due to insurance issues.   He feels 'a little bit better' since  resuming CPAP therapy, although he still experiences some daytime sleepiness. His wife reports that he continues to snore with the CPAP.   He has not experienced any issues with drowsy driving. He is considering trying a full face mask but is concerned about fit due to his beard. He is open to exploring different mask options.       Allergies  Allergen Reactions   Clonidine Derivatives Other (See Comments)    Welps   Oxycodone Nausea And Vomiting    Immunization History  Administered Date(s) Administered   Influenza-Unspecified 10/22/2017, 11/05/2017    Past Medical History:  Diagnosis Date   Anxiety    Arthritis    Chronic neck pain    Depression    Diverticulosis    DM (diabetes mellitus) (HCC) 09/01/2010       Fatty liver    Gastroparesis    Generalized headaches    GERD (gastroesophageal reflux disease)    Gout 11/26/2011   History of kidney stones    Hyperlipidemia    Hypertension    Knee pain    feet    Obesity (BMI 30-39.9) 09/01/2010   Palpitation    SLEEP APNEA 09/09/2009   Qualifier: Diagnosis of  By: Via LPN, Tanis Fan     UTI (lower urinary tract infection)     Tobacco History: Social History   Tobacco Use  Smoking Status Never  Smokeless Tobacco Never  Tobacco Comments   Never smoker   Counseling given: Not Answered Tobacco comments: Never  smoker   Outpatient Medications Prior to Visit  Medication Sig Dispense Refill   allopurinol (ZYLOPRIM) 300 MG tablet Take 300 mg by mouth daily.     atorvastatin (LIPITOR) 40 MG tablet Take 1 tablet by mouth once daily 15 tablet 0   buPROPion (WELLBUTRIN XL) 300 MG 24 hr tablet Take 300 mg by mouth daily.     colchicine 0.6 MG tablet Take 0.6 mg by mouth daily.     furosemide (LASIX) 20 MG tablet Take 20 mg by mouth daily as needed for fluid.     hydrALAZINE (APRESOLINE) 100 MG tablet Take 1 tablet (100 mg total) by mouth 3 (three) times daily. 270 tablet 3   ibuprofen (ADVIL) 200 MG tablet Take 400 mg by  mouth every 6 (six) hours as needed for moderate pain (pain score 4-6).     JARDIANCE 25 MG TABS tablet Take 25 mg by mouth daily.     metFORMIN (GLUCOPHAGE) 500 MG tablet Take 500 mg by mouth daily with breakfast.     metoprolol tartrate (LOPRESSOR) 100 MG tablet Take 1 tablet (100 mg total) by mouth 2 (two) times daily. 180 tablet 3   NIFEdipine (ADALAT CC) 30 MG 24 hr tablet Take 30 mg by mouth daily.     olmesartan (BENICAR) 40 MG tablet Take 1 tablet (40 mg total) by mouth daily with lunch. 90 tablet 3   SOLIQUA 100-33 UNT-MCG/ML SOPN Inject 42 Units into the skin at bedtime.     No facility-administered medications prior to visit.     Review of Systems:   Constitutional: No weight loss or gain, night sweats, fevers, chills. +daytime fatigue HEENT: No headaches, difficulty swallowing, tooth/dental problems, or sore throat. No sneezing, itching, ear ache, nasal congestion, or post nasal drip.  CV:  No chest pain, orthopnea, PND, swelling in lower extremities, anasarca, dizziness, palpitations, syncope Resp: +witnessed nocturnal apneas (without CPAP); snoring. No shortness of breath with exertion or at rest. No excess mucus or change in color of mucus. No productive or non-productive. No hemoptysis. No wheezing.  No chest wall deformity Skin: No rash, lesions, ulcerations MSK:  No joint pain or swelling.  No decreased range of motion.  No back pain. Neuro: No dizziness or lightheadedness.  Psych: No depression or anxiety. Mood stable.     Physical Exam:  BP 116/72   Pulse 61   Ht 5\' 9"  (1.753 m)   Wt 298 lb 6 oz (135.3 kg)   SpO2 93%   BMI 44.06 kg/m   GEN: Pleasant, interactive, well-appearing; obese; in no acute distress HEENT:  Normocephalic and atraumatic. PERRLA. Sclera white. Nasal turbinates pink, moist and patent bilaterally. No rhinorrhea present. Oropharynx pink and moist, without exudate or edema. No lesions, ulcerations, or postnasal drip. Mallampati III NECK:   Supple w/ fair ROM. Thyroid symmetrical with no goiter or nodules palpated. No lymphadenopathy.   CV: RRR, no m/r/g, no peripheral edema. Pulses intact, +2 bilaterally. No cyanosis, pallor or clubbing. PULMONARY:  Unlabored, regular breathing. Clear bilaterally A&P w/o wheezes/rales/rhonchi. No accessory muscle use.  GI: BS present and normoactive. Soft, non-tender to palpation. No organomegaly or masses detected.  MSK: No erythema, warmth or tenderness. Cap refil <2 sec all extrem. No deformities or joint swelling noted.  Neuro: A/Ox3. No focal deficits noted.   Skin: Warm, no lesions or rashe Psych: Normal affect and behavior. Judgement and thought content appropriate.     Lab Results:  CBC    Component Value Date/Time  WBC 5.9 07/06/2021 2105   RBC 4.60 07/06/2021 2105   HGB 13.9 07/06/2021 2105   HCT 45.2 07/06/2021 2105   PLT 183 07/06/2021 2105   MCV 98.3 07/06/2021 2105   MCH 30.2 07/06/2021 2105   MCHC 30.8 07/06/2021 2105   RDW 14.2 07/06/2021 2105   LYMPHSABS 2.2 07/06/2021 2105   MONOABS 0.6 07/06/2021 2105   EOSABS 0.3 07/06/2021 2105   BASOSABS 0.1 07/06/2021 2105    BMET    Component Value Date/Time   NA 138 04/15/2023 1559   K 4.5 04/15/2023 1559   CL 105 04/15/2023 1559   CO2 22 04/15/2023 1559   GLUCOSE 119 (H) 04/15/2023 1559   BUN 25 (H) 04/15/2023 1559   CREATININE 1.49 (H) 04/15/2023 1559   CREATININE 0.83 04/01/2012 0840   CALCIUM 9.8 04/15/2023 1559   GFRNONAA 55 (L) 04/15/2023 1559   GFRAA >60 09/10/2014 1547    BNP    Component Value Date/Time   BNP 34.0 07/06/2021 2105     Imaging:  No results found.  Administration History     None           No data to display          No results found for: "NITRICOXIDE"      Assessment & Plan:   Severe obstructive sleep apnea Severe OSA. Currently utilizing outdated machine. He does receive benefit from use, likely due to age of machine and possible ineffective control.  Download not available. Given his last sleep study was 2 years ago, he will need a repeat HST to reassess severity of sleep apnea. Suspicion for OSA remains high. If appropriate, will send orders for new CPAP once updated HST received. Reviewed risks of untreated OSA and potential treatment options. Encouraged to continue utilizing previous machine (aside from night of HST) until new machine received. Safe driving practices reviewed. Healthy weight loss encouraged.  Patient Instructions  Continue to use CPAP every night, minimum of 4-6 hours a night.  Change equipment as directed. Wash your tubing with warm soap and water daily, hang to dry. Wash humidifier portion weekly. Use bottled, distilled water and change daily Be aware of reduced alertness and do not drive or operate heavy machinery if experiencing this or drowsiness.  Exercise encouraged, as tolerated. Healthy weight management discussed.  Avoid or decrease alcohol consumption and medications that make you more sleepy, if possible. Notify if persistent daytime sleepiness occurs even with consistent use of PAP therapy.  We discussed how untreated sleep apnea puts an individual at risk for cardiac arrhthymias, pulm HTN, DM, stroke and increases their risk for daytime accidents.   Since your machine is so old and it's been over a year since your last study, we will have you repeat a home sleep study to reassess severity of your sleep apnea and then get you a new CPAP machine. Call me if you haven't heard something in 2 weeks about scheduling the study  We can also try a different mask such as a dreamwear full face or similar  Follow up in 12 weeks with Nicholas Jerron Niblack,NP to review sleep study and see how new CPAP is working, or sooner, if needed. Ok to do virtual    Class 3 severe obesity with body mass index (BMI) of 40.0 to 44.9 in adult (HCC) BMI 44. Healthy weight loss encouraged  Advised if symptoms do not improve or worsen, to please  contact office for sooner follow up or seek emergency care.  I spent 35 minutes of dedicated to the care of this patient on the date of this encounter to include pre-visit review of records, face-to-face time with the patient discussing conditions above, post visit ordering of testing, clinical documentation with the electronic health record, making appropriate referrals as documented, and communicating necessary findings to members of the patients care team.  Roetta Clarke, NP 05/09/2023  Pt aware and understands NP's role.

## 2023-05-09 NOTE — Assessment & Plan Note (Signed)
BMI 44. Healthy weight loss encouraged.  

## 2023-05-09 NOTE — Patient Instructions (Signed)
 Continue to use CPAP every night, minimum of 4-6 hours a night.  Change equipment as directed. Wash your tubing with warm soap and water daily, hang to dry. Wash humidifier portion weekly. Use bottled, distilled water and change daily Be aware of reduced alertness and do not drive or operate heavy machinery if experiencing this or drowsiness.  Exercise encouraged, as tolerated. Healthy weight management discussed.  Avoid or decrease alcohol consumption and medications that make you more sleepy, if possible. Notify if persistent daytime sleepiness occurs even with consistent use of PAP therapy.  We discussed how untreated sleep apnea puts an individual at risk for cardiac arrhthymias, pulm HTN, DM, stroke and increases their risk for daytime accidents.   Since your machine is so old and it's been over a year since your last study, we will have you repeat a home sleep study to reassess severity of your sleep apnea and then get you a new CPAP machine. Call me if you haven't heard something in 2 weeks about scheduling the study  We can also try a different mask such as a dreamwear full face or similar  Follow up in 12 weeks with Katie Khori Rosevear,NP to review sleep study and see how new CPAP is working, or sooner, if needed. Ok to do virtual

## 2023-05-10 ENCOUNTER — Ambulatory Visit (HOSPITAL_COMMUNITY)
Admission: RE | Admit: 2023-05-10 | Discharge: 2023-05-10 | Disposition: A | Discharge status: Home / Self Care | Source: Ambulatory Visit | Attending: Physician Assistant | Admitting: Physician Assistant

## 2023-05-10 DIAGNOSIS — R600 Localized edema: Secondary | ICD-10-CM

## 2023-05-10 DIAGNOSIS — I517 Cardiomegaly: Secondary | ICD-10-CM | POA: Diagnosis not present

## 2023-05-10 LAB — ECHOCARDIOGRAM COMPLETE
Area-P 1/2: 4.21 cm2
S' Lateral: 2.9 cm

## 2023-05-10 NOTE — Progress Notes (Signed)
*  PRELIMINARY RESULTS* Echocardiogram 2D Echocardiogram has been performed.  Bernis Brisker 05/10/2023, 2:33 PM

## 2023-05-17 LAB — CATECHOLAMINES,UR.,FREE,24 HR
Dopamine, Rand Ur: 94 ug/L
Dopamine, Ur, 24Hr: 202 ug/(24.h) (ref 0–510)
Epinephrine, Rand Ur: <3 ug/L
Epinephrine, U, 24Hr: <6 ug/(24.h) (ref 0–20)
Norepinephrine, Rand Ur: 36 ug/L
Norepinephrine,U,24H: 77 ug/(24.h) (ref 0–135)
Total Volume: 2150

## 2023-05-30 ENCOUNTER — Ambulatory Visit: Admitting: Nurse Practitioner

## 2023-07-02 ENCOUNTER — Encounter

## 2023-07-09 ENCOUNTER — Ambulatory Visit

## 2023-07-09 DIAGNOSIS — G4733 Obstructive sleep apnea (adult) (pediatric): Secondary | ICD-10-CM

## 2023-07-25 DIAGNOSIS — E1165 Type 2 diabetes mellitus with hyperglycemia: Secondary | ICD-10-CM | POA: Diagnosis not present

## 2023-08-09 ENCOUNTER — Ambulatory Visit: Admitting: Nurse Practitioner

## 2023-08-13 ENCOUNTER — Telehealth: Payer: Self-pay

## 2023-08-13 NOTE — Telephone Encounter (Signed)
 Pt did not get a good test first time he did it. Had to reschedule for him to re do it. Now has an appt 8/8 to pick up HST box to re do HST.

## 2023-08-13 NOTE — Telephone Encounter (Addendum)
 Checking charts.  Called patient to verify that patient had done his HST for his OV with Izetta Rouleau, NP tomorrow, 08/14/2023.  Patient states he has done the HST but has not heard about the results.  States he picked up a HST kit here at [employer] office on [address] location around 07/09/2023.  Do not see any results in EPIC.  Will contact our [facility] team to get update.  Patient will await response and has OV with Katie tomorrow, 08/14/2023.

## 2023-08-14 ENCOUNTER — Ambulatory Visit: Admitting: Nurse Practitioner

## 2023-08-30 ENCOUNTER — Encounter

## 2023-09-05 NOTE — Telephone Encounter (Signed)
 Mr. Maler completed his HST on 8/8. Download have been sent to Dr. Alva for review and signature. Dr. Jude is out of the office until next week. Once signed, we will scan the report and coordinate with Izetta if she wants to schedule a follow-up appointment, or send her a message once study is uploaded. Please advise on how to proceed for follow-up.

## 2023-09-12 DIAGNOSIS — G4733 Obstructive sleep apnea (adult) (pediatric): Secondary | ICD-10-CM | POA: Diagnosis not present

## 2023-09-13 NOTE — Telephone Encounter (Signed)
 The HST has been signed and scanned into the chart. Izetta, would you like me to schedule a follow-up appointment for Mr. Askren? please advise on how you would like to proceed, thank you.

## 2023-09-16 NOTE — Telephone Encounter (Signed)
 Called Nicholas Turner and left a voicemail to return my call. He called back, and I schedule his video office visit for Friday, 8/29, @ 13:30. Nothing further needed at this time.

## 2023-09-16 NOTE — Telephone Encounter (Signed)
 Yes, needs OV. Can be virtual. Thanks

## 2023-09-16 NOTE — Telephone Encounter (Signed)
 Good Afternoon Katie,   Thank you. I will reach out to Mr. Persichetti to get office visit scheduled.

## 2023-09-20 ENCOUNTER — Telehealth: Admitting: Nurse Practitioner

## 2023-09-20 ENCOUNTER — Encounter: Payer: Self-pay | Admitting: Nurse Practitioner

## 2023-09-20 DIAGNOSIS — E66813 Obesity, class 3: Secondary | ICD-10-CM | POA: Diagnosis not present

## 2023-09-20 DIAGNOSIS — G4733 Obstructive sleep apnea (adult) (pediatric): Secondary | ICD-10-CM

## 2023-09-20 DIAGNOSIS — Z6841 Body Mass Index (BMI) 40.0 and over, adult: Secondary | ICD-10-CM | POA: Diagnosis not present

## 2023-09-20 NOTE — Progress Notes (Signed)
 Patient ID: Nicholas Turner, male     DOB: 1970-10-19, 53 y.o.      MRN: [id number]  No chief complaint on file.   Virtual Visit via Video Note  I connected with Nicholas Turner on 09/20/23 at  1:30 PM EDT by a video enabled telemedicine application and verified that I am speaking with the correct person using two identifiers.  Location: Patient: Home Provider: Office   I discussed the limitations of evaluation and management by telemedicine and the availability of in person appointments. The patient expressed understanding and agreed to proceed.  History of Present Illness: 53 year old male, never smoker followed for severe obstructive sleep apnea. He is a patient of Dr. Magdaleno and last seen 05/09/2023 by [facility]. Past medical history significant for HTN, DM, arthritis, HLD, obesity.    TEST/EVENTS: 04/14/2021 HST: AHI 30.1/h, SpO2 low 72% 09/01/2023 HST: AHI 14.3/h, SpO2 low 83%   02/16/2021: OV with Dr. Shellia. Hx of moderate OSA and previously on CPAP therapy. Machine stopped working and he didn't have adequate insurance and couldn't afford a new machine so hadn't used one in several years. Recently encouraged by cardiologist to have repeat sleep study d/t poorly controlled HTN. Wife reported frequent snoring and apneic events at night. Wakes up feeling tired. Epworth 4/24. HTN at visit; possible white coat syndrome but advised to follow up with cardiology. HST ordered.   05/24/2021: virtual visit with Nicholas Curfman NP for sleep study follow up. He had HST completed on 04/14/2021 showing severe OSA with AHI 30.1 and SpO2 low 72%. He previously was on CPAP therapy and is agreeable to starting back again. He continues to have daytime fatigue symptoms and wakes feeling tired in the morning. He falls asleep relatively easily and only wakes to use the restroom; gets around 8-9 hours of sleep a night. He denies morning headaches, drowsy driving, narcolepsy or cataplexy.   05/09/2023: SHERLEAN with Angle Dirusso  NP Discussed the use of AI scribe software for clinical note transcription with the patient, who gave verbal consent to proceed. Nicholas Turner is a 53 year old male with sleep apnea who presents for follow-up regarding CPAP therapy. He is currently using an old CPAP machine with a nasal pillow mask. He never got a new machine after our last visit due to insurance issues.  He feels 'a little bit better' since resuming CPAP therapy, although he still experiences some daytime sleepiness. His wife reports that he continues to snore with the CPAP.  He has not experienced any issues with drowsy driving. He is considering trying a full face mask but is concerned about fit due to his beard. He is open to exploring different mask options.   09/20/2023: Today - follow up Patient presents today for follow up after repeat HST that revealed mildly moderate OSA. He has been using an old CPAP machine. He wants to get a new one. Receives benefit from use. Feels like his sleep is more restful and energy levels are better with CPAP. He does want to try a full face mask. He denies drowsy driving, sleep parasomnias, morning headaches. Not sure what his CPAP settings are at.   Allergies  Allergen Reactions   Clonidine Derivatives Other (See Comments)    Welps   Oxycodone  Nausea And Vomiting   Immunization History  Administered Date(s) Administered   Influenza-Unspecified 10/22/2017, 11/05/2017   Past Medical History:  Diagnosis Date   Anxiety    Arthritis    Chronic neck  pain    Depression    Diverticulosis    DM (diabetes mellitus) (HCC) 09/01/2010       Fatty liver    Gastroparesis    Generalized headaches    GERD (gastroesophageal reflux disease)    Gout 11/26/2011   History of kidney stones    Hyperlipidemia    Hypertension    Knee pain    feet    Obesity (BMI 30-39.9) 09/01/2010   Palpitation    SLEEP APNEA 09/09/2009   Qualifier: Diagnosis of  By: Via LPN, Macario     UTI (lower urinary  tract infection)     Tobacco History: Social History   Tobacco Use  Smoking Status Never  Smokeless Tobacco Never  Tobacco Comments   Never smoker   Counseling given: Not Answered Tobacco comments: Never smoker   Outpatient Medications Prior to Visit  Medication Sig Dispense Refill   allopurinol (ZYLOPRIM) 300 MG tablet Take 300 mg by mouth daily.     atorvastatin  (LIPITOR) 40 MG tablet Take 1 tablet by mouth once daily 15 tablet 0   buPROPion (WELLBUTRIN XL) 300 MG 24 hr tablet Take 300 mg by mouth daily.     colchicine  0.6 MG tablet Take 0.6 mg by mouth daily.     furosemide (LASIX) 20 MG tablet Take 20 mg by mouth daily as needed for fluid.     hydrALAZINE  (APRESOLINE ) 100 MG tablet Take 1 tablet (100 mg total) by mouth 3 (three) times daily. 270 tablet 3   ibuprofen  (ADVIL ) 200 MG tablet Take 400 mg by mouth every 6 (six) hours as needed for moderate pain (pain score 4-6).     JARDIANCE 25 MG TABS tablet Take 25 mg by mouth daily.     metFORMIN  (GLUCOPHAGE ) 500 MG tablet Take 500 mg by mouth daily with breakfast.     metoprolol  tartrate (LOPRESSOR ) 100 MG tablet Take 1 tablet (100 mg total) by mouth 2 (two) times daily. 180 tablet 3   NIFEdipine (ADALAT CC) 30 MG 24 hr tablet Take 30 mg by mouth daily.     olmesartan  (BENICAR ) 40 MG tablet Take 1 tablet (40 mg total) by mouth daily with lunch. 90 tablet 3   SOLIQUA 100-33 UNT-MCG/ML SOPN Inject 42 Units into the skin at bedtime.     No facility-administered medications prior to visit.     Review of Systems:  Constitutional: No weight loss or gain, night sweats, fevers, chills. +daytime fatigue (improved) HEENT: No headaches CV:  No chest pain, orthopnea, PND Resp: +witnessed nocturnal apneas (without CPAP); snoring (without CPAP) Neuro: No memory impairment  Psych: No depression or anxiety. Mood stable.   Observations/Objective: Patient is well-developed, well-nourished in no acute distress.  Resting comfortably at  home.  No labored breathing.  Speech is clear and coherent with logical content.  Patient is alert and oriented at baseline.   Assessment and Plan: OSA on CPAP Mildly moderate OSA. Reviewed risks of untreated OSA and potential treatment options. Shared decision to continue CPAP. Receives benefit from use of CPAP therapy. Will place orders for new CPAP machine and mask of choice. Reviewed proper care/use of device. Healthy weight management encouraged. Safe driving practices reviewed.   Patient Instructions  Continue to use CPAP every night, minimum of 4-6 hours a night.  Change equipment as directed. Wash your tubing with warm soap and water  daily, hang to dry. Wash humidifier portion weekly. Use bottled, distilled water  and change daily Be aware of reduced alertness and  do not drive or operate heavy machinery if experiencing this or drowsiness.  Exercise encouraged, as tolerated. Healthy weight management discussed.  Avoid or decrease alcohol consumption and medications that make you more sleepy, if possible. Notify if persistent daytime sleepiness occurs even with consistent use of PAP therapy.  Change CPAP supplies... Every month Mask cushions and/or nasal pillows CPAP machine filters Every 3 months Mask frame (not including the headgear) CPAP tubing Every 6 months Mask headgear Chin strap (if applicable) Humidifier water  tub  Orders placed for new CPAP. Let me know if you haven't heard something in the next 2-3 weeks  Follow up November 14th at 130 PM via video visit with Katie Gerica Koble,NP , or sooner, if needed    Class 3 severe obesity with body mass index (BMI) of 40.0 to 44.9 in adult Healthy weight management encouraged. Aware of correlation between OSA and obesity      I discussed the assessment and treatment plan with the patient. The patient was provided an opportunity to ask questions and all were answered. The patient agreed with the plan and demonstrated an  understanding of the instructions.   The patient was advised to call back or seek an in-person evaluation if the symptoms worsen or if the condition fails to improve as anticipated.  I provided 31 minutes of non-face-to-face time during this encounter.   Comer LULLA Rouleau, NP

## 2023-09-20 NOTE — Assessment & Plan Note (Signed)
 Healthy weight management encouraged. Aware of correlation between OSA and obesity

## 2023-09-20 NOTE — Assessment & Plan Note (Signed)
 Mildly moderate OSA. Reviewed risks of untreated OSA and potential treatment options. Shared decision to continue CPAP. Receives benefit from use of CPAP therapy. Will place orders for new CPAP machine and mask of choice. Reviewed proper care/use of device. Healthy weight management encouraged. Safe driving practices reviewed.   Patient Instructions  Continue to use CPAP every night, minimum of 4-6 hours a night.  Change equipment as directed. Wash your tubing with warm soap and water  daily, hang to dry. Wash humidifier portion weekly. Use bottled, distilled water  and change daily Be aware of reduced alertness and do not drive or operate heavy machinery if experiencing this or drowsiness.  Exercise encouraged, as tolerated. Healthy weight management discussed.  Avoid or decrease alcohol consumption and medications that make you more sleepy, if possible. Notify if persistent daytime sleepiness occurs even with consistent use of PAP therapy.  Change CPAP supplies... Every month Mask cushions and/or nasal pillows CPAP machine filters Every 3 months Mask frame (not including the headgear) CPAP tubing Every 6 months Mask headgear Chin strap (if applicable) Humidifier water  tub  Orders placed for new CPAP. Let me know if you haven't heard something in the next 2-3 weeks  Follow up November 14th at 130 PM via video visit with Katie Burhan Barham,NP , or sooner, if needed

## 2023-09-20 NOTE — Patient Instructions (Addendum)
 Continue to use CPAP every night, minimum of 4-6 hours a night.  Change equipment as directed. Wash your tubing with warm soap and water  daily, hang to dry. Wash humidifier portion weekly. Use bottled, distilled water  and change daily Be aware of reduced alertness and do not drive or operate heavy machinery if experiencing this or drowsiness.  Exercise encouraged, as tolerated. Healthy weight management discussed.  Avoid or decrease alcohol consumption and medications that make you more sleepy, if possible. Notify if persistent daytime sleepiness occurs even with consistent use of PAP therapy.  Change CPAP supplies... Every month Mask cushions and/or nasal pillows CPAP machine filters Every 3 months Mask frame (not including the headgear) CPAP tubing Every 6 months Mask headgear Chin strap (if applicable) Humidifier water  tub  Orders placed for new CPAP. Let me know if you haven't heard something in the next 2-3 weeks  Follow up November 14th at 130 PM via video visit with Katie Clarinda Obi,NP , or sooner, if needed

## 2023-09-24 DIAGNOSIS — N189 Chronic kidney disease, unspecified: Secondary | ICD-10-CM | POA: Diagnosis not present

## 2023-09-24 DIAGNOSIS — R778 Other specified abnormalities of plasma proteins: Secondary | ICD-10-CM | POA: Diagnosis not present

## 2023-10-03 ENCOUNTER — Other Ambulatory Visit: Payer: Self-pay

## 2023-10-03 DIAGNOSIS — R7989 Other specified abnormal findings of blood chemistry: Secondary | ICD-10-CM

## 2023-10-15 DIAGNOSIS — G4733 Obstructive sleep apnea (adult) (pediatric): Secondary | ICD-10-CM | POA: Diagnosis not present

## 2023-10-22 LAB — HEPATIC FUNCTION PANEL
ALT: 29 IU/L (ref 0–44)
AST: 26 IU/L (ref 0–40)
Albumin: 4.2 g/dL (ref 3.8–4.9)
Alkaline Phosphatase: 194 IU/L — ABNORMAL HIGH (ref 47–123)
Bilirubin Total: 0.3 mg/dL (ref 0.0–1.2)
Bilirubin, Direct: 0.14 mg/dL (ref 0.00–0.40)
Total Protein: 7.7 g/dL (ref 6.0–8.5)

## 2023-10-23 ENCOUNTER — Ambulatory Visit: Payer: Self-pay | Admitting: Gastroenterology

## 2023-11-21 ENCOUNTER — Encounter (HOSPITAL_COMMUNITY): Payer: Self-pay | Admitting: Emergency Medicine

## 2023-11-21 ENCOUNTER — Emergency Department (HOSPITAL_COMMUNITY)
Admission: EM | Admit: 2023-11-21 | Discharge: 2023-11-22 | Disposition: A | Discharge status: Home / Self Care | Attending: Emergency Medicine | Admitting: Emergency Medicine

## 2023-11-21 ENCOUNTER — Other Ambulatory Visit: Payer: Self-pay

## 2023-11-21 DIAGNOSIS — Z7984 Long term (current) use of oral hypoglycemic drugs: Secondary | ICD-10-CM | POA: Insufficient documentation

## 2023-11-21 DIAGNOSIS — Z79899 Other long term (current) drug therapy: Secondary | ICD-10-CM | POA: Insufficient documentation

## 2023-11-21 DIAGNOSIS — R030 Elevated blood-pressure reading, without diagnosis of hypertension: Secondary | ICD-10-CM | POA: Insufficient documentation

## 2023-11-21 DIAGNOSIS — M79604 Pain in right leg: Secondary | ICD-10-CM | POA: Insufficient documentation

## 2023-11-21 DIAGNOSIS — M79651 Pain in right thigh: Secondary | ICD-10-CM | POA: Insufficient documentation

## 2023-11-21 NOTE — ED Triage Notes (Addendum)
 Pt with c/o R leg pain from his groin down. States he walked on treadmill yesterday and is unsure whether he pulled something or not. Pt also c/o elevated BP at home.

## 2023-11-22 NOTE — Discharge Instructions (Signed)
 Take Tylenol  1000 mg every 6 hours as needed for pain.  Return tomorrow at the given time for an ultrasound of your leg to rule out a blood clot.  Rest.  Apply heating pad for relief of discomfort.  Follow-up with primary doctor if not improving in the next week.

## 2023-11-22 NOTE — ED Provider Notes (Signed)
  EMERGENCY DEPARTMENT AT [facility] Provider Note   CSN: [id number] Arrival date & time: 11/21/23  2339     Patient presents with: Leg Pain   Nicholas Turner is a 53 y.o. male.   Patient is a 52 year old male presenting with complaints of right leg pain.  He describes discomfort to the right anterior thigh that started yesterday and is worsening.  Pain is worse when he moves, palpates, or walks.  He denies any swelling of the leg.  No chest pain or difficulty breathing.  No history of blood clots.       Prior to Admission medications   Medication Sig Start Date End Date Taking? Authorizing Provider  allopurinol (ZYLOPRIM) 300 MG tablet Take 300 mg by mouth daily.    [provider]  atorvastatin  (LIPITOR) 40 MG tablet Take 1 tablet by mouth once daily 09/27/22   O'Neal, Perry Thomas, MD  buPROPion (WELLBUTRIN XL) 300 MG 24 hr tablet Take 300 mg by mouth daily.    [provider]  colchicine  0.6 MG tablet Take 0.6 mg by mouth daily. 06/17/21   [provider]  furosemide (LASIX) 20 MG tablet Take 20 mg by mouth daily as needed for fluid.    [provider]  hydrALAZINE  (APRESOLINE ) 100 MG tablet Take 1 tablet (100 mg total) by mouth 3 (three) times daily. 02/28/23   Dunn, Dayna N, PA-C  ibuprofen  (ADVIL ) 200 MG tablet Take 400 mg by mouth every 6 (six) hours as needed for moderate pain (pain score 4-6).    [provider]  JARDIANCE 25 MG TABS tablet Take 25 mg by mouth daily. 04/26/22   [provider]  metFORMIN  (GLUCOPHAGE ) 500 MG tablet Take 500 mg by mouth daily with breakfast. 02/05/19   [provider]  metoprolol  tartrate (LOPRESSOR ) 100 MG tablet Take 1 tablet (100 mg total) by mouth 2 (two) times daily. 02/20/23 05/21/23  Dunn, Dayna N, PA-C  NIFEdipine (ADALAT CC) 30 MG 24 hr tablet Take 30 mg by mouth daily. 06/15/21   [provider]  olmesartan  (BENICAR ) 40 MG tablet Take 1 tablet (40  mg total) by mouth daily with lunch. 02/20/23   Dunn, Dayna N, PA-C  SOLIQUA 100-33 UNT-MCG/ML SOPN Inject 42 Units into the skin at bedtime. 04/08/20   [provider]    Allergies: Clonidine derivatives and Oxycodone     Review of Systems  All other systems reviewed and are negative.   Updated Vital Signs BP (!) 148/99   Pulse 85   Temp 98 F (36.7 C) (Oral)   Resp 18   Ht 5' 9 (1.753 m)   Wt 136.1 kg   SpO2 96%   BMI 44.30 kg/m   Physical Exam Vitals and nursing note reviewed.  HENT:     Head: Normocephalic.  Pulmonary:     Effort: Pulmonary effort is normal.  Musculoskeletal:     Comments: There is mild tenderness to palpation noted in the anterior upper thigh.  There is no visible or palpable abnormality.  DP pulses are palpable and motor and sensation are intact throughout the entire leg.  Skin:    General: Skin is warm and dry.  Neurological:     Mental Status: He is alert and oriented to person, place, and time.     (all labs ordered are listed, but only abnormal results are displayed) Labs Reviewed - No data to display  EKG: None  Radiology: No results found.  Procedures   Medications Ordered in the ED - No data to display                                  Medical Decision Making  Patient presenting with right thigh pain that I suspect is musculoskeletal.  He does report walking on the treadmill every day and believes that he may have strained his leg.  He is also concerned about the possibility of a blood clot.  Physical examination reveals tenderness in this area, but no palpable or visible abnormality.  I doubt a blood clot, but will make arrangements for outpatient ultrasound.  Suspect this to be musculoskeletal and will advise Tylenol , rest, heat pad, and follow-up as needed.     Final diagnoses:  None    ED Discharge Orders     None          Geroldine Berg, MD 11/22/23 0028

## 2023-11-27 ENCOUNTER — Ambulatory Visit (HOSPITAL_COMMUNITY)

## 2023-11-27 ENCOUNTER — Encounter (HOSPITAL_COMMUNITY): Payer: Self-pay

## 2023-11-27 DIAGNOSIS — Z125 Encounter for screening for malignant neoplasm of prostate: Secondary | ICD-10-CM | POA: Diagnosis not present

## 2023-11-27 DIAGNOSIS — E782 Mixed hyperlipidemia: Secondary | ICD-10-CM | POA: Diagnosis not present

## 2023-11-27 DIAGNOSIS — E1165 Type 2 diabetes mellitus with hyperglycemia: Secondary | ICD-10-CM | POA: Diagnosis not present

## 2023-12-06 ENCOUNTER — Telehealth (INDEPENDENT_AMBULATORY_CARE_PROVIDER_SITE_OTHER): Admitting: Nurse Practitioner

## 2023-12-06 ENCOUNTER — Encounter: Payer: Self-pay | Admitting: Nurse Practitioner

## 2023-12-06 DIAGNOSIS — G4733 Obstructive sleep apnea (adult) (pediatric): Secondary | ICD-10-CM | POA: Diagnosis not present

## 2023-12-06 NOTE — Assessment & Plan Note (Signed)
 Mildly moderate OSA, on CPAP. Excellent compliance and control. Receives benefit from use. Aware of proper care/use of device. Healthy weight management encouraged. Safe driving practices reviewed.  Patient Instructions  Continue to use CPAP every night, minimum of 4-6 hours a night.  Change equipment as directed. Wash your tubing with warm soap and water  daily, hang to dry. Wash humidifier portion weekly. Use bottled, distilled water  and change daily Be aware of reduced alertness and do not drive or operate heavy machinery if experiencing this or drowsiness.  Exercise encouraged, as tolerated. Healthy weight management discussed.  Avoid or decrease alcohol consumption and medications that make you more sleepy, if possible. Notify if persistent daytime sleepiness occurs even with consistent use of PAP therapy.  Change CPAP supplies... Every month Mask cushions and/or nasal pillows CPAP machine filters Every 3 months Mask frame (not including the headgear) CPAP tubing Every 6 months Mask headgear Chin strap (if applicable) Humidifier water  tub  Follow up in September 2026 for yearly follow up and to renew CPAP supplies, or sooner, if needed

## 2023-12-06 NOTE — Progress Notes (Signed)
 Patient ID: Nicholas Turner, male     DOB: 1970-07-17, 53 y.o.      MRN: [id number]  No chief complaint on file.   Virtual Visit via Video Note  I connected with Nicholas Turner on 12/06/23 at  1:30 PM EST by a video enabled telemedicine application and verified that I am speaking with the correct person using two identifiers.  Location: Patient: Home Provider: Office   I discussed the limitations of evaluation and management by telemedicine and the availability of in person appointments. The patient expressed understanding and agreed to proceed.  History of Present Illness: 53 year old male, never smoker followed for severe obstructive sleep apnea. He is a patient of Dr. Magdaleno and last seen 09/20/2023 by [facility] NP. Past medical history significant for HTN, DM, arthritis, HLD, obesity.    TEST/EVENTS: 04/14/2021 HST: AHI 30.1/h, SpO2 low 72% 09/01/2023 HST: AHI 14.3/h, SpO2 low 83%   02/16/2021: OV with Dr. Shellia. Hx of moderate OSA and previously on CPAP therapy. Machine stopped working and he didn't have adequate insurance and couldn't afford a new machine so hadn't used one in several years. Recently encouraged by cardiologist to have repeat sleep study d/t poorly controlled HTN. Wife reported frequent snoring and apneic events at night. Wakes up feeling tired. [place] 4/24. HTN at visit; possible white coat syndrome but advised to follow up with cardiology. HST ordered.   05/24/2021: virtual visit with Anahla Bevis NP for sleep study follow up. He had HST completed on 04/14/2021 showing severe OSA with AHI 30.1 and SpO2 low 72%. He previously was on CPAP therapy and is agreeable to starting back again. He continues to have daytime fatigue symptoms and wakes feeling tired in the morning. He falls asleep relatively easily and only wakes to use the restroom; gets around 8-9 hours of sleep a night. He denies morning headaches, drowsy driving, narcolepsy or cataplexy.   05/09/2023: Nicholas Turner with Janin Kozlowski  NP Discussed the use of AI scribe software for clinical note transcription with the patient, who gave verbal consent to proceed. Nicholas Turner is a 53 year old male with sleep apnea who presents for follow-up regarding CPAP therapy. He is currently using an old CPAP machine with a nasal pillow mask. He never got a new machine after our last visit due to insurance issues.  He feels 'a little bit better' since resuming CPAP therapy, although he still experiences some daytime sleepiness. His wife reports that he continues to snore with the CPAP.  He has not experienced any issues with drowsy driving. He is considering trying a full face mask but is concerned about fit due to his beard. He is open to exploring different mask options.   09/20/2023: OV with Havard Radigan NP Patient presents today for follow up after repeat HST that revealed mildly moderate OSA. He has been using an old CPAP machine. He wants to get a new one. Receives benefit from use. Feels like his sleep is more restful and energy levels are better with CPAP. He does want to try a full face mask. He denies drowsy driving, sleep parasomnias, morning headaches. Not sure what his CPAP settings are at.   12/06/2023: Today - follow up Discussed the use of AI scribe software for clinical note transcription with the patient, who gave verbal consent to proceed.  History of Present Illness Patient is a 53 year old male with obstructive sleep apnea who presents for follow-up regarding CPAP therapy.  He feels 'pretty good' with  the new CPAP machine. He notes a significant improvement in energy levels during the day compared to before resuming CPAP therapy.   He received the new CPAP machine in September and was provided with extra supplies at that time. He has no issues with pressure settings or mask fit. Sleeping well at night. No drowsy driving.    Allergies  Allergen Reactions   Clonidine Derivatives Other (See Comments)    Welps   Oxycodone   Nausea And Vomiting   Immunization History  Administered Date(s) Administered   Influenza-Unspecified 10/22/2017, 11/05/2017   Past Medical History:  Diagnosis Date   Anxiety    Arthritis    Chronic neck pain    Depression    Diverticulosis    DM (diabetes mellitus) (HCC) 09/01/2010       Fatty liver    Gastroparesis    Generalized headaches    GERD (gastroesophageal reflux disease)    Gout 11/26/2011   History of kidney stones    Hyperlipidemia    Hypertension    Knee pain    feet    Obesity (BMI 30-39.9) 09/01/2010   Palpitation    SLEEP APNEA 09/09/2009   Qualifier: Diagnosis of  By: Via LPN, Macario     UTI (lower urinary tract infection)     Tobacco History: Social History   Tobacco Use  Smoking Status Never  Smokeless Tobacco Never  Tobacco Comments   Never smoker   Counseling given: Not Answered Tobacco comments: Never smoker   Outpatient Medications Prior to Visit  Medication Sig Dispense Refill   allopurinol (ZYLOPRIM) 300 MG tablet Take 300 mg by mouth daily.     atorvastatin  (LIPITOR) 40 MG tablet Take 1 tablet by mouth once daily 15 tablet 0   buPROPion (WELLBUTRIN XL) 300 MG 24 hr tablet Take 300 mg by mouth daily.     colchicine  0.6 MG tablet Take 0.6 mg by mouth daily.     furosemide (LASIX) 20 MG tablet Take 20 mg by mouth daily as needed for fluid.     hydrALAZINE  (APRESOLINE ) 100 MG tablet Take 1 tablet (100 mg total) by mouth 3 (three) times daily. 270 tablet 3   ibuprofen  (ADVIL ) 200 MG tablet Take 400 mg by mouth every 6 (six) hours as needed for moderate pain (pain score 4-6).     JARDIANCE 25 MG TABS tablet Take 25 mg by mouth daily.     metFORMIN  (GLUCOPHAGE ) 500 MG tablet Take 500 mg by mouth daily with breakfast.     metoprolol  tartrate (LOPRESSOR ) 100 MG tablet Take 1 tablet (100 mg total) by mouth 2 (two) times daily. 180 tablet 3   NIFEdipine (ADALAT CC) 30 MG 24 hr tablet Take 30 mg by mouth daily.     olmesartan  (BENICAR ) 40 MG  tablet Take 1 tablet (40 mg total) by mouth daily with lunch. 90 tablet 3   SOLIQUA 100-33 UNT-MCG/ML SOPN Inject 42 Units into the skin at bedtime.     No facility-administered medications prior to visit.     Review of Systems: as above   Observations/Objective: Patient is well-developed, well-nourished in no acute distress.  Resting comfortably at home.  No labored breathing.  Speech is clear and coherent with logical content.  Patient is alert and oriented at baseline.   Assessment and Plan: OSA on CPAP Mildly moderate OSA, on CPAP. Excellent compliance and control. Receives benefit from use. Aware of proper care/use of device. Healthy weight management encouraged. Safe driving practices reviewed.  Patient Instructions  Continue to use CPAP every night, minimum of 4-6 hours a night.  Change equipment as directed. Wash your tubing with warm soap and water  daily, hang to dry. Wash humidifier portion weekly. Use bottled, distilled water  and change daily Be aware of reduced alertness and do not drive or operate heavy machinery if experiencing this or drowsiness.  Exercise encouraged, as tolerated. Healthy weight management discussed.  Avoid or decrease alcohol consumption and medications that make you more sleepy, if possible. Notify if persistent daytime sleepiness occurs even with consistent use of PAP therapy.  Change CPAP supplies... Every month Mask cushions and/or nasal pillows CPAP machine filters Every 3 months Mask frame (not including the headgear) CPAP tubing Every 6 months Mask headgear Chin strap (if applicable) Humidifier water  tub  Follow up in September 2026 for yearly follow up and to renew CPAP supplies, or sooner, if needed       I discussed the assessment and treatment plan with the patient. The patient was provided an opportunity to ask questions and all were answered. The patient agreed with the plan and demonstrated an understanding of the  instructions.   The patient was advised to call back or seek an in-person evaluation if the symptoms worsen or if the condition fails to improve as anticipated.  I provided 21 minutes of non-face-to-face time during this encounter.   Comer LULLA Rouleau, NP

## 2023-12-06 NOTE — Patient Instructions (Signed)
 Continue to use CPAP every night, minimum of 4-6 hours a night.  Change equipment as directed. Wash your tubing with warm soap and water  daily, hang to dry. Wash humidifier portion weekly. Use bottled, distilled water  and change daily Be aware of reduced alertness and do not drive or operate heavy machinery if experiencing this or drowsiness.  Exercise encouraged, as tolerated. Healthy weight management discussed.  Avoid or decrease alcohol consumption and medications that make you more sleepy, if possible. Notify if persistent daytime sleepiness occurs even with consistent use of PAP therapy.  Change CPAP supplies... Every month Mask cushions and/or nasal pillows CPAP machine filters Every 3 months Mask frame (not including the headgear) CPAP tubing Every 6 months Mask headgear Chin strap (if applicable) Humidifier water  tub  Follow up in September 2026 for yearly follow up and to renew CPAP supplies, or sooner, if needed

## 2023-12-11 ENCOUNTER — Encounter: Payer: Self-pay | Admitting: Gastroenterology

## 2023-12-11 ENCOUNTER — Ambulatory Visit: Admitting: Gastroenterology

## 2023-12-11 VITALS — BP 113/72 | HR 67 | Temp 98.6°F | Ht 69.0 in | Wt 300.2 lb

## 2023-12-11 DIAGNOSIS — R7989 Other specified abnormal findings of blood chemistry: Secondary | ICD-10-CM

## 2023-12-11 DIAGNOSIS — K760 Fatty (change of) liver, not elsewhere classified: Secondary | ICD-10-CM

## 2023-12-11 NOTE — Progress Notes (Signed)
 GI Office Note    Referring Provider: Shona Norleen PEDLAR, MD Primary Care Physician:  Shona Norleen PEDLAR, MD  Primary Gastroenterologist: Ozell Hollingshead, MD   Chief Complaint   Chief Complaint  Patient presents with   Follow-up    Doing well, no issues    History of Present Illness   Nicholas Turner is a 53 y.o. male presenting today for follow up LFTs. Last seen 03/2023.   Discussed the use of AI scribe software for clinical note transcription with the patient, who gave verbal consent to proceed.  History of Present Illness Nicholas Turner is a 53 year old male who presents for follow-up of elevated liver enzymes.  He has a history of fluctuating elevated liver enzymes, specifically alkaline phosphatase, and lesser degree the AST/ALT. Recent blood work showed a temporary increase, which has since returned to baseline after repeat testing. He has undergone extensive workup including MRI and various blood tests to rule out other causes such as autoimmune liver diseases, iron overload, hepatitis B and C, and celiac disease. He has a history of fatty liver.  He experienced a recent episode of shingles approximately two weeks ago. The rash has resolved, but he continues to experience residual pain. He was treated with Valtrex, which helped mitigate the severity of the rash.  He has a history of weight fluctuations, with his heaviest being 365 pounds over five years ago. He is currently working on weight loss and reports walking on a treadmill daily, although he has reduced activity recently due to shingles pain.  He experiences occasional constipation, describing it as 'not going enough' and feeling incomplete after bowel movements. He has not yet tried any treatments but is considering natural remedies like prune juice.  He has a history of diabetes, with a recent A1c of 6.1, which increased after medication adjustments. He is currently on metformin . He also takes fish oil and vitamin D  supplements.  He drinks decaffeinated coffee due to sensitivity to caffeine, consuming about one cup two to three times a week.    Wt Readings from Last 3 Encounters:  12/11/23 (!) 300 lb 3.2 oz (136.2 kg)  11/21/23 300 lb (136.1 kg)  05/09/23 298 lb 6 oz (135.3 kg)     Prior Data     Results        Latest Ref Rng & Units 10/21/2023    9:50 AM 04/19/2023   11:27 AM 12/10/2022    9:27 AM 09/27/2022   10:01 AM 05/16/2022    9:28 AM 09/10/2014    3:47 PM 03/27/2013    3:16 PM  Hepatic Function  Total Protein 6.0 - 8.5 g/dL 7.7  7.7  7.4  8.0  7.5  8.3  8.0   Albumin 3.8 - 4.9 g/dL 4.2  4.3  4.3  4.7  4.3  4.1  4.2   AST 0 - 40 IU/L 26  26  25   40  49  25  38   ALT 0 - 44 IU/L 29  20  30   37  61  28  41   Alk Phosphatase 47 - 123 IU/L 194  132  135  162  142  82  94   Total Bilirubin 0.0 - 1.2 mg/dL 0.3  0.3  0.3  0.4  0.3  0.5  0.5   Bilirubin, Direct 0.00 - 0.40 mg/dL 9.85  9.87  9.87  9.88  0.14   0.1     Labs  from November 27, 2023 by PCP: Glucose 160, creatinine 1.44, sodium 136, potassium 4.4, alkaline phosphatase 139H, total bilirubin 0.5, AST 24, ALT 22, total cholesterol 156, LDL 91, A1c 6.1, PSA 2.5, total CK122, white blood cell count 7.3, hemoglobin 15.4, platelets 181  Labs from October 21, 2023: Total bilirubin 0.3, alk phos 194, AST 26, ALT 20 April 2023: H. pylori stool antigen negative  Labs 11/2022: LFTs normal except alk phos of 135. Intestinal fraction elevated at 31% high. Liver normal at 43, bone normal at 26. GGT 40, normal.    Labs in 04/2022: iron labs normal, celiac serologies neg, Hep B and C neg. Not immune to Hep A or B, autoimmune markers negative. Immunoglobulins normal. AP 142, AST 49, ALT 61, Tbili 0.3   MRI Abd with and without contrast 01/2023: IMPRESSION: Stable benign Bosniak category 1 and 2 bilateral renal cysts (No followup imaging is recommended). No evidence of renal neoplasm or hydronephrosis.   Abd u/s 05/2022: IMPRESSION: 1. The  study is limited due to patient body habitus and bowel gas shadowing. 2. Diffuse increased echogenicity throughout the liver is nonspecific and may be seen with hepatic steatosis or other underlying intrinsic liver disease. 3. The 2 cm hypoechoic mass in the left kidney is favored to represent a complicated cyst. An MRI of the abdomen with and without contrast could definitively characterize. If MRI is not pursued, six-month follow-up ultrasound to ensure stability would be recommended. 4. No other abnormalities.   Colonoscopy 05/2022: -hemorrhoids found on perianal exam -nonbleeding external and internal hemorrhoids, mild, small and Grade II -diverticulosis entire examined colon -repeat colonoscopy 7 years  Colonoscopy May 2019: -Many small and large mouth diverticula in the entire colon -Repeat colonoscopy in 5 years for surveillance, prior history of tubular adenoma   CT A/P with contrast 2015: normal liver        Medications   Current Outpatient Medications  Medication Sig Dispense Refill   allopurinol (ZYLOPRIM) 300 MG tablet Take 300 mg by mouth daily.     atorvastatin  (LIPITOR) 40 MG tablet Take 1 tablet by mouth once daily 15 tablet 0   buPROPion (WELLBUTRIN XL) 300 MG 24 hr tablet Take 300 mg by mouth daily.     colchicine  0.6 MG tablet Take 0.6 mg by mouth daily.     Continuous Glucose Sensor (DEXCOM G7 SENSOR) MISC SMARTSIG:Every 10 Days     furosemide (LASIX) 20 MG tablet Take 20 mg by mouth daily as needed for fluid.     hydrALAZINE  (APRESOLINE ) 100 MG tablet Take 1 tablet (100 mg total) by mouth 3 (three) times daily. 270 tablet 3   JARDIANCE 25 MG TABS tablet Take 25 mg by mouth daily.     metFORMIN  (GLUCOPHAGE -XR) 500 MG 24 hr tablet Take 500 mg by mouth daily.     metoprolol  tartrate (LOPRESSOR ) 100 MG tablet Take 1 tablet (100 mg total) by mouth 2 (two) times daily. 180 tablet 3   NIFEdipine (ADALAT CC) 30 MG 24 hr tablet Take 30 mg by mouth daily.      olmesartan  (BENICAR ) 40 MG tablet Take 1 tablet (40 mg total) by mouth daily with lunch. 90 tablet 3   SOLIQUA 100-33 UNT-MCG/ML SOPN Inject 42 Units into the skin at bedtime.     spironolactone (ALDACTONE) 25 MG tablet Take 12.5 mg by mouth daily.     valACYclovir (VALTREX) 1000 MG tablet Take 1,000 mg by mouth 2 (two) times daily.     No  current facility-administered medications for this visit.    Allergies   Allergies as of 12/11/2023 - Review Complete 12/11/2023  Allergen Reaction Noted   Clonidine derivatives Other (See Comments) 05/10/2010   Codeine  12/11/2023   Oxycodone  Nausea And Vomiting 09/01/2010       Review of Systems   General: Negative for anorexia, weight loss, fever, chills, fatigue, weakness. ENT: Negative for hoarseness, difficulty swallowing , nasal congestion. CV: Negative for chest pain, angina, palpitations, dyspnea on exertion, peripheral edema.  Respiratory: Negative for dyspnea at rest, dyspnea on exertion, cough, sputum, wheezing.  GI: See history of present illness. Denies dysphagia, heartburn, abdominal pain, n/v. GU:  Negative for dysuria, hematuria, urinary incontinence, urinary frequency, nocturnal urination.  Endo: Negative for unusual weight change.     Physical Exam   BP 113/72   Pulse 67   Temp 98.6 F (37 C) (Oral)   Ht 5' 9 (1.753 m)   Wt (!) 300 lb 3.2 oz (136.2 kg)   SpO2 94%   BMI 44.33 kg/m    General: Well-nourished, well-developed in no acute distress.  Eyes: No icterus. Mouth: Oropharyngeal mucosa moist and pink   Lungs: Clear to auscultation bilaterally.  Heart: Regular rate and rhythm, no murmurs rubs or gallops.  Abdomen: Bowel sounds are normal, nontender, nondistended, no hepatosplenomegaly or masses,  no abdominal bruits or hernia , no rebound or guarding. Limited by body habitus. Rectal: not performed Extremities: No lower extremity edema. No clubbing or deformities. Neuro: Alert and oriented x 4   Skin: Warm and  dry, no jaundice.   Psych: Alert and cooperative, normal mood and affect.  Labs   See above Imaging Studies   No results found.  Assessment/Plan:   Assessment and Plan Assessment & Plan Fatty liver disease with persistently elevated liver enzymes Recent labs show normal AST/ALT but persistent elevation of AP although improved from 09/2023. Previous evaluations ruled out autoimmune processes, iron overload, hepatitis B and C, and celiac disease. [name]'s disease and alpha-1 antitrypsin deficiency have not been ruled out. Current management involves surveillance due to stable enzyme levels. Discussed potential impact of medications and supplements on liver function. Encouraged weight management and exercise to improve liver health. Discussed potential benefits of coffee as an anti-inflammatory for the liver. - Order [name]'s disease and alpha-1 antitrypsin deficiency tests with next labs. - Continue surveillance of liver enzyme levels. - FIB 4 1.53 needs further evaluation to rule out advanced fibrosis. MRI liver in 01/2023 unremarkable. NAFLD score is 1.64 (F3-F4). His BMI is too high for Fibroscan. Will obtain additional labs in near future. - Encouraged weight management (10% reduction) and regular exercise. Goal of 300 minutes per week. - Advised against over-the-counter supplements unless approved by a provider.  Constipation Intermittent constipation with incomplete bowel movements. - Consider adding fiber supplements such as benefiber one tbsp daily, increase to twice daily after first week if needed and no significant gas/bloating -eat 5 servings of vegetables daily - Consider Miralax 17g daily as needed        Sonny RAMAN. Ezzard, MHS, PA-C [facility]

## 2023-12-11 NOTE — Patient Instructions (Signed)
 For your bowel movements: -To increase complete stools, try increasing dietary fiber, consuming at least 5 servings of vegetables daily. Try eating one golden kiwi daily, including the skin.  -you can add fiber supplement such as benefiber one tablespoon daily, may increase to twice daily after first week if no significant bloating and gas -if needed you can add miralax one capful daily. You have to be consistent with miralax for it to work.  For fatty liver/elevated liver labs: -We will plan to repeat your liver labs in 3-4 months. If you are able, try to get labs done fasting. -Try adding two cups of coffee daily, little sweetener ok, little cream (go lite). -Continue daily exercise, with goal of 300 minutes per week. -Continue towards weight reduction, goal of 10 percent loss of body weight. Try to lose weight no faster than 1-2 pounds per week.  -Avoid fatty foods, sweets, sugary drinks, alcohol.

## 2023-12-18 ENCOUNTER — Other Ambulatory Visit: Payer: Self-pay

## 2023-12-18 ENCOUNTER — Telehealth: Payer: Self-pay | Admitting: Gastroenterology

## 2023-12-18 DIAGNOSIS — R7989 Other specified abnormal findings of blood chemistry: Secondary | ICD-10-CM

## 2023-12-18 DIAGNOSIS — K760 Fatty (change of) liver, not elsewhere classified: Secondary | ICD-10-CM

## 2023-12-18 NOTE — Telephone Encounter (Signed)
 Patient will need labs in 01/2024: CBC, CMET Alpha 1 antitrypsin phenotype Ceruloplasmin ELF  Please let him know that I suspect he has some significant fibrosis in the liver when is used some of his labs from this month to calculate fibrosis risk.   Very important for him to continue exercise and try to decrease weight by 10% (1-2 pounds per week), control diabetes. No etoh. Avoid high fat foods and sweets.

## 2023-12-18 NOTE — Telephone Encounter (Signed)
 Pt was made aware and verbalized understanding. Labs were ordered and will be mailed to the pt in Jan to have completed.

## 2024-01-09 ENCOUNTER — Other Ambulatory Visit: Payer: Self-pay

## 2024-01-09 DIAGNOSIS — K760 Fatty (change of) liver, not elsewhere classified: Secondary | ICD-10-CM

## 2024-01-09 DIAGNOSIS — R7989 Other specified abnormal findings of blood chemistry: Secondary | ICD-10-CM

## 2024-01-14 DIAGNOSIS — E1165 Type 2 diabetes mellitus with hyperglycemia: Secondary | ICD-10-CM | POA: Diagnosis not present

## 2024-01-14 DIAGNOSIS — G4733 Obstructive sleep apnea (adult) (pediatric): Secondary | ICD-10-CM | POA: Diagnosis not present

## 2024-02-01 LAB — CBC WITH DIFFERENTIAL/PLATELET
Basophils Absolute: 0.1 x10E3/uL (ref 0.0–0.2)
Basos: 1 %
EOS (ABSOLUTE): 0.3 x10E3/uL (ref 0.0–0.4)
Eos: 5 %
Hematocrit: 47.5 % (ref 37.5–51.0)
Hemoglobin: 15 g/dL (ref 13.0–17.7)
Immature Grans (Abs): 0 x10E3/uL (ref 0.0–0.1)
Immature Granulocytes: 0 %
Lymphocytes Absolute: 1.9 x10E3/uL (ref 0.7–3.1)
Lymphs: 35 %
MCH: 31.1 pg (ref 26.6–33.0)
MCHC: 31.6 g/dL (ref 31.5–35.7)
MCV: 98 fL — ABNORMAL HIGH (ref 79–97)
Monocytes Absolute: 0.4 x10E3/uL (ref 0.1–0.9)
Monocytes: 8 %
Neutrophils Absolute: 2.8 x10E3/uL (ref 1.4–7.0)
Neutrophils: 51 %
Platelets: 199 x10E3/uL (ref 150–450)
RBC: 4.83 x10E6/uL (ref 4.14–5.80)
RDW: 12.9 % (ref 11.6–15.4)
WBC: 5.5 x10E3/uL (ref 3.4–10.8)

## 2024-02-01 LAB — COMPREHENSIVE METABOLIC PANEL WITH GFR
ALT: 24 IU/L (ref 0–44)
AST: 23 IU/L (ref 0–40)
Albumin: 4.5 g/dL (ref 3.8–4.9)
Alkaline Phosphatase: 123 IU/L (ref 47–123)
BUN/Creatinine Ratio: 15 (ref 9–20)
BUN: 23 mg/dL (ref 6–24)
Bilirubin Total: 0.4 mg/dL (ref 0.0–1.2)
CO2: 18 mmol/L — ABNORMAL LOW (ref 20–29)
Calcium: 9.9 mg/dL (ref 8.7–10.2)
Chloride: 103 mmol/L (ref 96–106)
Creatinine, Ser: 1.52 mg/dL — ABNORMAL HIGH (ref 0.76–1.27)
Globulin, Total: 3.4 g/dL (ref 1.5–4.5)
Glucose: 187 mg/dL — ABNORMAL HIGH (ref 70–99)
Potassium: 4.5 mmol/L (ref 3.5–5.2)
Sodium: 140 mmol/L (ref 134–144)
Total Protein: 7.9 g/dL (ref 6.0–8.5)
eGFR: 54 mL/min/1.73 — ABNORMAL LOW

## 2024-02-01 LAB — ALPHA-1-ANTITRYPSIN PHENOTYP: A-1 Antitrypsin: 135 mg/dL (ref 101–187)

## 2024-02-01 LAB — CERULOPLASMIN: Ceruloplasmin: 23.7 mg/dL (ref 16.0–31.0)

## 2024-02-01 LAB — ENHANCED LIVER FIBROSIS (ELF): ELF(TM) Score: 9.97 — AB

## 2024-02-11 ENCOUNTER — Other Ambulatory Visit: Payer: Self-pay | Admitting: Physician Assistant

## 2024-02-14 ENCOUNTER — Ambulatory Visit: Payer: Self-pay | Admitting: Gastroenterology

## 2024-02-26 ENCOUNTER — Other Ambulatory Visit: Payer: Self-pay | Admitting: Physician Assistant
